# Patient Record
Sex: Male | Born: 1935 | Race: White | Hispanic: No | Smoking: Never smoker
Health system: Southern US, Community
[De-identification: ages and names within clinical notes are randomized; demographics above are authoritative.]

## PROBLEM LIST (undated history)

## (undated) DIAGNOSIS — F32A Depression, unspecified: Secondary | ICD-10-CM

## (undated) DIAGNOSIS — F419 Anxiety disorder, unspecified: Secondary | ICD-10-CM

## (undated) DIAGNOSIS — M1990 Unspecified osteoarthritis, unspecified site: Secondary | ICD-10-CM

## (undated) DIAGNOSIS — E119 Type 2 diabetes mellitus without complications: Secondary | ICD-10-CM

## (undated) DIAGNOSIS — I059 Rheumatic mitral valve disease, unspecified: Secondary | ICD-10-CM

## (undated) DIAGNOSIS — E785 Hyperlipidemia, unspecified: Secondary | ICD-10-CM

## (undated) DIAGNOSIS — H409 Unspecified glaucoma: Secondary | ICD-10-CM

## (undated) DIAGNOSIS — I739 Peripheral vascular disease, unspecified: Secondary | ICD-10-CM

## (undated) DIAGNOSIS — F329 Major depressive disorder, single episode, unspecified: Secondary | ICD-10-CM

## (undated) DIAGNOSIS — I469 Cardiac arrest, cause unspecified: Secondary | ICD-10-CM

## (undated) DIAGNOSIS — D649 Anemia, unspecified: Secondary | ICD-10-CM

## (undated) DIAGNOSIS — I509 Heart failure, unspecified: Secondary | ICD-10-CM

## (undated) DIAGNOSIS — M792 Neuralgia and neuritis, unspecified: Secondary | ICD-10-CM

## (undated) DIAGNOSIS — I2510 Atherosclerotic heart disease of native coronary artery without angina pectoris: Secondary | ICD-10-CM

## (undated) DIAGNOSIS — I10 Essential (primary) hypertension: Secondary | ICD-10-CM

## (undated) HISTORY — DX: Unspecified glaucoma: H40.9

## (undated) HISTORY — PX: MITRAL VALVE ANNULOPLASTY: SHX2038

## (undated) HISTORY — DX: Anemia, unspecified: D64.9

## (undated) HISTORY — DX: Essential (primary) hypertension: I10

## (undated) HISTORY — DX: Major depressive disorder, single episode, unspecified: F32.9

## (undated) HISTORY — DX: Unspecified osteoarthritis, unspecified site: M19.90

## (undated) HISTORY — DX: Hyperlipidemia, unspecified: E78.5

## (undated) HISTORY — PX: CORONARY ARTERY BYPASS GRAFT: SHX141

## (undated) HISTORY — PX: CHOLECYSTECTOMY: SHX55

## (undated) HISTORY — DX: Rheumatic mitral valve disease, unspecified: I05.9

## (undated) HISTORY — DX: Type 2 diabetes mellitus without complications: E11.9

## (undated) HISTORY — DX: Heart failure, unspecified: I50.9

## (undated) HISTORY — DX: Cardiac arrest, cause unspecified: I46.9

## (undated) HISTORY — DX: Depression, unspecified: F32.A

## (undated) HISTORY — DX: Atherosclerotic heart disease of native coronary artery without angina pectoris: I25.10

## (undated) HISTORY — DX: Anxiety disorder, unspecified: F41.9

## (undated) HISTORY — PX: CORONARY ANGIOPLASTY WITH STENT PLACEMENT: SHX49

## (undated) HISTORY — DX: Neuralgia and neuritis, unspecified: M79.2

## (undated) HISTORY — DX: Peripheral vascular disease, unspecified: I73.9

---

## 1998-05-25 ENCOUNTER — Inpatient Hospital Stay (HOSPITAL_COMMUNITY): Admission: RE | Admit: 1998-05-25 | Discharge: 1998-06-01 | Payer: Self-pay | Admitting: Cardiothoracic Surgery

## 2005-02-03 ENCOUNTER — Ambulatory Visit: Payer: Self-pay | Admitting: Internal Medicine

## 2005-02-03 ENCOUNTER — Inpatient Hospital Stay: Payer: Self-pay | Admitting: Surgery

## 2007-06-27 ENCOUNTER — Ambulatory Visit: Payer: Self-pay | Admitting: Rheumatology

## 2008-12-15 ENCOUNTER — Ambulatory Visit: Payer: Self-pay | Admitting: General Practice

## 2009-02-16 ENCOUNTER — Ambulatory Visit: Payer: Self-pay | Admitting: Podiatry

## 2009-03-18 ENCOUNTER — Ambulatory Visit: Payer: Self-pay | Admitting: Cardiology

## 2009-03-21 ENCOUNTER — Inpatient Hospital Stay: Payer: Self-pay | Admitting: Internal Medicine

## 2009-12-22 ENCOUNTER — Emergency Department: Payer: Self-pay | Admitting: Emergency Medicine

## 2009-12-31 ENCOUNTER — Ambulatory Visit: Payer: Self-pay | Admitting: Internal Medicine

## 2010-01-26 ENCOUNTER — Ambulatory Visit: Payer: Self-pay | Admitting: Internal Medicine

## 2010-02-26 ENCOUNTER — Ambulatory Visit: Payer: Self-pay | Admitting: Internal Medicine

## 2010-11-16 ENCOUNTER — Ambulatory Visit: Payer: Self-pay | Admitting: Podiatry

## 2011-05-04 ENCOUNTER — Inpatient Hospital Stay: Payer: Self-pay | Admitting: Cardiology

## 2011-06-02 ENCOUNTER — Encounter: Payer: Self-pay | Admitting: Cardiology

## 2011-06-29 ENCOUNTER — Encounter: Payer: Self-pay | Admitting: Cardiology

## 2011-07-30 ENCOUNTER — Encounter: Payer: Self-pay | Admitting: Cardiology

## 2011-08-29 ENCOUNTER — Encounter: Payer: Self-pay | Admitting: Cardiology

## 2011-09-29 ENCOUNTER — Encounter: Payer: Self-pay | Admitting: Cardiology

## 2011-12-21 ENCOUNTER — Ambulatory Visit: Payer: Self-pay | Admitting: Ophthalmology

## 2012-01-25 ENCOUNTER — Ambulatory Visit: Payer: Self-pay | Admitting: Ophthalmology

## 2012-03-19 ENCOUNTER — Emergency Department: Payer: Self-pay | Admitting: Emergency Medicine

## 2013-11-06 ENCOUNTER — Ambulatory Visit: Payer: Self-pay | Admitting: Podiatry

## 2013-12-11 ENCOUNTER — Telehealth: Payer: Self-pay | Admitting: *Deleted

## 2013-12-11 MED ORDER — CLINDAMYCIN HCL 150 MG PO CAPS: 150.0000 mg | ORAL_CAPSULE | Freq: Three times a day (TID) | ORAL | Status: DC

## 2013-12-11 NOTE — Telephone Encounter (Signed)
[facility]ax request from [facility] requesting clindamycin #[phone] refills take one capsule three times a day for two weeks.

## 2014-03-26 DIAGNOSIS — I2510 Atherosclerotic heart disease of native coronary artery without angina pectoris: Secondary | ICD-10-CM | POA: Insufficient documentation

## 2014-03-26 DIAGNOSIS — I739 Peripheral vascular disease, unspecified: Secondary | ICD-10-CM | POA: Insufficient documentation

## 2014-04-08 ENCOUNTER — Other Ambulatory Visit: Payer: Self-pay | Admitting: Podiatry

## 2014-04-10 ENCOUNTER — Other Ambulatory Visit: Payer: Self-pay | Admitting: *Deleted

## 2014-04-10 MED ORDER — CLINDAMYCIN HCL 150 MG PO CAPS: 150.0000 mg | ORAL_CAPSULE | Freq: Three times a day (TID) | ORAL | Status: DC

## 2014-04-10 NOTE — Telephone Encounter (Signed)
Is this ok to refill?  

## 2014-04-10 NOTE — Telephone Encounter (Signed)
Test

## 2014-04-10 NOTE — Telephone Encounter (Signed)
bton pt

## 2014-04-11 NOTE — Telephone Encounter (Signed)
Lennox LaityJodi,  Please see if Dr Al CorpusHyatt wants to refill this.  Thanks   [employer]ery

## 2014-04-15 ENCOUNTER — Ambulatory Visit: Payer: Self-pay | Admitting: Internal Medicine

## 2014-04-30 ENCOUNTER — Encounter: Payer: Self-pay | Admitting: Internal Medicine

## 2014-05-28 ENCOUNTER — Other Ambulatory Visit: Payer: Self-pay | Admitting: Podiatry

## 2014-05-28 NOTE — Telephone Encounter (Signed)
Pt needs an appt, if currently having problem with an infection and prior to refills.

## 2014-07-01 ENCOUNTER — Ambulatory Visit (INDEPENDENT_AMBULATORY_CARE_PROVIDER_SITE_OTHER): Payer: Medicare Other | Admitting: Internal Medicine

## 2014-07-01 ENCOUNTER — Encounter: Payer: Self-pay | Admitting: Internal Medicine

## 2014-07-01 VITALS — BP 118/70 | HR 86 | Ht 68.0 in | Wt 232.2 lb

## 2014-07-01 DIAGNOSIS — I2510 Atherosclerotic heart disease of native coronary artery without angina pectoris: Secondary | ICD-10-CM

## 2014-07-01 DIAGNOSIS — K589 Irritable bowel syndrome without diarrhea: Secondary | ICD-10-CM

## 2014-07-01 DIAGNOSIS — K219 Gastro-esophageal reflux disease without esophagitis: Secondary | ICD-10-CM

## 2014-07-01 DIAGNOSIS — Z1211 Encounter for screening for malignant neoplasm of colon: Secondary | ICD-10-CM

## 2014-07-01 NOTE — Progress Notes (Signed)
HISTORY OF PRESENT ILLNESS:  Jeffrey Mills is a 78 y.o. male with multiple significant medical problems including, but not limited to, coronary artery disease status post CABG with mitral valve repair, long-standing insulin dependent diabetes mellitus with neuropathy, history of congestive heart failure, history of coronary artery stent placement 2010 and again 2012 complicated by cardiopulmonary arrest. He is on chronic Plavix therapy. Chronic problems with dizziness and shortness of breath with minimal exertion. He is sent here today by his primary care provider Dr. Hyacinth Meeker regarding screening colonoscopy. The patient has been encouraged to have this over the years, but has not. Recently, it was suggested by his endocrinologist. He discussed this with his primary provider. The conclusion was "is probably a good idea". Patient has not had prior colonoscopy. Review of outside records includes an office note from Dr. Hyacinth Meeker 03/26/2014. Blood work at that time was unremarkable. Hemoglobin 13.0 with normal MCV. Normal comprehensive metabolic panel except for elevated glucose. The patient's GI review of systems is remarkable for chronic GERD without dysphagia for which he takes daily PPI. Good control of symptoms on medication. Next, he reports several year history of postprandial urgency with loose stools. For this he has been prescribed Levsin. GI review of systems is otherwise negative. He denies melena or hematochezia. No family history of colon cancer.  REVIEW OF SYSTEMS:  All non-GI ROS negative except for anxiety, arthritis, back pain, visual difficulties, depression, fatigue, irregular heart rate, heart murmur, itching, shortness of breath, ankle edema, skin rash, increased thirst, excessive urination  Past Medical History  Diagnosis Date  . Diabetes   . Benign hypertension   . Mitral valve disorder   . Anemia   . Hyperlipidemia   . CHF (congestive heart failure)   . Neuralgia   . Coronary  atherosclerosis of native coronary artery   . Cardiopulmonary arrest   . PVD (peripheral vascular disease)   . Anxiety   . Arthritis   . Depression   . Glaucoma     Past Surgical History  Procedure Laterality Date  . Coronary artery bypass graft    . Mitral valve annuloplasty    . Cholecystectomy    . Coronary angioplasty with stent placement      Social History JQUAN EGELSTON  reports that he has never smoked. He has never used smokeless tobacco. He reports that he does not drink alcohol or use illicit drugs.  family history includes ALS in his father; Breast cancer in his mother; Diabetes in his son; Heart attack in his paternal uncle; Heart disease in his father and son; Hypertension in his mother.  Allergies  Allergen Reactions  . Captopril   . Morphine And Related   . Penicillins        PHYSICAL EXAMINATION: Vital signs: BP 118/70  Pulse 86  Ht 5\' 8"  (1.727 m)  Wt 232 lb 4 oz (105.348 kg)  BMI 35.32 kg/m2  Constitutional: Frail elderly male, chronically ill-appearing, no acute distress Psychiatric: alert and oriented x3, cooperative Eyes: extraocular movements intact, anicteric, conjunctiva pink Mouth: oral pharynx moist, no lesions Neck: supple no lymphadenopathy Cardiovascular: heart regular rate and rhythm, no murmur Lungs: clear to auscultation bilaterally Abdomen: soft, nontender, nondistended, no obvious ascites, no peritoneal signs, normal bowel sounds, no organomegaly Rectal: Omitted Extremities: Trace lower extremity edema bilaterally Skin: no lesions on visible extremities Neuro: No focal deficits.     ASSESSMENT:  #1. Colon cancer screening. Poor candidate for optical colonoscopy given his age and comorbidities. The  risk is too high. We did discuss several alternatives. See below #2. Chronic GERD without alarm features. Symptoms controlled with PPI #3. IBS-type symptoms. Managed with antispasmodics as needed #4. MULTIPLE significant medical  problems   PLAN:  #1.Cologard as a screening tool for advanced neoplasia of the colon. If this is negative, no further screening strategies recommended in this 78 year old with advanced comorbidities. If positive, would. proceed to virtual colonoscopy to exclude significant lesions. The patient understands this strategy and agrees #2. Continue PPI #3. Continue antispasmodics as needed #4. Return to the care of your primary providers regarding management of your chronic medical problems

## 2014-07-01 NOTE — Patient Instructions (Signed)
You will be contacted shortly by [facility] to set up your Cologuard test.

## 2014-07-07 ENCOUNTER — Other Ambulatory Visit: Payer: Self-pay | Admitting: Podiatry

## 2014-07-23 LAB — COLOGUARD: Cologuard: POSITIVE

## 2014-08-11 ENCOUNTER — Telehealth: Payer: Self-pay | Admitting: *Deleted

## 2014-08-11 DIAGNOSIS — I25810 Atherosclerosis of coronary artery bypass graft(s) without angina pectoris: Secondary | ICD-10-CM | POA: Insufficient documentation

## 2014-08-11 DIAGNOSIS — I10 Essential (primary) hypertension: Secondary | ICD-10-CM | POA: Insufficient documentation

## 2014-08-11 NOTE — Telephone Encounter (Signed)
Yes one refill

## 2014-08-11 NOTE — Telephone Encounter (Signed)
Refilled clindamycin per dr Al Corpus.

## 2014-08-11 NOTE — Telephone Encounter (Signed)
Pt is wanting more clindamycin. Is this ok?

## 2014-08-12 ENCOUNTER — Telehealth: Payer: Self-pay

## 2014-08-12 ENCOUNTER — Other Ambulatory Visit: Payer: Self-pay

## 2014-08-12 DIAGNOSIS — K635 Polyp of colon: Secondary | ICD-10-CM

## 2014-08-12 NOTE — Telephone Encounter (Signed)
Pt scheduled for virtual colon at [address] 09/01/14. Pt to arrive there at 7:45am for an 8am appt. Pt to pick up prep at least 3 days prior to exam. Pt aware.

## 2014-08-12 NOTE — Telephone Encounter (Signed)
Message copied by Chrystie Nose on Tue Aug 12, 2014  3:11 PM ------      Message from: Hilarie Fredrickson      Created: Mon Aug 11, 2014  1:38 PM      Regarding: Positive Cologaurd       Bonita Quin,            Please let the patient know that his cologard test returned positive. This means that he could possibly have a significant colon polyp. The next step  for him would be virtual colonoscopy. Please arrange. Please convert to a phone note for the record. Thank you            Dr. Marina Goodell ------

## 2014-08-15 ENCOUNTER — Encounter: Payer: Self-pay | Admitting: Internal Medicine

## 2014-09-01 ENCOUNTER — Ambulatory Visit
Admission: RE | Admit: 2014-09-01 | Discharge: 2014-09-01 | Disposition: A | Discharge status: Home / Self Care | Payer: Medicare Other | Source: Ambulatory Visit | Attending: Internal Medicine | Admitting: Internal Medicine

## 2014-09-01 DIAGNOSIS — K635 Polyp of colon: Secondary | ICD-10-CM

## 2014-09-09 ENCOUNTER — Other Ambulatory Visit: Payer: Self-pay | Admitting: Podiatry

## 2014-11-05 ENCOUNTER — Other Ambulatory Visit: Payer: Self-pay | Admitting: Podiatry

## 2014-11-17 DIAGNOSIS — E782 Mixed hyperlipidemia: Secondary | ICD-10-CM | POA: Insufficient documentation

## 2015-06-04 ENCOUNTER — Encounter: Payer: Self-pay | Admitting: Urgent Care

## 2015-06-04 ENCOUNTER — Emergency Department: Payer: Medicare Other

## 2015-06-04 ENCOUNTER — Emergency Department
Admission: EM | Admit: 2015-06-04 | Discharge: 2015-06-04 | Disposition: A | Discharge status: Home / Self Care | Payer: Medicare Other | Attending: Emergency Medicine | Admitting: Emergency Medicine

## 2015-06-04 DIAGNOSIS — I10 Essential (primary) hypertension: Secondary | ICD-10-CM | POA: Diagnosis not present

## 2015-06-04 DIAGNOSIS — W16212A Fall in (into) filled bathtub causing other injury, initial encounter: Secondary | ICD-10-CM | POA: Diagnosis not present

## 2015-06-04 DIAGNOSIS — S300XXA Contusion of lower back and pelvis, initial encounter: Secondary | ICD-10-CM | POA: Diagnosis not present

## 2015-06-04 DIAGNOSIS — Z880 Allergy status to penicillin: Secondary | ICD-10-CM | POA: Insufficient documentation

## 2015-06-04 DIAGNOSIS — Y9389 Activity, other specified: Secondary | ICD-10-CM | POA: Insufficient documentation

## 2015-06-04 DIAGNOSIS — Y998 Other external cause status: Secondary | ICD-10-CM | POA: Diagnosis not present

## 2015-06-04 DIAGNOSIS — E119 Type 2 diabetes mellitus without complications: Secondary | ICD-10-CM | POA: Diagnosis not present

## 2015-06-04 DIAGNOSIS — Z7982 Long term (current) use of aspirin: Secondary | ICD-10-CM | POA: Insufficient documentation

## 2015-06-04 DIAGNOSIS — S39012A Strain of muscle, fascia and tendon of lower back, initial encounter: Secondary | ICD-10-CM | POA: Diagnosis not present

## 2015-06-04 DIAGNOSIS — Z7902 Long term (current) use of antithrombotics/antiplatelets: Secondary | ICD-10-CM | POA: Diagnosis not present

## 2015-06-04 DIAGNOSIS — Z794 Long term (current) use of insulin: Secondary | ICD-10-CM | POA: Insufficient documentation

## 2015-06-04 DIAGNOSIS — S3992XA Unspecified injury of lower back, initial encounter: Secondary | ICD-10-CM | POA: Diagnosis present

## 2015-06-04 DIAGNOSIS — Z79899 Other long term (current) drug therapy: Secondary | ICD-10-CM | POA: Insufficient documentation

## 2015-06-04 DIAGNOSIS — Y9289 Other specified places as the place of occurrence of the external cause: Secondary | ICD-10-CM | POA: Diagnosis not present

## 2015-06-04 DIAGNOSIS — S20221A Contusion of right back wall of thorax, initial encounter: Secondary | ICD-10-CM

## 2015-06-04 NOTE — ED Notes (Addendum)
Patient presents with c/o lower back pain since last Thursday. Patient reporting that he fell in the bathtub - "I didn't think it was bad until today."  Patient reports that he took a half of a pain pill - states, "I dont normally take them because I sleep the entire next day." Patient with unsteady gait noted in triage - fall risk increased to high.

## 2015-06-04 NOTE — ED Provider Notes (Signed)
[facility] Emergency Department Provider Note  ____________________________________________  Time seen: 7:39 AM  I have reviewed the triage vital signs and the nursing notes.   HISTORY  Chief Complaint Fall and Back Pain   HPI Jeffrey Mills is a 79 y.o. male is here complaining of low back pain. He states he fell last Thursday in the bathtub. He states that he thought he was doing fine until today. The pain has increased with movement and made it difficult to sleep. He states he took a half a pain pill but does not know the name of it. He states he has had times with his back in the past and was [place] in [place] and scheduled for surgery. This surgery need to be canceled due to some new "heart problems". And since then he has not had much problems with his back per patient. He denies any loss of bowel or bladder control. There is no radiation into his legs. And he said no difficulty walking other than causing more pain in his back. At the time of his fall he denies any head injury or loss of consciousness. Currently his pain is 9/10   Past Medical History  Diagnosis Date  . Diabetes   . Benign hypertension   . Mitral valve disorder   . Anemia   . Hyperlipidemia   . CHF (congestive heart failure)   . Neuralgia   . Coronary atherosclerosis of native coronary artery   . Cardiopulmonary arrest   . PVD (peripheral vascular disease)   . Anxiety   . Arthritis   . Depression   . Glaucoma     There are no active problems to display for this patient.   Past Surgical History  Procedure Laterality Date  . Coronary artery bypass graft    . Mitral valve annuloplasty    . Cholecystectomy    . Coronary angioplasty with stent placement      Current Outpatient Rx  Name  Route  Sig  Dispense  Refill  . acetaminophen (TYLENOL) 325 MG tablet   Oral   Take 650 mg by mouth every 6 (six) hours as needed.         . ALPRAZolam (XANAX) 0.5 MG  tablet   Oral   Take 0.5 mg by mouth at bedtime as needed for anxiety.         Marland Kitchen. aspirin 81 MG tablet   Oral   Take 81 mg by mouth daily.         . brimonidine (ALPHAGAN) 0.2 % ophthalmic solution   Both Eyes   Place 1 drop into both eyes 2 (two) times daily.         Marland Kitchen. buPROPion (WELLBUTRIN XL) 150 MG 24 hr tablet   Oral   Take 150 mg by mouth daily.         . clindamycin (CLEOCIN) 150 MG capsule      TAKE 1 CAPSULE BY MOUTH 3 TIMES DAILY   42 capsule   0     FOR NEXT FILL. THANK YOU   . clopidogrel (PLAVIX) 75 MG tablet   Oral   Take 75 mg by mouth daily.         . hyoscyamine (LEVSIN, ANASPAZ) 0.125 MG tablet   Oral   Take 0.125 mg by mouth 3 (three) times daily as needed.         . insulin lispro protamine-lispro (HUMALOG 75/25 MIX) (75-25) 100 UNIT/ML SUSP injection  Subcutaneous   Inject 38 Units into the skin 2 (two) times daily with a meal.          . insulin regular (NOVOLIN R) 100 units/mL injection   Subcutaneous   Inject into the skin as directed. Sliding Scale dose         . Insulin Syringe-Needle U-100 (INSULIN SYRINGE .3CC/31GX5/16") 31G X 5/16" 0.3 ML MISC   Does not apply   by Does not apply route.         . lansoprazole (PREVACID) 30 MG capsule   Oral   Take 30 mg by mouth daily at 12 noon.         . latanoprost (XALATAN) 0.005 % ophthalmic solution   Both Eyes   Place 1 drop into both eyes 2 (two) times daily.          . Liraglutide (VICTOZA [place])   Subcutaneous   Inject 1.8 mg into the skin daily.          . metFORMIN (GLUCOPHAGE) 500 MG tablet   Oral   Take 500 mg by mouth daily.         . metoprolol succinate (TOPROL-XL) 25 MG 24 hr tablet   Oral   Take 25 mg by mouth daily.         . sertraline (ZOLOFT) 50 MG tablet   Oral   Take 50 mg by mouth daily.         . simvastatin (ZOCOR) 40 MG tablet   Oral   Take 40 mg by mouth daily.           Allergies Captopril; Morphine and related; and  Penicillins  Family History  Problem Relation Age of Onset  . Heart disease Father   . Hypertension Mother   . Heart attack Paternal Uncle   . Breast cancer Mother   . Diabetes Son   . Heart disease Son   . ALS Father     Social History History  Substance Use Topics  . Smoking status: Never Smoker   . Smokeless tobacco: Never Used  . Alcohol Use: No    Review of Systems Constitutional: No fever/chills Eyes: No visual changes. ENT: No sore throat. Cardiovascular: Denies chest pain. Respiratory: Denies shortness of breath. Gastrointestinal: No abdominal pain.  No nausea, no vomiting.  No diarrhea.  No constipation. Genitourinary: Negative for dysuria. Musculoskeletal: Positive for back pain. Skin: Negative for rash. Neurological: Negative for headaches, focal weakness or numbness.  10-point ROS otherwise negative.  ____________________________________________   PHYSICAL EXAM:  VITAL SIGNS: ED Triage Vitals  Enc Vitals Group     BP 06/04/15 0219 124/84 mmHg     Pulse Rate 06/04/15 0219 99     Resp 06/04/15 0219 18     Temp 06/04/15 0219 97.4 F (36.3 C)     Temp Source 06/04/15 0219 Oral     SpO2 06/04/15 0219 96 %     Weight 06/04/15 0219 232 lb (105.235 kg)     Height 06/04/15 0219 5\' 11"  (1.803 m)     Head Cir --      Peak Flow --      Pain Score 06/04/15 0219 9     Pain Loc --      Pain Edu? --      Excl. in GC? --     Constitutional: Alert and oriented. Well appearing and in no acute distress. Eyes: Conjunctivae are normal. PERRL. EOMI. Head: Atraumatic. Nose: No congestion/rhinnorhea. Neck: No  stridor.  No cervical spine tenderness on palpation. Cardiovascular: Normal rate, regular rhythm. Grossly normal heart sounds.  Good peripheral circulation. Respiratory: Normal respiratory effort.  No retractions. Lungs CTAB. Gastrointestinal: Soft and nontender. No distention.. Musculoskeletal: Back exam no gross deformity noted. Moderate tenderness on  palpation of the L5-S1 paravertebral muscles bilaterally. Range of motion is slightly restricted secondary to pain. There is good muscle strength bilateral legs. Straight leg raises are approximately 70 bilaterally with minimal discomfort. No lower extremity tenderness nor edema.  No joint effusions. Neurologic:  Normal speech and language. No gross focal neurologic deficits are appreciated. Speech is normal. No gait instability. Reflexes are 1+ bilaterally Skin:  Skin is warm, dry and intact. No rash noted. No ecchymosis or abrasions noted Psychiatric: Mood and affect are normal. Speech and behavior are normal.  ____________________________________________   LABS (all labs ordered are listed, but only abnormal results are displayed)  Labs Reviewed - No data to display   RADIOLOGY  X-ray lumbar spine shows mild degenerative disc disease changes at L4-5 and unchanged prior to previous studies per radiologist I, Tommi Rumps, personally viewed and evaluated these images as part of my medical decision making.  ____________________________________________   PROCEDURES  Procedure(s) performed: None  Critical Care performed: No  ____________________________________________   INITIAL IMPRESSION / ASSESSMENT AND PLAN / ED COURSE  Pertinent labs & imaging results that were available during my care of the patient were reviewed by me and considered in my medical decision making (see chart for details  patient will continue taking his pain medication prescribed by his doctor in [place]. He is to follow-up with his doctor if any continued problems. He was also cautioned that his medication may also stress numbness and increase his risk for fall.____________________________________________   FINAL CLINICAL IMPRESSION(S) / ED DIAGNOSES  Final diagnoses:  Low back strain, initial encounter  Contusion, back, right, initial encounter      Tommi Rumps, PA-C 06/04/15  1141  Sharman Cheek, MD 06/04/15 1535

## 2015-06-04 NOTE — Discharge Instructions (Signed)
° °  CONTINUE YOUR REGULAR MEDICATION AND FOLLOW UP WITH YOUR DOCTOR AS NEEDED ICE TO BACK OR MOIST HEAT AS NEEDED  TAKE 1/2 TABLET OF YOUR PAIN MEDICATION AND 1 TABLET AT BEDTIME AS NEEDED FOR PAIN

## 2015-12-08 DIAGNOSIS — E1121 Type 2 diabetes mellitus with diabetic nephropathy: Secondary | ICD-10-CM | POA: Insufficient documentation

## 2016-04-11 DIAGNOSIS — F3341 Major depressive disorder, recurrent, in partial remission: Secondary | ICD-10-CM | POA: Insufficient documentation

## 2016-08-16 DIAGNOSIS — E1140 Type 2 diabetes mellitus with diabetic neuropathy, unspecified: Secondary | ICD-10-CM | POA: Insufficient documentation

## 2017-03-20 DIAGNOSIS — I340 Nonrheumatic mitral (valve) insufficiency: Secondary | ICD-10-CM | POA: Insufficient documentation

## 2017-04-19 DIAGNOSIS — Z0000 Encounter for general adult medical examination without abnormal findings: Secondary | ICD-10-CM | POA: Insufficient documentation

## 2017-07-24 ENCOUNTER — Emergency Department
Admission: EM | Admit: 2017-07-24 | Discharge: 2017-07-24 | Disposition: A | Discharge status: Home / Self Care | Payer: Medicare Other | Attending: Emergency Medicine | Admitting: Emergency Medicine

## 2017-07-24 DIAGNOSIS — F419 Anxiety disorder, unspecified: Secondary | ICD-10-CM | POA: Diagnosis not present

## 2017-07-24 DIAGNOSIS — R0602 Shortness of breath: Secondary | ICD-10-CM | POA: Diagnosis not present

## 2017-07-24 DIAGNOSIS — Z7901 Long term (current) use of anticoagulants: Secondary | ICD-10-CM | POA: Diagnosis not present

## 2017-07-24 DIAGNOSIS — I509 Heart failure, unspecified: Secondary | ICD-10-CM | POA: Diagnosis not present

## 2017-07-24 DIAGNOSIS — R451 Restlessness and agitation: Secondary | ICD-10-CM | POA: Diagnosis present

## 2017-07-24 DIAGNOSIS — Z7982 Long term (current) use of aspirin: Secondary | ICD-10-CM | POA: Insufficient documentation

## 2017-07-24 DIAGNOSIS — I110 Hypertensive heart disease with heart failure: Secondary | ICD-10-CM | POA: Insufficient documentation

## 2017-07-24 DIAGNOSIS — Z79899 Other long term (current) drug therapy: Secondary | ICD-10-CM | POA: Insufficient documentation

## 2017-07-24 DIAGNOSIS — E119 Type 2 diabetes mellitus without complications: Secondary | ICD-10-CM | POA: Diagnosis not present

## 2017-07-24 DIAGNOSIS — I2510 Atherosclerotic heart disease of native coronary artery without angina pectoris: Secondary | ICD-10-CM | POA: Diagnosis not present

## 2017-07-24 DIAGNOSIS — Z794 Long term (current) use of insulin: Secondary | ICD-10-CM | POA: Insufficient documentation

## 2017-07-24 LAB — CBC WITH DIFFERENTIAL/PLATELET
BASOS PCT: 0 %
Basophils Absolute: 0 10*3/uL (ref 0–0.1)
EOS ABS: 0.2 10*3/uL (ref 0–0.7)
EOS PCT: 3 %
HCT: 34.2 % — ABNORMAL LOW (ref 40.0–52.0)
Hemoglobin: 11.9 g/dL — ABNORMAL LOW (ref 13.0–18.0)
Lymphocytes Relative: 24 %
Lymphs Abs: 1.7 10*3/uL (ref 1.0–3.6)
MCH: 34.7 pg — ABNORMAL HIGH (ref 26.0–34.0)
MCHC: 34.7 g/dL (ref 32.0–36.0)
MCV: 99.9 fL (ref 80.0–100.0)
MONO ABS: 0.7 10*3/uL (ref 0.2–1.0)
MONOS PCT: 9 %
NEUTROS PCT: 64 %
Neutro Abs: 4.5 10*3/uL (ref 1.4–6.5)
PLATELETS: 237 10*3/uL (ref 150–440)
RBC: 3.42 MIL/uL — ABNORMAL LOW (ref 4.40–5.90)
RDW: 15.2 % — AB (ref 11.5–14.5)
WBC: 7.1 10*3/uL (ref 3.8–10.6)

## 2017-07-24 LAB — BASIC METABOLIC PANEL
Anion gap: 9 (ref 5–15)
BUN: 23 mg/dL — AB (ref 6–20)
CO2: 23 mmol/L (ref 22–32)
Calcium: 9.4 mg/dL (ref 8.9–10.3)
Chloride: 107 mmol/L (ref 101–111)
Creatinine, Ser: 1.37 mg/dL — ABNORMAL HIGH (ref 0.61–1.24)
GFR calc Af Amer: 55 mL/min — ABNORMAL LOW (ref >60)
GFR, EST NON AFRICAN AMERICAN: 47 mL/min — AB (ref >60)
GLUCOSE: 179 mg/dL — AB (ref 65–99)
POTASSIUM: 4.7 mmol/L (ref 3.5–5.1)
Sodium: 139 mmol/L (ref 135–145)

## 2017-07-24 LAB — CK: Total CK: 97 U/L (ref 49–397)

## 2017-07-24 LAB — HEPATIC FUNCTION PANEL
ALK PHOS: 63 U/L (ref 38–126)
ALT: 26 U/L (ref 17–63)
AST: 35 U/L (ref 15–41)
Albumin: 4.3 g/dL (ref 3.5–5.0)
BILIRUBIN TOTAL: 0.7 mg/dL (ref 0.3–1.2)
Total Protein: 7.9 g/dL (ref 6.5–8.1)

## 2017-07-24 LAB — TROPONIN I: Troponin I: <0.03 ng/mL (ref <0.03)

## 2017-07-24 LAB — GLUCOSE, CAPILLARY: Glucose-Capillary: 174 mg/dL — ABNORMAL HIGH (ref 65–99)

## 2017-07-24 LAB — TSH: TSH: 3.742 u[IU]/mL (ref 0.350–4.500)

## 2017-07-24 LAB — T4, FREE: FREE T4: 0.72 ng/dL (ref 0.61–1.12)

## 2017-07-24 NOTE — ED Provider Notes (Signed)
[facility] Emergency Department Provider Note  ____________________________________________   First MD Initiated Contact with Patient 07/24/17 [phone]     (approximate)  I have reviewed the triage vital signs and the nursing notes.   HISTORY  Chief Complaint Panic Attack    HPI Jeffrey Mills is a 81 y.o. male who self presents to the emergency department with 4-5 days of nightly anxiety. He has never been formally diagnosed with panic attacks but says he thinks that that is what these are. Round 4 to 5 PM he begins to feel gradual onset nervousness and anxiety and a feeling of restlessness. He said the only thing that seems to help is getting in his car and driving. He has previously been prescribed alprazolam to help him sleep but he does not like taking it as he feels like it makes him groggy the next day. He has had no recent change in his medications aside from stopping Plavix in beginning eliquis. He has had some recent life stressors with a death in the family as well as 2 sick family members. His symptoms of his slowly progressive. There are improved during the day.   Past Medical History:  Diagnosis Date  . Anemia   . Anxiety   . Arthritis   . Benign hypertension   . Cardiopulmonary arrest (HCC)   . CHF (congestive heart failure) (HCC)   . Coronary atherosclerosis of native coronary artery   . Depression   . Diabetes (HCC)   . Glaucoma   . Hyperlipidemia   . Mitral valve disorder   . Neuralgia   . PVD (peripheral vascular disease) (HCC)     There are no active problems to display for this patient.   Past Surgical History:  Procedure Laterality Date  . CHOLECYSTECTOMY    . CORONARY ANGIOPLASTY WITH STENT PLACEMENT    . CORONARY ARTERY BYPASS GRAFT    . MITRAL VALVE ANNULOPLASTY      Prior to Admission medications   Medication Sig Start Date End Date Taking? Authorizing Provider  apixaban (ELIQUIS) 2.5 MG TABS tablet Take 2.5 mg by  mouth 2 (two) times daily.   Yes [provider]  acetaminophen (TYLENOL) 325 MG tablet Take 650 mg by mouth every 6 (six) hours as needed.    [provider]  ALPRAZolam Prudy Feeler) 0.5 MG tablet Take 0.5 mg by mouth at bedtime as needed for anxiety.    [provider]  aspirin 81 MG tablet Take 81 mg by mouth daily.    [provider]  brimonidine (ALPHAGAN) 0.2 % ophthalmic solution Place 1 drop into both eyes 2 (two) times daily.    [provider]  buPROPion (WELLBUTRIN XL) 150 MG 24 hr tablet Take 150 mg by mouth daily.    [provider]  clindamycin (CLEOCIN) 150 MG capsule TAKE 1 CAPSULE BY MOUTH 3 TIMES DAILY 11/24/14   Hyatt, Max T, DPM  clopidogrel (PLAVIX) 75 MG tablet Take 75 mg by mouth daily.    [provider]  hyoscyamine (LEVSIN, ANASPAZ) 0.125 MG tablet Take 0.125 mg by mouth 3 (three) times daily as needed.    [provider]  insulin lispro protamine-lispro (HUMALOG 75/25 MIX) (75-25) 100 UNIT/ML SUSP injection Inject 38 Units into the skin 2 (two) times daily with a meal.     [provider]  insulin regular (NOVOLIN R) 100 units/mL injection Inject into the skin as directed. Sliding Scale dose    [provider]  Insulin Syringe-Needle U-100 (INSULIN SYRINGE .3CC/31GX5/16") 31G X 5/16" 0.3 ML MISC by Does not apply route.    [provider]  lansoprazole (PREVACID) 30 MG capsule Take 30 mg by mouth daily at 12 noon.    [provider]  latanoprost (XALATAN) 0.005 % ophthalmic solution Place 1 drop into both eyes 2 (two) times daily.  05/28/14   [provider]  Liraglutide (VICTOZA [place]) Inject 1.8 mg into the skin daily.     [provider]  metFORMIN (GLUCOPHAGE) 500 MG tablet Take 500 mg by mouth daily.    [provider]  metoprolol succinate (TOPROL-XL) 25 MG 24 hr tablet Take 25 mg by mouth daily.    [provider]  sertraline (ZOLOFT)  50 MG tablet Take 50 mg by mouth daily.    [provider]  simvastatin (ZOCOR) 40 MG tablet Take 40 mg by mouth daily.    [provider]    Allergies Captopril; Morphine and related; and Penicillins  Family History  Problem Relation Age of Onset  . Heart disease Father   . ALS Father   . Hypertension Mother   . Breast cancer Mother   . Heart attack Paternal Uncle   . Diabetes Son   . Heart disease Son     Social History Social History  Substance Use Topics  . Smoking status: Never Smoker  . Smokeless tobacco: Never Used  . Alcohol use No    Review of Systems Constitutional: No fever/chills ENT: No sore throat. Cardiovascular: Denies chest pain. Respiratory: Positive shortness of breath. Gastrointestinal: No abdominal pain.  No nausea, no vomiting.  No diarrhea.  No constipation. Musculoskeletal: Negative for back pain. Neurological: Negative for headaches   ____________________________________________   PHYSICAL EXAM:  VITAL SIGNS: ED Triage Vitals  Enc Vitals Group     BP 07/24/17 0153 (!) 152/77     Pulse Rate 07/24/17 0153 85     Resp 07/24/17 0153 20     Temp 07/24/17 0153 97.9 F (36.6 C)     Temp Source 07/24/17 0153 Oral     SpO2 07/24/17 0153 99 %     Weight 07/24/17 0154 234 lb (106.1 kg)     Height 07/24/17 0154 5\' 10"  (1.778 m)     Head Circumference --      Peak Flow --      Pain Score --      Pain Loc --      Pain Edu? --      Excl. in GC? --     Constitutional: Alert and oriented 4 appears anxious and pacing around the room wringing his hands Head: Atraumatic. Nose: No congestion/rhinnorhea. Mouth/Throat: No trismus Neck: No stridor.   Cardiovascular: Irregularly irregular normal murmurs Respiratory: Normal respiratory effort.  No retractions. Gastrointestinal: Soft nontender Neurologic:  Normal speech and language. No gross focal neurologic deficits are appreciated.  Skin:  Skin is warm, dry and intact. No rash  noted.    ____________________________________________  LABS (all labs ordered are listed, but only abnormal results are displayed)  Labs Reviewed  BASIC METABOLIC PANEL - Abnormal; Notable for the following:       Result Value   Glucose, Bld 179 (*)    BUN 23 (*)    Creatinine, Ser 1.37 (*)    GFR calc non Af Amer 47 (*)    GFR calc Af Amer 55 (*)    All other components within normal limits  HEPATIC FUNCTION  PANEL - Abnormal; Notable for the following:    Bilirubin, Direct <0.1 (*)    All other components within normal limits  CBC WITH DIFFERENTIAL/PLATELET - Abnormal; Notable for the following:    RBC 3.42 (*)    Hemoglobin 11.9 (*)    HCT 34.2 (*)    MCH 34.7 (*)    RDW 15.2 (*)    All other components within normal limits  GLUCOSE, CAPILLARY - Abnormal; Notable for the following:    Glucose-Capillary 174 (*)    All other components within normal limits  TROPONIN I  TSH  T4, FREE  CK    Blood work unremarkable no signs of acute ischemia __________________________________________  EKG  ED ECG REPORT I, Merrily Brittle, the attending physician, personally viewed and interpreted this ECG.  Date: 07/24/2017 EKG Time:  Rate: 75 Rhythm: normal sinus rhythm QRS Axis: normal Intervals: normal ST/T Wave abnormalities: normal Narrative Interpretation: Incomplete right bundle branch block, no signs of acute ischemia  ____________________________________________  RADIOLOGY   ____________________________________________   PROCEDURES  Procedure(s) performed: no  Procedures  Critical Care performed: no  Observation: no ____________________________________________   INITIAL IMPRESSION / ASSESSMENT AND PLAN / ED COURSE  Pertinent labs & imaging results that were available during my care of the patient were reviewed by me and considered in my medical decision making (see chart for details).  The patient arrives quite nervous appearing with 4-5 days of  new onset anxiety. As this is new for him a roll evaluate for medical etiology of his symptoms including ischemia and thyroid disease.  Fortunately the patient's blood work is unremarkable. I've advised him to establish care with a therapist tomorrow for reevaluation and to begin treatment with medications which could prevent his anxiety from ever beginning.      ____________________________________________   FINAL CLINICAL IMPRESSION(S) / ED DIAGNOSES  Final diagnoses:  Anxiety      NEW MEDICATIONS STARTED DURING THIS VISIT:  New Prescriptions   No medications on file     Note:  This document was prepared using Dragon voice recognition software and may include unintentional dictation errors.      Merrily Brittle, MD 07/24/17 [phone]

## 2017-07-24 NOTE — ED Triage Notes (Signed)
Patient c/o panic attacks X 1 week.  Patient reports he is unable to sleep, and the only thing that helps to calm him is driving. Patient denies chest pain, SOB or any other symptoms. Pt has prescription for Xanax prn at bedtime for anxiety, however does not take because he feels it makes him "too sleepy the next day".

## 2017-07-24 NOTE — Discharge Instructions (Signed)
Please follow-up to see a therapist this week for reevaluation. Return to the emergency department for any concerns.  It was a pleasure to take care of you today, and thank you for coming to our emergency department.  If you have any questions or concerns before leaving please ask the nurse to grab me and I'm more than happy to go through your aftercare instructions again.  If you were prescribed any opioid pain medication today such as Norco, Vicodin, Percocet, morphine, hydrocodone, or oxycodone please make sure you do not drive when you are taking this medication as it can alter your ability to drive safely.  If you have any concerns once you are home that you are not improving or are in fact getting worse before you can make it to your follow-up appointment, please do not hesitate to call 911 and come back for further evaluation.  Merrily Brittle, MD  Results for orders placed or performed during the hospital encounter of 07/24/17  Basic metabolic panel  Result Value Ref Range   Sodium 139 135 - 145 mmol/L   Potassium 4.7 3.5 - 5.1 mmol/L   Chloride 107 101 - 111 mmol/L   CO2 23 22 - 32 mmol/L   Glucose, Bld 179 (H) 65 - 99 mg/dL   BUN 23 (H) 6 - 20 mg/dL   Creatinine, Ser 6.54 (H) 0.61 - 1.24 mg/dL   Calcium 9.4 8.9 - 65.0 mg/dL   GFR calc non Af Amer 47 (L) >60 mL/min   GFR calc Af Amer 55 (L) >60 mL/min   Anion gap 9 5 - 15  Hepatic function panel  Result Value Ref Range   Total Protein 7.9 6.5 - 8.1 g/dL   Albumin 4.3 3.5 - 5.0 g/dL   AST 35 15 - 41 U/L   ALT 26 17 - 63 U/L   Alkaline Phosphatase 63 38 - 126 U/L   Total Bilirubin 0.7 0.3 - 1.2 mg/dL   Bilirubin, Direct <3.5 (L) 0.1 - 0.5 mg/dL   Indirect Bilirubin NOT CALCULATED 0.3 - 0.9 mg/dL  Troponin I  Result Value Ref Range   Troponin I <0.03 <0.03 ng/mL  CBC with Differential  Result Value Ref Range   WBC 7.1 3.8 - 10.6 K/uL   RBC 3.42 (L) 4.40 - 5.90 MIL/uL   Hemoglobin 11.9 (L) 13.0 - 18.0 g/dL   HCT 46.5 (L)  68.1 - 52.0 %   MCV 99.9 80.0 - 100.0 fL   MCH 34.7 (H) 26.0 - 34.0 pg   MCHC 34.7 32.0 - 36.0 g/dL   RDW 27.5 (H) 17.0 - 01.7 %   Platelets 237 150 - 440 K/uL   Neutrophils Relative % 64 %   Neutro Abs 4.5 1.4 - 6.5 K/uL   Lymphocytes Relative 24 %   Lymphs Abs 1.7 1.0 - 3.6 K/uL   Monocytes Relative 9 %   Monocytes Absolute 0.7 0.2 - 1.0 K/uL   Eosinophils Relative 3 %   Eosinophils Absolute 0.2 0 - 0.7 K/uL   Basophils Relative 0 %   Basophils Absolute 0.0 0 - 0.1 K/uL  TSH  Result Value Ref Range   TSH 3.742 0.350 - 4.500 uIU/mL  T4, free  Result Value Ref Range   Free T4 0.72 0.61 - 1.12 ng/dL  CK  Result Value Ref Range   Total CK 97 49 - 397 U/L  Glucose, capillary  Result Value Ref Range   Glucose-Capillary 174 (H) 65 - 99 mg/dL

## 2017-07-24 NOTE — ED Notes (Signed)
Reviewed d/c instructions, follow-up care with patient. Pt verbalized understanding.  

## 2017-12-21 DIAGNOSIS — E538 Deficiency of other specified B group vitamins: Secondary | ICD-10-CM | POA: Insufficient documentation

## 2018-12-02 ENCOUNTER — Inpatient Hospital Stay
Admission: EM | Admit: 2018-12-02 | Discharge: 2018-12-07 | DRG: 872 | Disposition: A | Discharge status: Home Health | Payer: Medicare Other | Attending: Internal Medicine | Admitting: Internal Medicine

## 2018-12-02 DIAGNOSIS — Z794 Long term (current) use of insulin: Secondary | ICD-10-CM

## 2018-12-02 DIAGNOSIS — Z955 Presence of coronary angioplasty implant and graft: Secondary | ICD-10-CM

## 2018-12-02 DIAGNOSIS — R509 Fever, unspecified: Secondary | ICD-10-CM

## 2018-12-02 DIAGNOSIS — A419 Sepsis, unspecified organism: Secondary | ICD-10-CM

## 2018-12-02 DIAGNOSIS — Z7984 Long term (current) use of oral hypoglycemic drugs: Secondary | ICD-10-CM

## 2018-12-02 DIAGNOSIS — R351 Nocturia: Secondary | ICD-10-CM | POA: Diagnosis present

## 2018-12-02 DIAGNOSIS — E785 Hyperlipidemia, unspecified: Secondary | ICD-10-CM | POA: Diagnosis present

## 2018-12-02 DIAGNOSIS — I2510 Atherosclerotic heart disease of native coronary artery without angina pectoris: Secondary | ICD-10-CM | POA: Diagnosis present

## 2018-12-02 DIAGNOSIS — Z79899 Other long term (current) drug therapy: Secondary | ICD-10-CM

## 2018-12-02 DIAGNOSIS — E1165 Type 2 diabetes mellitus with hyperglycemia: Secondary | ICD-10-CM | POA: Diagnosis present

## 2018-12-02 DIAGNOSIS — Z888 Allergy status to other drugs, medicaments and biological substances status: Secondary | ICD-10-CM

## 2018-12-02 DIAGNOSIS — E872 Acidosis, unspecified: Secondary | ICD-10-CM

## 2018-12-02 DIAGNOSIS — Z880 Allergy status to penicillin: Secondary | ICD-10-CM

## 2018-12-02 DIAGNOSIS — N183 Chronic kidney disease, stage 3 (moderate): Secondary | ICD-10-CM | POA: Diagnosis present

## 2018-12-02 DIAGNOSIS — I5032 Chronic diastolic (congestive) heart failure: Secondary | ICD-10-CM | POA: Diagnosis present

## 2018-12-02 DIAGNOSIS — Z833 Family history of diabetes mellitus: Secondary | ICD-10-CM

## 2018-12-02 DIAGNOSIS — Z951 Presence of aortocoronary bypass graft: Secondary | ICD-10-CM

## 2018-12-02 DIAGNOSIS — Z9049 Acquired absence of other specified parts of digestive tract: Secondary | ICD-10-CM

## 2018-12-02 DIAGNOSIS — G8929 Other chronic pain: Secondary | ICD-10-CM | POA: Diagnosis present

## 2018-12-02 DIAGNOSIS — D631 Anemia in chronic kidney disease: Secondary | ICD-10-CM | POA: Diagnosis present

## 2018-12-02 DIAGNOSIS — Z7982 Long term (current) use of aspirin: Secondary | ICD-10-CM

## 2018-12-02 DIAGNOSIS — Z803 Family history of malignant neoplasm of breast: Secondary | ICD-10-CM

## 2018-12-02 DIAGNOSIS — I4891 Unspecified atrial fibrillation: Secondary | ICD-10-CM | POA: Diagnosis present

## 2018-12-02 DIAGNOSIS — Z7902 Long term (current) use of antithrombotics/antiplatelets: Secondary | ICD-10-CM

## 2018-12-02 DIAGNOSIS — Z8249 Family history of ischemic heart disease and other diseases of the circulatory system: Secondary | ICD-10-CM

## 2018-12-02 DIAGNOSIS — Z7901 Long term (current) use of anticoagulants: Secondary | ICD-10-CM

## 2018-12-02 DIAGNOSIS — Z885 Allergy status to narcotic agent status: Secondary | ICD-10-CM

## 2018-12-02 DIAGNOSIS — E1122 Type 2 diabetes mellitus with diabetic chronic kidney disease: Secondary | ICD-10-CM | POA: Diagnosis present

## 2018-12-02 DIAGNOSIS — E1151 Type 2 diabetes mellitus with diabetic peripheral angiopathy without gangrene: Secondary | ICD-10-CM | POA: Diagnosis present

## 2018-12-02 DIAGNOSIS — Z8674 Personal history of sudden cardiac arrest: Secondary | ICD-10-CM

## 2018-12-02 DIAGNOSIS — A401 Sepsis due to streptococcus, group B: Principal | ICD-10-CM | POA: Diagnosis present

## 2018-12-02 DIAGNOSIS — I130 Hypertensive heart and chronic kidney disease with heart failure and stage 1 through stage 4 chronic kidney disease, or unspecified chronic kidney disease: Secondary | ICD-10-CM | POA: Diagnosis present

## 2018-12-02 DIAGNOSIS — R791 Abnormal coagulation profile: Secondary | ICD-10-CM | POA: Diagnosis present

## 2018-12-02 DIAGNOSIS — Z952 Presence of prosthetic heart valve: Secondary | ICD-10-CM

## 2018-12-02 MED ORDER — ONDANSETRON HCL 4 MG/2ML IJ SOLN
INTRAMUSCULAR | Status: AC
  Administered 2018-12-03: 4 mg via INTRAVENOUS
  Filled 2018-12-02: qty 2

## 2018-12-02 NOTE — ED Notes (Signed)
Called EMS for having the chills. Patient had an episode of vomiting in the ambulance en route to the hospital. BS 265, BP 170/80. P106 ST on the monitor. 98.63F. Alert and oriented at this time. 20g left AC

## 2018-12-02 NOTE — ED Provider Notes (Signed)
[facility] Emergency Department Provider Note  ____________________________________________   First MD Initiated Contact with Patient 12/02/18 2351     (approximate)  I have reviewed the triage vital signs and the nursing notes.   HISTORY  Chief Complaint Fever    HPI Ellsworth LennoxLarry W Smock is a 83 y.o. male who comes to the emergency department via EMS with shaking chills nausea vomiting and cramping abdominal discomfort that began acutely at 4:30 PM today roughly 7 hours prior to arrival.  His symptoms came on suddenly and have been severe and nothing seems to make them better or worse.  His symptoms began roughly 1 hour after eating a biscuit from [employer].  He denies chest pain or shortness of breath.  He has a remote cholecystectomy as well as mitral valve replacement for which he takes Coumadin.  He denies dysuria frequency or hesitancy.  He does report generalized malaise.  He denies upper respiratory symptoms.    Past Medical History:  Diagnosis Date  . Anemia   . Anxiety   . Arthritis   . Benign hypertension   . Cardiopulmonary arrest (HCC)   . CHF (congestive heart failure) (HCC)   . Coronary atherosclerosis of native coronary artery   . Depression   . Diabetes (HCC)   . Glaucoma   . Hyperlipidemia   . Mitral valve disorder   . Neuralgia   . PVD (peripheral vascular disease) [facility](HCC)     Patient Active Problem List   Diagnosis Date Noted  . Sepsis (HCC) 12/03/2018    Past Surgical History:  Procedure Laterality Date  . CHOLECYSTECTOMY    . CORONARY ANGIOPLASTY WITH STENT PLACEMENT    . CORONARY ARTERY BYPASS GRAFT    . MITRAL VALVE ANNULOPLASTY      Prior to Admission medications   Medication Sig Start Date End Date Taking? Authorizing Provider  acetaminophen (TYLENOL) 325 MG tablet Take 650 mg by mouth every 6 (six) hours as needed.   Yes [provider]  ALPRAZolam Prudy Feeler(XANAX) 0.5 MG tablet Take 0.5 mg by mouth at bedtime as  needed for anxiety.   Yes [provider]  aspirin 81 MG tablet Take 81 mg by mouth daily.   Yes [provider]  brimonidine (ALPHAGAN) 0.2 % ophthalmic solution Place 1 drop into both eyes 2 (two) times daily.   Yes [provider]  escitalopram (LEXAPRO) 5 MG tablet Take 5 mg by mouth daily.   Yes [provider]  ferrous gluconate (FERGON) 324 MG tablet Take 324 mg by mouth daily with breakfast.   Yes [provider]  gabapentin (NEURONTIN) 100 MG capsule Take 100 mg by mouth 3 (three) times daily.   Yes [provider]  insulin lispro protamine-lispro (HUMALOG 75/25 MIX) (75-25) 100 UNIT/ML SUSP injection Inject 38 Units into the skin 2 (two) times daily with a meal.    Yes [provider]  insulin regular (NOVOLIN R) 100 units/mL injection Inject into the skin 3 (three) times daily before meals. Sliding Scale dose max of 10 units daily   Yes [provider]  latanoprost (XALATAN) 0.005 % ophthalmic solution Place 1 drop into both eyes 2 (two) times daily.  05/28/14  Yes [provider]  meclizine (ANTIVERT) 25 MG tablet Take 25 mg by mouth 3 (three) times daily as needed for dizziness.   Yes [provider]  metFORMIN (GLUCOPHAGE) 500 MG tablet Take 1,000 mg by mouth 2 (two) times daily with a meal.  Yes [provider]  metoprolol succinate (TOPROL-XL) 25 MG 24 hr tablet Take 25 mg by mouth daily.   Yes [provider]  pantoprazole (PROTONIX) 40 MG tablet Take 40 mg by mouth daily.   Yes [provider]  simvastatin (ZOCOR) 40 MG tablet Take 40 mg by mouth daily.   Yes [provider]  triamcinolone cream (KENALOG) 0.1 % Apply 1 application topically 2 (two) times daily.   Yes [provider]  vitamin B-12 (CYANOCOBALAMIN) 500 MCG tablet Take 500 mcg by mouth daily.   Yes [provider]  warfarin (COUMADIN) 5 MG tablet Take 7.5 mg by mouth daily.    Yes [provider]  apixaban (ELIQUIS) 2.5 MG TABS tablet Take 2.5 mg by mouth 2 (two) times daily.    [provider]  buPROPion (WELLBUTRIN XL) 150 MG 24 hr tablet Take 150 mg by mouth daily.    [provider]  clindamycin (CLEOCIN) 150 MG capsule TAKE 1 CAPSULE BY MOUTH 3 TIMES DAILY Patient not taking: TAKE 1 CAPSULE BY MOUTH 3 TIMES DAILY 11/24/14   Hyatt, Max T, DPM  clopidogrel (PLAVIX) 75 MG tablet Take 75 mg by mouth daily.    [provider]  hyoscyamine (LEVSIN, ANASPAZ) 0.125 MG tablet Take 0.125 mg by mouth 3 (three) times daily as needed.    [provider]  Insulin Syringe-Needle U-100 (INSULIN SYRINGE .3CC/31GX5/16") 31G X 5/16" 0.3 ML MISC by Does not apply route.    [provider]  lansoprazole (PREVACID) 30 MG capsule Take 30 mg by mouth daily at 12 noon.    [provider]  Liraglutide (VICTOZA [place]) Inject 1.8 mg into the skin daily.     [provider]  sertraline (ZOLOFT) 50 MG tablet Take 50 mg by mouth daily.    [provider]    Allergies Captopril; Morphine and related; and Penicillins  Family History  Problem Relation Age of Onset  . Heart disease Father   . ALS Father   . Hypertension Mother   . Breast cancer Mother   . Heart attack Paternal Uncle   . Diabetes Son   . Heart disease Son     Social History Social History   Tobacco Use  . Smoking status: Never Smoker  . Smokeless tobacco: Never Used  Substance Use Topics  . Alcohol use: No  . Drug use: No    Review of Systems Constitutional: Positive for chills Eyes: No visual changes. ENT: No sore throat. Cardiovascular: Denies chest pain. Respiratory: Denies shortness of breath. Gastrointestinal: Positive for abdominal pain.  Positive for nausea, positive for vomiting.  No diarrhea.  No constipation. Genitourinary: Negative for dysuria. Musculoskeletal: Negative for back pain. Skin: Negative for  rash. Neurological: Negative for headaches, focal weakness or numbness.   ____________________________________________   PHYSICAL EXAM:  VITAL SIGNS: ED Triage Vitals  Enc Vitals Group     BP      Pulse      Resp      Temp      Temp src      SpO2      Weight      Height      Head Circumference      Peak Flow      Pain Score      Pain Loc      Pain Edu?      Excl. in GC?     Constitutional: Alert and oriented x4 appears quite uncomfortable  with shaking chills and Reiger's.  Skin very warm to the touch Eyes: PERRL EOMI. Head: Atraumatic. Nose: No congestion/rhinnorhea. Mouth/Throat: No trismus Neck: No stridor.   Cardiovascular: Tachycardic rate, regular rhythm. Grossly normal heart sounds.  Good peripheral circulation. Respiratory: Increased respiratory effort.  No retractions. Lungs CTAB and moving good air Gastrointestinal: Soft mild diffuse tenderness no rebound or guarding no peritonitis Musculoskeletal: No lower extremity edema   Neurologic:  Normal speech and language. No gross focal neurologic deficits are appreciated. Skin:  Skin is warm, dry and intact. No rash noted. Psychiatric: Mood and affect are normal. Speech and behavior are normal.    ____________________________________________   DIFFERENTIAL includes but not limited to  Sepsis, bacteremia, urinary tract infection, influenza, pneumonia, pulmonary embolism ____________________________________________   LABS (all labs ordered are listed, but only abnormal results are displayed)  Labs Reviewed  COMPREHENSIVE METABOLIC PANEL - Abnormal; Notable for the following components:      Result Value   CO2 20 (*)    Glucose, Bld 233 (*)    Creatinine, Ser 1.44 (*)    GFR calc non Af Amer 46 (*)    GFR calc Af Amer 53 (*)    All other components within normal limits  CBC WITH DIFFERENTIAL/PLATELET - Abnormal; Notable for the following components:   WBC 10.9 (*)    RBC 3.27 (*)    Hemoglobin 11.2  (*)    HCT 33.4 (*)    MCV 102.1 (*)    MCH 34.3 (*)    Neutro Abs 8.8 (*)    All other components within normal limits  URINALYSIS, COMPLETE (UACMP) WITH MICROSCOPIC - Abnormal; Notable for the following components:   Color, Urine YELLOW (*)    APPearance CLEAR (*)    Specific Gravity, Urine 1.042 (*)    Glucose, UA 150 (*)    All other components within normal limits  LACTIC ACID, PLASMA - Abnormal; Notable for the following components:   Lactic Acid, Venous 2.4 (*)    All other components within normal limits  BRAIN NATRIURETIC PEPTIDE - Abnormal; Notable for the following components:   B Natriuretic Peptide 219.0 (*)    All other components within normal limits  PROTIME-INR - Abnormal; Notable for the following components:   Prothrombin Time 20.1 (*)    All other components within normal limits  MAGNESIUM - Abnormal; Notable for the following components:   Magnesium 1.6 (*)    All other components within normal limits  PHOSPHORUS - Abnormal; Notable for the following components:   Phosphorus 1.9 (*)    All other components within normal limits  CULTURE, BLOOD (ROUTINE X 2)  CULTURE, BLOOD (ROUTINE X 2)  INFLUENZA PANEL BY PCR (TYPE A & B)  TROPONIN I  LACTIC ACID, PLASMA  LIPASE, BLOOD  PROCALCITONIN  FOLATE  URINALYSIS, ROUTINE W REFLEX MICROSCOPIC  VITAMIN B12    Lab work reviewed by me shows elevated lactic acid although it is clearing.  This is concerning for under resuscitation and infection.  Elevated beta natruretic peptide concerning for pulmonary edema.  No evidence of influenza.  Urinalysis is negative. __________________________________________  EKG  ED ECG REPORT I, Merrily Brittle, the attending physician, personally viewed and interpreted this ECG.  Date: 12/03/2018 EKG Time:  Rate: 91 Rhythm: normal sinus rhythm QRS Axis: normal Intervals: normal ST/T Wave abnormalities: normal Narrative Interpretation: no evidence of acute  ischemia  ____________________________________________  RADIOLOGY  Chest x-ray reviewed by me with no acute disease CT of the  chest reviewed by me with no evidence of pulmonary embolism CT abdomen pelvis reviewed by me concerning for possible left-sided pyelonephritis although nonspecific ____________________________________________   PROCEDURES  Procedure(s) performed: no  .Critical Care Performed by: Merrily Brittle, MD Authorized by: Merrily Brittle, MD   Critical care provider statement:    Critical care time (minutes):  40   Critical care time was exclusive of:  Separately billable procedures and treating other patients   Critical care was necessary to treat or prevent imminent or life-threatening deterioration of the following conditions:  Sepsis   Critical care was time spent personally by me on the following activities:  Development of treatment plan with patient or surrogate, discussions with consultants, evaluation of patient's response to treatment, examination of patient, obtaining history from patient or surrogate, ordering and performing treatments and interventions, ordering and review of laboratory studies, ordering and review of radiographic studies, pulse oximetry, re-evaluation of patient's condition and review of old charts    Critical Care performed: Yes  ____________________________________________   INITIAL IMPRESSION / ASSESSMENT AND PLAN / ED COURSE  Pertinent labs & imaging results that were available during my care of the patient were reviewed by me and considered in my medical decision making (see chart for details).   As part of my medical decision making, I reviewed the following data within the electronic MEDICAL RECORD NUMBER History obtained from family if available, nursing notes, old chart and ekg, as well as notes from prior ED visits.  The patient comes to the emergency department with an oral temperature of 98 degrees although feels extremely  warm to the touch so I checked a rectal temperature and he is 104 degrees raising concern for sepsis.  Unclear source as the patient has Reiger's and vague nausea and vomiting and mild abdominal discomfort.  Given the unclear source will check for influenza along with blood cultures and CT his abdomen pelvis.  CT his chest given his unexplained hypoxia.  He does come up with nasal cannula.  Broad-spectrum antibiotics for now.  The patient does have a known history of CHF although his last ejection fraction was about 55% so we will begin with 2 L of fluid and titrate his fluids to his lactic acid level and his response.  Fortunately the patient CTs are negative for acute pathology.  Unclear etiology of his sepsis so he will require inpatient admission for his blood cultures to come back into the see his response to fluids and antibiotics.  After IV Tylenol and 2 L of fluids as well as 2 mg of IV Ativan the patient feels significantly improved and is now able to get some rest.  Care signed over to the hospitalist Dr. Helayne Seminole who has graciously agreed to admit the patient to his service.      ____________________________________________   FINAL CLINICAL IMPRESSION(S) / ED DIAGNOSES  Final diagnoses:  Lactic acidosis  Sepsis, due to unspecified organism, unspecified whether acute organ dysfunction present [facility])      NEW MEDICATIONS STARTED DURING THIS VISIT:  New Prescriptions   No medications on file     Note:  This document was prepared using Dragon voice recognition software and may include unintentional dictation errors.    Merrily Brittle, MD 12/03/18 [phone]

## 2018-12-03 ENCOUNTER — Emergency Department: Payer: Medicare Other

## 2018-12-03 ENCOUNTER — Other Ambulatory Visit: Payer: Self-pay

## 2018-12-03 ENCOUNTER — Encounter: Payer: Self-pay | Admitting: Emergency Medicine

## 2018-12-03 DIAGNOSIS — B951 Streptococcus, group B, as the cause of diseases classified elsewhere: Secondary | ICD-10-CM | POA: Diagnosis not present

## 2018-12-03 DIAGNOSIS — Z8249 Family history of ischemic heart disease and other diseases of the circulatory system: Secondary | ICD-10-CM | POA: Diagnosis not present

## 2018-12-03 DIAGNOSIS — I2510 Atherosclerotic heart disease of native coronary artery without angina pectoris: Secondary | ICD-10-CM | POA: Diagnosis present

## 2018-12-03 DIAGNOSIS — M79671 Pain in right foot: Secondary | ICD-10-CM | POA: Diagnosis not present

## 2018-12-03 DIAGNOSIS — E1165 Type 2 diabetes mellitus with hyperglycemia: Secondary | ICD-10-CM | POA: Diagnosis present

## 2018-12-03 DIAGNOSIS — A401 Sepsis due to streptococcus, group B: Secondary | ICD-10-CM | POA: Diagnosis present

## 2018-12-03 DIAGNOSIS — Z9049 Acquired absence of other specified parts of digestive tract: Secondary | ICD-10-CM | POA: Diagnosis not present

## 2018-12-03 DIAGNOSIS — A419 Sepsis, unspecified organism: Secondary | ICD-10-CM | POA: Diagnosis present

## 2018-12-03 DIAGNOSIS — Z8674 Personal history of sudden cardiac arrest: Secondary | ICD-10-CM | POA: Diagnosis not present

## 2018-12-03 DIAGNOSIS — I5032 Chronic diastolic (congestive) heart failure: Secondary | ICD-10-CM | POA: Diagnosis present

## 2018-12-03 DIAGNOSIS — I130 Hypertensive heart and chronic kidney disease with heart failure and stage 1 through stage 4 chronic kidney disease, or unspecified chronic kidney disease: Secondary | ICD-10-CM | POA: Diagnosis present

## 2018-12-03 DIAGNOSIS — D631 Anemia in chronic kidney disease: Secondary | ICD-10-CM | POA: Diagnosis present

## 2018-12-03 DIAGNOSIS — L84 Corns and callosities: Secondary | ICD-10-CM | POA: Diagnosis not present

## 2018-12-03 DIAGNOSIS — M25512 Pain in left shoulder: Secondary | ICD-10-CM | POA: Diagnosis not present

## 2018-12-03 DIAGNOSIS — R791 Abnormal coagulation profile: Secondary | ICD-10-CM | POA: Diagnosis present

## 2018-12-03 DIAGNOSIS — E1151 Type 2 diabetes mellitus with diabetic peripheral angiopathy without gangrene: Secondary | ICD-10-CM | POA: Diagnosis present

## 2018-12-03 DIAGNOSIS — Z955 Presence of coronary angioplasty implant and graft: Secondary | ICD-10-CM | POA: Diagnosis not present

## 2018-12-03 DIAGNOSIS — Z885 Allergy status to narcotic agent status: Secondary | ICD-10-CM | POA: Diagnosis not present

## 2018-12-03 DIAGNOSIS — E1122 Type 2 diabetes mellitus with diabetic chronic kidney disease: Secondary | ICD-10-CM | POA: Diagnosis present

## 2018-12-03 DIAGNOSIS — Z833 Family history of diabetes mellitus: Secondary | ICD-10-CM | POA: Diagnosis not present

## 2018-12-03 DIAGNOSIS — I4891 Unspecified atrial fibrillation: Secondary | ICD-10-CM | POA: Diagnosis present

## 2018-12-03 DIAGNOSIS — N183 Chronic kidney disease, stage 3 (moderate): Secondary | ICD-10-CM | POA: Diagnosis present

## 2018-12-03 DIAGNOSIS — R509 Fever, unspecified: Secondary | ICD-10-CM | POA: Diagnosis present

## 2018-12-03 DIAGNOSIS — R351 Nocturia: Secondary | ICD-10-CM | POA: Diagnosis present

## 2018-12-03 DIAGNOSIS — Z803 Family history of malignant neoplasm of breast: Secondary | ICD-10-CM | POA: Diagnosis not present

## 2018-12-03 DIAGNOSIS — E872 Acidosis: Secondary | ICD-10-CM | POA: Diagnosis present

## 2018-12-03 DIAGNOSIS — Z952 Presence of prosthetic heart valve: Secondary | ICD-10-CM | POA: Diagnosis not present

## 2018-12-03 DIAGNOSIS — E119 Type 2 diabetes mellitus without complications: Secondary | ICD-10-CM | POA: Diagnosis not present

## 2018-12-03 DIAGNOSIS — Z951 Presence of aortocoronary bypass graft: Secondary | ICD-10-CM | POA: Diagnosis not present

## 2018-12-03 DIAGNOSIS — R7881 Bacteremia: Secondary | ICD-10-CM | POA: Diagnosis not present

## 2018-12-03 DIAGNOSIS — E785 Hyperlipidemia, unspecified: Secondary | ICD-10-CM | POA: Diagnosis present

## 2018-12-03 DIAGNOSIS — G8929 Other chronic pain: Secondary | ICD-10-CM | POA: Diagnosis present

## 2018-12-03 LAB — CBC WITH DIFFERENTIAL/PLATELET
ABS IMMATURE GRANULOCYTES: 0.06 10*3/uL (ref 0.00–0.07)
BASOS PCT: 0 %
Basophils Absolute: 0 10*3/uL (ref 0.0–0.1)
Eosinophils Absolute: 0.1 10*3/uL (ref 0.0–0.5)
Eosinophils Relative: 1 %
HEMATOCRIT: 33.4 % — AB (ref 39.0–52.0)
Hemoglobin: 11.2 g/dL — ABNORMAL LOW (ref 13.0–17.0)
IMMATURE GRANULOCYTES: 1 %
LYMPHS ABS: 1 10*3/uL (ref 0.7–4.0)
Lymphocytes Relative: 9 %
MCH: 34.3 pg — AB (ref 26.0–34.0)
MCHC: 33.5 g/dL (ref 30.0–36.0)
MCV: 102.1 fL — AB (ref 80.0–100.0)
MONO ABS: 0.9 10*3/uL (ref 0.1–1.0)
MONOS PCT: 9 %
NEUTROS PCT: 80 %
Neutro Abs: 8.8 10*3/uL — ABNORMAL HIGH (ref 1.7–7.7)
PLATELETS: 175 10*3/uL (ref 150–400)
RBC: 3.27 MIL/uL — ABNORMAL LOW (ref 4.22–5.81)
RDW: 14.6 % (ref 11.5–15.5)
WBC: 10.9 10*3/uL — ABNORMAL HIGH (ref 4.0–10.5)
nRBC: 0 % (ref 0.0–0.2)

## 2018-12-03 LAB — URINALYSIS, COMPLETE (UACMP) WITH MICROSCOPIC
BACTERIA UA: NONE SEEN
Bilirubin Urine: NEGATIVE
Glucose, UA: 150 mg/dL — AB
Hgb urine dipstick: NEGATIVE
Ketones, ur: NEGATIVE mg/dL
Leukocytes, UA: NEGATIVE
Nitrite: NEGATIVE
PROTEIN: NEGATIVE mg/dL
SQUAMOUS EPITHELIAL / LPF: NONE SEEN (ref 0–5)
Specific Gravity, Urine: 1.042 — ABNORMAL HIGH (ref 1.005–1.030)
pH: 5 (ref 5.0–8.0)

## 2018-12-03 LAB — BLOOD CULTURE ID PANEL (REFLEXED)
Acinetobacter baumannii: NOT DETECTED
Candida albicans: NOT DETECTED
Candida glabrata: NOT DETECTED
Candida krusei: NOT DETECTED
Candida parapsilosis: NOT DETECTED
Candida tropicalis: NOT DETECTED
ENTEROBACTER CLOACAE COMPLEX: NOT DETECTED
Enterobacteriaceae species: NOT DETECTED
Enterococcus species: NOT DETECTED
Escherichia coli: NOT DETECTED
Haemophilus influenzae: NOT DETECTED
Klebsiella oxytoca: NOT DETECTED
Klebsiella pneumoniae: NOT DETECTED
Listeria monocytogenes: NOT DETECTED
Neisseria meningitidis: NOT DETECTED
PSEUDOMONAS AERUGINOSA: NOT DETECTED
Proteus species: NOT DETECTED
STREPTOCOCCUS PNEUMONIAE: NOT DETECTED
Serratia marcescens: NOT DETECTED
Staphylococcus aureus (BCID): NOT DETECTED
Staphylococcus species: NOT DETECTED
Streptococcus agalactiae: DETECTED — AB
Streptococcus pyogenes: NOT DETECTED
Streptococcus species: DETECTED — AB

## 2018-12-03 LAB — COMPREHENSIVE METABOLIC PANEL
ALBUMIN: 4.4 g/dL (ref 3.5–5.0)
ALK PHOS: 63 U/L (ref 38–126)
ALT: 24 U/L (ref 0–44)
ANION GAP: 9 (ref 5–15)
AST: 28 U/L (ref 15–41)
BILIRUBIN TOTAL: 1 mg/dL (ref 0.3–1.2)
BUN: 19 mg/dL (ref 8–23)
CALCIUM: 8.9 mg/dL (ref 8.9–10.3)
CO2: 20 mmol/L — ABNORMAL LOW (ref 22–32)
Chloride: 108 mmol/L (ref 98–111)
Creatinine, Ser: 1.44 mg/dL — ABNORMAL HIGH (ref 0.61–1.24)
GFR calc Af Amer: 53 mL/min — ABNORMAL LOW (ref >60)
GFR, EST NON AFRICAN AMERICAN: 46 mL/min — AB (ref >60)
GLUCOSE: 233 mg/dL — AB (ref 70–99)
Potassium: 4.6 mmol/L (ref 3.5–5.1)
Sodium: 137 mmol/L (ref 135–145)
TOTAL PROTEIN: 7.7 g/dL (ref 6.5–8.1)

## 2018-12-03 LAB — PROCALCITONIN: Procalcitonin: <0.1 ng/mL

## 2018-12-03 LAB — TROPONIN I

## 2018-12-03 LAB — PROTIME-INR
INR: 1.74
Prothrombin Time: 20.1 seconds — ABNORMAL HIGH (ref 11.4–15.2)

## 2018-12-03 LAB — GLUCOSE, CAPILLARY
Glucose-Capillary: 149 mg/dL — ABNORMAL HIGH (ref 70–99)
Glucose-Capillary: 153 mg/dL — ABNORMAL HIGH (ref 70–99)
Glucose-Capillary: 108 mg/dL — ABNORMAL HIGH (ref 70–99)
Glucose-Capillary: 147 mg/dL — ABNORMAL HIGH (ref 70–99)

## 2018-12-03 LAB — LACTIC ACID, PLASMA
LACTIC ACID, VENOUS: 1.7 mmol/L (ref 0.5–1.9)
LACTIC ACID, VENOUS: 2.4 mmol/L — AB (ref 0.5–1.9)

## 2018-12-03 LAB — FOLATE: Folate: 15.6 ng/mL (ref >5.9)

## 2018-12-03 LAB — INFLUENZA PANEL BY PCR (TYPE A & B)
INFLAPCR: NEGATIVE
Influenza B By PCR: NEGATIVE

## 2018-12-03 LAB — MAGNESIUM: Magnesium: 1.6 mg/dL — ABNORMAL LOW (ref 1.7–2.4)

## 2018-12-03 LAB — VITAMIN B12: Vitamin B-12: 249 pg/mL (ref 180–914)

## 2018-12-03 LAB — PHOSPHORUS: Phosphorus: 1.9 mg/dL — ABNORMAL LOW (ref 2.5–4.6)

## 2018-12-03 LAB — LIPASE, BLOOD: LIPASE: 38 U/L (ref 11–51)

## 2018-12-03 LAB — BRAIN NATRIURETIC PEPTIDE: B Natriuretic Peptide: 219 pg/mL — ABNORMAL HIGH (ref 0.0–100.0)

## 2018-12-03 MED ORDER — INSULIN ASPART 100 UNIT/ML ~~LOC~~ SOLN
0.0000 [IU] | Freq: Three times a day (TID) | SUBCUTANEOUS | Status: DC
  Administered 2018-12-03: 2 [IU] via SUBCUTANEOUS
  Administered 2018-12-03: 3 [IU] via SUBCUTANEOUS
  Administered 2018-12-04 (×2): 5 [IU] via SUBCUTANEOUS
  Administered 2018-12-05 (×2): 2 [IU] via SUBCUTANEOUS
  Administered 2018-12-06: 3 [IU] via SUBCUTANEOUS
  Administered 2018-12-06: 5 [IU] via SUBCUTANEOUS
  Administered 2018-12-06: 3 [IU] via SUBCUTANEOUS
  Administered 2018-12-07: 2 [IU] via SUBCUTANEOUS
  Administered 2018-12-07: 8 [IU] via SUBCUTANEOUS
  Filled 2018-12-03 (×11): qty 1

## 2018-12-03 MED ORDER — SIMVASTATIN 20 MG PO TABS
40.0000 mg | ORAL_TABLET | Freq: Every day | ORAL | Status: DC
  Administered 2018-12-03 – 2018-12-07 (×5): 40 mg via ORAL
  Filled 2018-12-03 (×3): qty 2
  Filled 2018-12-03: qty 4
  Filled 2018-12-03: qty 2

## 2018-12-03 MED ORDER — IOHEXOL 350 MG/ML SOLN
100.0000 mL | Freq: Once | INTRAVENOUS | Status: AC | PRN
  Administered 2018-12-03: 100 mL via INTRAVENOUS

## 2018-12-03 MED ORDER — CEFAZOLIN SODIUM-DEXTROSE 2-4 GM/100ML-% IV SOLN
2.0000 g | Freq: Three times a day (TID) | INTRAVENOUS | Status: DC
  Administered 2018-12-03 – 2018-12-04 (×4): 2 g via INTRAVENOUS
  Filled 2018-12-03 (×7): qty 100

## 2018-12-03 MED ORDER — VANCOMYCIN HCL IN DEXTROSE 1-5 GM/200ML-% IV SOLN
1000.0000 mg | Freq: Once | INTRAVENOUS | Status: AC
  Administered 2018-12-03: 1000 mg via INTRAVENOUS
  Filled 2018-12-03: qty 200

## 2018-12-03 MED ORDER — WARFARIN SODIUM 7.5 MG PO TABS
7.5000 mg | ORAL_TABLET | Freq: Once | ORAL | Status: AC
  Administered 2018-12-03: 7.5 mg via ORAL
  Filled 2018-12-03: qty 1

## 2018-12-03 MED ORDER — SODIUM CHLORIDE 0.9 % IV SOLN
2.0000 g | Freq: Two times a day (BID) | INTRAVENOUS | Status: DC
  Administered 2018-12-03: 2 g via INTRAVENOUS
  Filled 2018-12-03 (×3): qty 2

## 2018-12-03 MED ORDER — VANCOMYCIN HCL IN DEXTROSE 750-5 MG/150ML-% IV SOLN
750.0000 mg | Freq: Two times a day (BID) | INTRAVENOUS | Status: DC
  Administered 2018-12-03 (×2): 750 mg via INTRAVENOUS
  Filled 2018-12-03 (×3): qty 150

## 2018-12-03 MED ORDER — ACETAMINOPHEN 325 MG PO TABS
650.0000 mg | ORAL_TABLET | Freq: Four times a day (QID) | ORAL | Status: DC | PRN
  Administered 2018-12-04 – 2018-12-06 (×3): 650 mg via ORAL
  Filled 2018-12-03 (×3): qty 2

## 2018-12-03 MED ORDER — ALPRAZOLAM 0.5 MG PO TABS
0.5000 mg | ORAL_TABLET | Freq: Every evening | ORAL | Status: DC | PRN
  Administered 2018-12-03: 0.5 mg via ORAL
  Filled 2018-12-03 (×2): qty 1

## 2018-12-03 MED ORDER — ESCITALOPRAM OXALATE 10 MG PO TABS
5.0000 mg | ORAL_TABLET | Freq: Every day | ORAL | Status: DC
  Administered 2018-12-03 – 2018-12-07 (×5): 5 mg via ORAL
  Filled 2018-12-03 (×4): qty 0.5
  Filled 2018-12-03: qty 1

## 2018-12-03 MED ORDER — VANCOMYCIN HCL IN DEXTROSE 1-5 GM/200ML-% IV SOLN: 1000.0000 mg | Freq: Two times a day (BID) | INTRAVENOUS | Status: DC

## 2018-12-03 MED ORDER — BISACODYL 5 MG PO TBEC
5.0000 mg | DELAYED_RELEASE_TABLET | Freq: Every day | ORAL | Status: DC | PRN
  Filled 2018-12-03: qty 1

## 2018-12-03 MED ORDER — BUPROPION HCL ER (XL) 150 MG PO TB24
150.0000 mg | ORAL_TABLET | Freq: Every day | ORAL | Status: DC
  Administered 2018-12-03 – 2018-12-07 (×5): 150 mg via ORAL
  Filled 2018-12-03 (×5): qty 1

## 2018-12-03 MED ORDER — K PHOS MONO-SOD PHOS DI & MONO 155-852-130 MG PO TABS
500.0000 mg | ORAL_TABLET | ORAL | Status: AC
  Administered 2018-12-03 – 2018-12-04 (×4): 500 mg via ORAL
  Filled 2018-12-03 (×4): qty 2

## 2018-12-03 MED ORDER — METRONIDAZOLE IN NACL 5-0.79 MG/ML-% IV SOLN
500.0000 mg | Freq: Three times a day (TID) | INTRAVENOUS | Status: DC
  Administered 2018-12-03 (×3): 500 mg via INTRAVENOUS
  Filled 2018-12-03 (×5): qty 100

## 2018-12-03 MED ORDER — MAGNESIUM SULFATE 2 GM/50ML IV SOLN
2.0000 g | Freq: Once | INTRAVENOUS | Status: AC
  Administered 2018-12-03: 2 g via INTRAVENOUS
  Filled 2018-12-03: qty 50

## 2018-12-03 MED ORDER — PANTOPRAZOLE SODIUM 40 MG PO TBEC
40.0000 mg | DELAYED_RELEASE_TABLET | Freq: Every day | ORAL | Status: DC
  Administered 2018-12-03 – 2018-12-07 (×5): 40 mg via ORAL
  Filled 2018-12-03 (×5): qty 1

## 2018-12-03 MED ORDER — GABAPENTIN 100 MG PO CAPS
100.0000 mg | ORAL_CAPSULE | Freq: Three times a day (TID) | ORAL | Status: DC
  Administered 2018-12-03 – 2018-12-07 (×13): 100 mg via ORAL
  Filled 2018-12-03 (×15): qty 1

## 2018-12-03 MED ORDER — SODIUM CHLORIDE 0.9 % IV BOLUS
1000.0000 mL | Freq: Once | INTRAVENOUS | Status: AC
  Administered 2018-12-03: 1000 mL via INTRAVENOUS

## 2018-12-03 MED ORDER — BRIMONIDINE TARTRATE 0.2 % OP SOLN
1.0000 [drp] | Freq: Two times a day (BID) | OPHTHALMIC | Status: DC
  Administered 2018-12-03 – 2018-12-06 (×8): 1 [drp] via OPHTHALMIC
  Filled 2018-12-03 (×3): qty 5

## 2018-12-03 MED ORDER — ACETAMINOPHEN 650 MG RE SUPP: 650.0000 mg | Freq: Four times a day (QID) | RECTAL | Status: DC | PRN

## 2018-12-03 MED ORDER — WARFARIN SODIUM 7.5 MG PO TABS
7.5000 mg | ORAL_TABLET | Freq: Every day | ORAL | Status: DC
  Filled 2018-12-03: qty 1

## 2018-12-03 MED ORDER — ONDANSETRON HCL 4 MG/2ML IJ SOLN
4.0000 mg | Freq: Once | INTRAMUSCULAR | Status: AC
  Administered 2018-12-03: 4 mg via INTRAVENOUS

## 2018-12-03 MED ORDER — CEFEPIME HCL 2 G IJ SOLR
2.0000 g | Freq: Once | INTRAMUSCULAR | Status: AC
  Administered 2018-12-03: 2 g via INTRAVENOUS
  Filled 2018-12-03: qty 2

## 2018-12-03 MED ORDER — SENNOSIDES-DOCUSATE SODIUM 8.6-50 MG PO TABS: 1.0000 | ORAL_TABLET | Freq: Every evening | ORAL | Status: DC | PRN

## 2018-12-03 MED ORDER — ACETAMINOPHEN 10 MG/ML IV SOLN
1000.0000 mg | Freq: Once | INTRAVENOUS | Status: AC
  Administered 2018-12-03: 1000 mg via INTRAVENOUS
  Filled 2018-12-03: qty 100

## 2018-12-03 MED ORDER — ONDANSETRON HCL 4 MG/2ML IJ SOLN: 4.0000 mg | Freq: Four times a day (QID) | INTRAMUSCULAR | Status: DC | PRN

## 2018-12-03 MED ORDER — ONDANSETRON HCL 4 MG PO TABS: 4.0000 mg | ORAL_TABLET | Freq: Four times a day (QID) | ORAL | Status: DC | PRN

## 2018-12-03 MED ORDER — INSULIN ASPART 100 UNIT/ML ~~LOC~~ SOLN
0.0000 [IU] | Freq: Every day | SUBCUTANEOUS | Status: DC
  Administered 2018-12-04: 2 [IU] via SUBCUTANEOUS
  Filled 2018-12-03: qty 1

## 2018-12-03 MED ORDER — LORAZEPAM 2 MG/ML IJ SOLN
2.0000 mg | Freq: Once | INTRAMUSCULAR | Status: AC
  Administered 2018-12-03: 2 mg via INTRAVENOUS

## 2018-12-03 MED ORDER — CLOPIDOGREL BISULFATE 75 MG PO TABS
75.0000 mg | ORAL_TABLET | Freq: Every day | ORAL | Status: DC
  Administered 2018-12-03 – 2018-12-07 (×5): 75 mg via ORAL
  Filled 2018-12-03 (×5): qty 1

## 2018-12-03 MED ORDER — INSULIN ASPART PROT & ASPART (70-30 MIX) 100 UNIT/ML ~~LOC~~ SUSP
25.0000 [IU] | Freq: Two times a day (BID) | SUBCUTANEOUS | Status: DC
  Administered 2018-12-03 – 2018-12-07 (×9): 25 [IU] via SUBCUTANEOUS
  Filled 2018-12-03 (×9): qty 10

## 2018-12-03 MED ORDER — LORAZEPAM 2 MG/ML IJ SOLN
INTRAMUSCULAR | Status: AC
  Administered 2018-12-03: 2 mg via INTRAVENOUS
  Filled 2018-12-03: qty 1

## 2018-12-03 MED ORDER — KETOROLAC TROMETHAMINE 30 MG/ML IJ SOLN
15.0000 mg | Freq: Once | INTRAMUSCULAR | Status: AC
  Administered 2018-12-03: 15 mg via INTRAVENOUS
  Filled 2018-12-03: qty 1

## 2018-12-03 MED ORDER — FERROUS GLUCONATE 324 (38 FE) MG PO TABS
324.0000 mg | ORAL_TABLET | Freq: Every day | ORAL | Status: DC
  Administered 2018-12-03 – 2018-12-07 (×4): 324 mg via ORAL
  Filled 2018-12-03 (×5): qty 1

## 2018-12-03 MED ORDER — SERTRALINE HCL 50 MG PO TABS
50.0000 mg | ORAL_TABLET | Freq: Every day | ORAL | Status: DC
  Administered 2018-12-03 – 2018-12-07 (×5): 50 mg via ORAL
  Filled 2018-12-03 (×5): qty 1

## 2018-12-03 MED ORDER — ASPIRIN EC 81 MG PO TBEC
81.0000 mg | DELAYED_RELEASE_TABLET | Freq: Every day | ORAL | Status: DC
  Administered 2018-12-03 – 2018-12-07 (×5): 81 mg via ORAL
  Filled 2018-12-03 (×5): qty 1

## 2018-12-03 MED ORDER — WARFARIN SODIUM 7.5 MG PO TABS
7.5000 mg | ORAL_TABLET | Freq: Every day | ORAL | Status: DC
  Administered 2018-12-03 – 2018-12-06 (×4): 7.5 mg via ORAL
  Filled 2018-12-03 (×6): qty 1

## 2018-12-03 MED ORDER — LATANOPROST 0.005 % OP SOLN
1.0000 [drp] | Freq: Two times a day (BID) | OPHTHALMIC | Status: DC
  Administered 2018-12-03 – 2018-12-06 (×4): 1 [drp] via OPHTHALMIC
  Filled 2018-12-03 (×3): qty 2.5

## 2018-12-03 MED ORDER — METOPROLOL SUCCINATE ER 25 MG PO TB24
25.0000 mg | ORAL_TABLET | Freq: Every day | ORAL | Status: DC
  Administered 2018-12-04 – 2018-12-05 (×2): 25 mg via ORAL
  Filled 2018-12-03 (×2): qty 1

## 2018-12-03 NOTE — ED Notes (Signed)
Spoke with dr. Marjie Skiff regarding 750mg  vancomycin dose and dose of 1000mg  of vancomycin administered at 0030. Dr. Marjie Skiff states to administer 750mg  at 0600 and proceed with q12 hour dose.

## 2018-12-03 NOTE — Consult Note (Signed)
PHARMACY CONSULT NOTE - FOLLOW UP  Pharmacy Consult for Electrolyte Monitoring and Replacement   Recent Labs: Potassium (mmol/L)  Date Value  12/02/2018 4.6   Magnesium (mg/dL)  Date Value  02/54/2706 1.6 (L)   Calcium (mg/dL)  Date Value  23/76/2831 8.9   Albumin (g/dL)  Date Value  51/76/1607 4.4   Phosphorus (mg/dL)  Date Value  37/08/6268 1.9 (L)   Sodium (mmol/L)  Date Value  12/02/2018 137     Assessment: 83 y.o. male with a known history of T2IDDM, HTN, HLD, CAD/MI, Hx CHF, Afib p/w rigors, fever, sepsis. Pt is AAOx3, but states he feels very weak and tired.  Goal of Therapy:  K ~4.0 Mg ~2.0 Phos 2.5-4.6  Plan:  Mg - 1.7, Will order Magnesium Sulf 2 gm IV once.  Phos - 1.9, Will order Phos Neutral 2 tablets every 4 hours for 4 doses.  K - 4.6, No replacement needed  Will recheck BMP, Mg, and Phos with AM labs.  Orinda Kenner, PharmD Clinical Pharmacist 12/03/2018 2:13 PM

## 2018-12-03 NOTE — ED Notes (Signed)
Pt states he takes his eyedrops at 0900.

## 2018-12-03 NOTE — ED Notes (Signed)
Lunch tray provided at this time; pt able to feed self.

## 2018-12-03 NOTE — ED Notes (Signed)
Pt sleeping. 

## 2018-12-03 NOTE — ED Notes (Signed)
Pt moved into a hospital bed, siderails up x3. Call bell at right side, pt instructed on how to use call bell. Water, emesis bag and urinal placed on bedside table to left of pt. Pt placed into a hospital gown. Pt states he feels improved after moving to more comfortable bed. Explanation provided to pt of need to wait for admission bed placement. Pt verbalizes understanding.

## 2018-12-03 NOTE — H&P (Addendum)
[facility] [place] [facility]   PATIENT NAME: Jeffrey Mills    MR#:  [id number]  DATE OF BIRTH:  January 07, 1936  DATE OF ADMISSION:  12/02/2018  PRIMARY CARE PHYSICIAN: Danella Penton, MD   REQUESTING/REFERRING PHYSICIAN: Merrily Brittle, MD  CHIEF COMPLAINT:   Chief Complaint  Patient presents with  . Fever    HISTORY OF PRESENT ILLNESS:  Jeffrey Mills  is a 83 y.o. male with a known history of T2IDDM, HTN, HLD, CAD/MI (s/p CABG x4 + MVR, 1999; s/p stents), Hx CHF (EF 55% as of 04/17/2017 Echo), Afib (Coumadin, goal INR 2.0-3.0 per note by Dr. Darrold Junker, 10/29/2018) p/w rigors, fever, sepsis. Pt is AAOx3, but states he feels very weak and tired ("run down" and "wiped out"). Hx obtained from pt's daughter. Pt went to church and then had lunch @~1300PM on Sunday 12/02/2018. He was in his usual state of health when his daughter left @~1330PM. @~1800-1830PM, pt called daughter and told her that he had been trembling/shaking since ~1630. He felt cold and subsequently developed nausea. His daughter got to his house @~2030PM. She felt he appeared pal and called EMS. Was afebrile to EMS, but felt poorly and requested transport to hospital. Vomiting x1 en route, cont'd vomiting after arriving to ED. High-grade fever (T 104F) in ED, tachycardic, tachypneic; SIRS (+). Denies cough, diarrhea, abdominal pain. Endorses diaphoresis. Endorses nocturia (urinating 4-5x/night) x3-4wks, but denies burning, pain, dysuria, frequency, urgency, hesitancy, dribbling, incontinence, pyuria, hematuria. He carries an ill/toxic appearance. CT C/A/P (+) "Heterogeneous enhancement of the left kidney mild perinephric edema, suspicious for acute pyelonephritis." U/A pending.  PAST MEDICAL HISTORY:   Past Medical History:  Diagnosis Date  . Anemia   . Anxiety   . Arthritis   . Benign hypertension   . Cardiopulmonary arrest (HCC)   . CHF (congestive heart failure) (HCC)   . Coronary  atherosclerosis of native coronary artery   . Depression   . Diabetes (HCC)   . Glaucoma   . Hyperlipidemia   . Mitral valve disorder   . Neuralgia   . PVD (peripheral vascular disease) (HCC)     PAST SURGICAL HISTORY:   Past Surgical History:  Procedure Laterality Date  . CHOLECYSTECTOMY    . CORONARY ANGIOPLASTY WITH STENT PLACEMENT    . CORONARY ARTERY BYPASS GRAFT    . MITRAL VALVE ANNULOPLASTY      SOCIAL HISTORY:   Social History   Tobacco Use  . Smoking status: Never Smoker  . Smokeless tobacco: Never Used  Substance Use Topics  . Alcohol use: No    FAMILY HISTORY:   Family History  Problem Relation Age of Onset  . Heart disease Father   . ALS Father   . Hypertension Mother   . Breast cancer Mother   . Heart attack Paternal Uncle   . Diabetes Son   . Heart disease Son     DRUG ALLERGIES:   Allergies  Allergen Reactions  . Captopril   . Morphine And Related   . Penicillins     REVIEW OF SYSTEMS:   Review of Systems  Constitutional: Positive for chills, diaphoresis, fever and malaise/fatigue. Negative for weight loss.  HENT: Negative for congestion, ear pain, hearing loss, nosebleeds, sinus pain, sore throat and tinnitus.   Eyes: Negative for blurred vision, double vision and photophobia.  Respiratory: Negative for cough, hemoptysis, sputum production, shortness of breath and wheezing.   Cardiovascular: Negative for chest pain, palpitations, orthopnea, claudication, leg  swelling and PND.  Gastrointestinal: Positive for nausea and vomiting. Negative for abdominal pain, blood in stool, constipation, diarrhea, heartburn and melena.  Genitourinary: Negative for dysuria, flank pain, frequency, hematuria and urgency.  Musculoskeletal: Negative for back pain, falls, joint pain, myalgias and neck pain.  Skin: Negative for itching and rash.  Neurological: Positive for weakness. Negative for dizziness, tingling, tremors, sensory change, speech change, focal  weakness, seizures, loss of consciousness and headaches.  Psychiatric/Behavioral: Negative for depression and memory loss. The patient is not nervous/anxious and does not have insomnia.    MEDICATIONS AT HOME:   Prior to Admission medications   Medication Sig Start Date End Date Taking? Authorizing Provider  acetaminophen (TYLENOL) 325 MG tablet Take 650 mg by mouth every 6 (six) hours as needed.   Yes [provider]  ALPRAZolam Prudy Feeler(XANAX) 0.5 MG tablet Take 0.5 mg by mouth at bedtime as needed for anxiety.   Yes [provider]  aspirin 81 MG tablet Take 81 mg by mouth daily.   Yes [provider]  brimonidine (ALPHAGAN) 0.2 % ophthalmic solution Place 1 drop into both eyes 2 (two) times daily.   Yes [provider]  escitalopram (LEXAPRO) 5 MG tablet Take 5 mg by mouth daily.   Yes [provider]  ferrous gluconate (FERGON) 324 MG tablet Take 324 mg by mouth daily with breakfast.   Yes [provider]  gabapentin (NEURONTIN) 100 MG capsule Take 100 mg by mouth 3 (three) times daily.   Yes [provider]  insulin lispro protamine-lispro (HUMALOG 75/25 MIX) (75-25) 100 UNIT/ML SUSP injection Inject 38 Units into the skin 2 (two) times daily with a meal.    Yes [provider]  insulin regular (NOVOLIN R) 100 units/mL injection Inject into the skin 3 (three) times daily before meals. Sliding Scale dose max of 10 units daily   Yes [provider]  latanoprost (XALATAN) 0.005 % ophthalmic solution Place 1 drop into both eyes 2 (two) times daily.  05/28/14  Yes [provider]  meclizine (ANTIVERT) 25 MG tablet Take 25 mg by mouth 3 (three) times daily as needed for dizziness.   Yes [provider]  metFORMIN (GLUCOPHAGE) 500 MG tablet Take 1,000 mg by mouth 2 (two) times daily with a meal.    Yes [provider]  metoprolol succinate (TOPROL-XL) 25 MG 24 hr tablet Take 25 mg by mouth daily.   Yes  [provider]  pantoprazole (PROTONIX) 40 MG tablet Take 40 mg by mouth daily.   Yes [provider]  simvastatin (ZOCOR) 40 MG tablet Take 40 mg by mouth daily.   Yes [provider]  triamcinolone cream (KENALOG) 0.1 % Apply 1 application topically 2 (two) times daily.   Yes [provider]  vitamin B-12 (CYANOCOBALAMIN) 500 MCG tablet Take 500 mcg by mouth daily.   Yes [provider]  warfarin (COUMADIN) 5 MG tablet Take 7.5 mg by mouth daily.   Yes [provider]  apixaban (ELIQUIS) 2.5 MG TABS tablet Take 2.5 mg by mouth 2 (two) times daily.    [provider]  buPROPion (WELLBUTRIN XL) 150 MG 24 hr tablet Take 150 mg by mouth daily.    [provider]  clindamycin (CLEOCIN) 150 MG capsule TAKE 1 CAPSULE BY MOUTH 3 TIMES DAILY Patient not taking: TAKE 1 CAPSULE BY MOUTH 3 TIMES DAILY 11/24/14   Hyatt, Max T, DPM  clopidogrel (PLAVIX) 75 MG tablet Take 75 mg  by mouth daily.    [provider]  hyoscyamine (LEVSIN, ANASPAZ) 0.125 MG tablet Take 0.125 mg by mouth 3 (three) times daily as needed.    [provider]  Insulin Syringe-Needle U-100 (INSULIN SYRINGE .3CC/31GX5/16") 31G X 5/16" 0.3 ML MISC by Does not apply route.    [provider]  lansoprazole (PREVACID) 30 MG capsule Take 30 mg by mouth daily at 12 noon.    [provider]  Liraglutide (VICTOZA [place]) Inject 1.8 mg into the skin daily.     [provider]  sertraline (ZOLOFT) 50 MG tablet Take 50 mg by mouth daily.    [provider]      VITAL SIGNS:  Blood pressure (!) 185/96, pulse 98, temperature (!) 104 F (40 C), temperature source Rectal, resp. rate 18, height 5\' 10"  (1.778 m), weight 106.1 kg, SpO2 94 %.  PHYSICAL EXAMINATION:  Physical Exam Constitutional:      General: He is awake. He is not in acute distress.    Appearance: Normal appearance. He is well-developed and overweight. He is  ill-appearing, toxic-appearing and diaphoretic.     Interventions: He is not intubated. HENT:     Head: Atraumatic.     Mouth/Throat:     Mouth: Mucous membranes are moist.  Eyes:     General: Lids are normal. No scleral icterus.    Extraocular Movements: Extraocular movements intact.     Conjunctiva/sclera: Conjunctivae normal.  Neck:     Musculoskeletal: Neck supple.  Cardiovascular:     Rate and Rhythm: Normal rate and regular rhythm.     Heart sounds: S1 normal and S2 normal. Heart sounds not distant. Murmur present. Systolic murmur present with a grade of 4/6. No diastolic murmur. No friction rub. No gallop. No S3 or S4 sounds.   Pulmonary:     Effort: Pulmonary effort is normal. No tachypnea, bradypnea, accessory muscle usage, prolonged expiration, respiratory distress or retractions. He is not intubated.     Breath sounds: Normal breath sounds and air entry. No stridor, decreased air movement or transmitted upper airway sounds. No decreased breath sounds, wheezing, rhonchi or rales.  Abdominal:     General: Bowel sounds are decreased. There is no distension.     Palpations: Abdomen is soft.     Tenderness: There is no abdominal tenderness. There is no guarding or rebound.  Musculoskeletal: Normal range of motion.        General: No swelling or tenderness.     Right lower leg: No edema.     Left lower leg: No edema.  Lymphadenopathy:     Cervical: No cervical adenopathy.  Skin:    General: Skin is warm.     Coloration: Skin is not pale.     Findings: No erythema or rash.  Neurological:     Mental Status: He is alert and oriented to person, place, and time. Mental status is at baseline.  Psychiatric:        Attention and Perception: Attention and perception normal.        Mood and Affect: Mood and affect normal.        Speech: Speech normal.        Behavior: Behavior normal. Behavior is cooperative.        Thought Content: Thought content normal.        Cognition and  Memory: Cognition and memory normal.        Judgment: Judgment normal.    Regular rate + rhythm.  LABORATORY PANEL:   CBC Recent Labs  Lab 12/02/18 2353  WBC 10.9*  HGB 11.2*  HCT 33.4*  PLT 175   ------------------------------------------------------------------------------------------------------------------  Chemistries  Recent Labs  Lab 12/02/18 2353  NA 137  K 4.6  CL 108  CO2 20*  GLUCOSE 233*  BUN 19  CREATININE 1.44*  CALCIUM 8.9  AST 28  ALT 24  ALKPHOS 63  BILITOT 1.0   ------------------------------------------------------------------------------------------------------------------  Cardiac Enzymes Recent Labs  Lab 12/02/18 2353  TROPONINI <0.03   ------------------------------------------------------------------------------------------------------------------  RADIOLOGY:  Ct Angio Chest Pe W/cm &/or Wo Cm  Result Date: 12/03/2018 CLINICAL DATA:  Nausea, vomiting, diarrhea, abdominal pain and fever. EXAM: CT ANGIOGRAPHY CHEST CT ABDOMEN AND PELVIS WITH CONTRAST TECHNIQUE: Multidetector CT imaging of the chest was performed using the standard protocol during bolus administration of intravenous contrast. Multiplanar CT image reconstructions and MIPs were obtained to evaluate the vascular anatomy. Multidetector CT imaging of the abdomen and pelvis was performed using the standard protocol during bolus administration of intravenous contrast. CONTRAST:  OMNIPAQUE IOHEXOL 350 MG/ML SOLN COMPARISON:  Chest radiograph earlier this day. Abdominal CT 09/01/2014 FINDINGS: CTA CHEST FINDINGS Cardiovascular: There are no filling defects within the pulmonary arteries to suggest pulmonary embolus. Tortuous atherosclerotic thoracic aorta without dissection. Post CABG with calcification of native coronary arteries. Borderline cardiomegaly. No pericardial effusion. Mediastinum/Nodes: No enlarged mediastinal or hilar lymph nodes. Esophagus mildly patulous. Small hiatal  hernia. No visualized thyroid nodule. Lungs/Pleura: Breathing motion artifact limits basilar assessment. Suggestion of mild septal thickening. Minor dependent atelectasis. No confluent airspace disease. No pleural fluid. Musculoskeletal: There are no acute or suspicious osseous abnormalities. Review of the MIP images confirms the above findings. CT ABDOMEN and PELVIS FINDINGS Hepatobiliary: Mild motion artifact limitations. Question of nodular hepatic contour versus motion artifact. No focal hepatic abnormality. Clips in the gallbladder fossa postcholecystectomy. No biliary dilatation. Pancreas: No ductal dilatation or inflammation. Spleen: Upper normal in size spanning 14 cm. Adrenals/Urinary Tract: Normal adrenal glands. No hydronephrosis. Suggestion of heterogeneous enhancement of the left kidney with loss of corticomedullary differentiation and mild perinephric edema, motion artifact partially obscures evaluation. There are bilateral simple cysts. Urinary bladder is physiologically distended with a small right posterior bladder diverticulum. No perivesicular edema. Stomach/Bowel: Small hiatal hernia. Stomach physiologically distended. No small bowel wall thickening, inflammatory change, or obstruction. Normal appendix. Small to moderate colonic stool burden. Mild sigmoid diverticulosis. Distal colonic tortuosity. No colonic wall thickening or inflammatory change. Vascular/Lymphatic: Moderate to advanced aorta bi-iliac atherosclerosis. No acute vascular finding. Portal vein and mesenteric branches are patent. No enlarged lymph nodes in the abdomen or pelvis, allowing for mild motion artifact limitations. Reproductive: Prostate is unremarkable. Vas deferens calcifications. Other: No free air, free fluid, or intra-abdominal fluid collection. Musculoskeletal: There are no acute or suspicious osseous abnormalities. Review of the MIP images confirms the above findings. IMPRESSION: CHEST CTA: 1. No pulmonary embolus.  2. Minimal septal thickening which may represent mild pulmonary edema. ABDOMEN/PELVIS: 1. Heterogeneous enhancement of the left kidney mild perinephric edema, suspicious for acute pyelonephritis. Recommend correlation with urinalysis. 2. Questionable nodular hepatic contours which raise possibility of cirrhosis. Recommend correlation with cirrhosis risk factors. 3. Colonic diverticulosis without diverticulitis. Aortic Atherosclerosis (ICD10-I70.0). Electronically Signed   By: Narda Rutherford M.D.   On: 12/03/2018 01:56   Ct Abdomen Pelvis W Contrast  Result Date: 12/03/2018 CLINICAL DATA:  Nausea, vomiting, diarrhea, abdominal pain and fever. EXAM: CT ANGIOGRAPHY CHEST CT ABDOMEN AND PELVIS WITH CONTRAST TECHNIQUE: Multidetector CT imaging of  the chest was performed using the standard protocol during bolus administration of intravenous contrast. Multiplanar CT image reconstructions and MIPs were obtained to evaluate the vascular anatomy. Multidetector CT imaging of the abdomen and pelvis was performed using the standard protocol during bolus administration of intravenous contrast. CONTRAST:  OMNIPAQUE IOHEXOL 350 MG/ML SOLN COMPARISON:  Chest radiograph earlier this day. Abdominal CT 09/01/2014 FINDINGS: CTA CHEST FINDINGS Cardiovascular: There are no filling defects within the pulmonary arteries to suggest pulmonary embolus. Tortuous atherosclerotic thoracic aorta without dissection. Post CABG with calcification of native coronary arteries. Borderline cardiomegaly. No pericardial effusion. Mediastinum/Nodes: No enlarged mediastinal or hilar lymph nodes. Esophagus mildly patulous. Small hiatal hernia. No visualized thyroid nodule. Lungs/Pleura: Breathing motion artifact limits basilar assessment. Suggestion of mild septal thickening. Minor dependent atelectasis. No confluent airspace disease. No pleural fluid. Musculoskeletal: There are no acute or suspicious osseous abnormalities. Review of the MIP images  confirms the above findings. CT ABDOMEN and PELVIS FINDINGS Hepatobiliary: Mild motion artifact limitations. Question of nodular hepatic contour versus motion artifact. No focal hepatic abnormality. Clips in the gallbladder fossa postcholecystectomy. No biliary dilatation. Pancreas: No ductal dilatation or inflammation. Spleen: Upper normal in size spanning 14 cm. Adrenals/Urinary Tract: Normal adrenal glands. No hydronephrosis. Suggestion of heterogeneous enhancement of the left kidney with loss of corticomedullary differentiation and mild perinephric edema, motion artifact partially obscures evaluation. There are bilateral simple cysts. Urinary bladder is physiologically distended with a small right posterior bladder diverticulum. No perivesicular edema. Stomach/Bowel: Small hiatal hernia. Stomach physiologically distended. No small bowel wall thickening, inflammatory change, or obstruction. Normal appendix. Small to moderate colonic stool burden. Mild sigmoid diverticulosis. Distal colonic tortuosity. No colonic wall thickening or inflammatory change. Vascular/Lymphatic: Moderate to advanced aorta bi-iliac atherosclerosis. No acute vascular finding. Portal vein and mesenteric branches are patent. No enlarged lymph nodes in the abdomen or pelvis, allowing for mild motion artifact limitations. Reproductive: Prostate is unremarkable. Vas deferens calcifications. Other: No free air, free fluid, or intra-abdominal fluid collection. Musculoskeletal: There are no acute or suspicious osseous abnormalities. Review of the MIP images confirms the above findings. IMPRESSION: CHEST CTA: 1. No pulmonary embolus. 2. Minimal septal thickening which may represent mild pulmonary edema. ABDOMEN/PELVIS: 1. Heterogeneous enhancement of the left kidney mild perinephric edema, suspicious for acute pyelonephritis. Recommend correlation with urinalysis. 2. Questionable nodular hepatic contours which raise possibility of cirrhosis.  Recommend correlation with cirrhosis risk factors. 3. Colonic diverticulosis without diverticulitis. Aortic Atherosclerosis (ICD10-I70.0). Electronically Signed   By: Narda Rutherford M.D.   On: 12/03/2018 01:56   Dg Chest Port 1 View  Result Date: 12/03/2018 CLINICAL DATA:  Chills and nausea starting at 1630 hours today. EXAM: PORTABLE CHEST 1 VIEW COMPARISON:  05/04/2011 FINDINGS: The patient is status post CABG with median sternotomy sutures in place. Mild aortic atherosclerosis is identified at the arch without aneurysm. Chronic interstitial prominence is noted of the lungs with medial left basilar atelectasis. No acute pulmonary consolidation, effusion or pneumothorax. Osteoarthritis of the glenohumeral joint. No acute nor suspicious osseous lesions. IMPRESSION: No active disease. Electronically Signed   By: Tollie Eth M.D.   On: 12/03/2018 00:31   IMPRESSION AND PLAN:   A/P: 45M w/ PMHx T2IDDM, HTN, HLD, CAD/MI (s/p CABG x4 + MVR, 1999; s/p stents), Hx CHF (EF 55% as of 04/17/2017 Echo), Afib (Coumadin, goal INR 2.0-3.0 per note by Dr. Darrold Junker, 10/29/2018) p/w rigors, N/V, fever, nocturia, sepsis, suspected L pyelonephritis. Hyperglycemia (w/ T2IDDM), Cr elevation/CKD III, lactate elevation, mild leukocytosis, macrocytic anemia.  Subtherapeutic INR. -Rigors, N/V, fever, nocturia, sepsis, suspected L pyelonephritis, lactate elevation, mild leukocytosis: As per HPI. High-grade fever (T 104F) in ED, tachycardic, tachypneic; WBC 1.09; SIRS (+). (+) nocturia (urinating 4-5x/night) x3-4wks; (-) burning, pain, dysuria, frequency, urgency, hesitancy, dribbling, incontinence, pyuria, hematuria. Appears ill/toxic. CT C/A/P (+) "Heterogeneous enhancement of the left kidney mild perinephric edema, suspicious for acute pyelonephritis." U/A pending. UCx, BCx pending. PCT < 0.10. Lactate 2.4, 1.7. Started on broad-spectrum ABx in ED (Vancomycin + Cefepime + Flagyl); can likely be de-escalated in short order. IVF,  symptomatic mgmt. -Hyperglycemia, T2IDDM: SSI. 75/25 BID, reduced dose. -Cr elevation, CKD III: Cr 1.44 on present admission, baseline 1.3-1.5. Baseline CKD III (2/2 DM, HTN, aged kidney). IVF. Monitor BMP, avoid nephrotoxins. -Macrocytic anemia: B12, folate pending. -Subtherapeutic INR: Coumadin, goal INR 2.0-3.0. INR 1.74 on present admission. C/w present dosing, may need adjustment. Receiving Flagyl (for now), expected to derange INR. -c/w home meds/formulary subs. -FEN/GI: Cardiac diabetic diet. -DVT PPx: Coumadin. -Code status: Full code. -Disposition: Admission, > 2 midnights.   All the records are reviewed and case discussed with ED provider. Management plans discussed with the patient, family and they are in agreement.  CODE STATUS: Full code.  TOTAL TIME TAKING CARE OF THIS PATIENT: 75 minutes.    Barbaraann RondoPrasanna Kharis Lapenna M.D on 12/03/2018 at 2:41 AM  Between 7am to 6pm - Pager - [phone]  After 6pm go to [email or web address] - Social research officer, governmentpassword [email or web address]  [facility]  Office  [phone]  CC: Primary care physician; Danella PentonMiller, Mark F, MD   Note: This dictation was prepared with Dragon dictation along with smaller phrase technology. Any transcriptional errors that result from this process are unintentional.

## 2018-12-03 NOTE — ED Notes (Signed)
CBG blood glucose 153, Ashley,RN made aware.

## 2018-12-03 NOTE — ED Notes (Signed)
Report from vanessa, rn 

## 2018-12-03 NOTE — Progress Notes (Signed)
Family Meeting Note  Advance Directive:yes  Today a meeting took place with the Patient.   The following clinical team members were present during this meeting:MD  The following were discussed:Patient's diagnosis: Sepsis, pyelonephritis, chronic kidney disease stage III, Patient's progosis: Unable to determine and Goals for treatment: Full Code  Additional follow-up to be provided: Primary care physician  Time spent during discussion:20 minutes  Altamese Dilling, MD

## 2018-12-03 NOTE — Progress Notes (Signed)
CODE SEPSIS - PHARMACY COMMUNICATION  **Broad Spectrum Antibiotics should be administered within 1 hour of Sepsis diagnosis**  Time Code Sepsis Called/Page Received: 0004  Antibiotics Ordered: vanc/cefepime/flagyl  Time of 1st antibiotic administration: 0021  Additional action taken by pharmacy:   If necessary, Name of Provider/Nurse Contacted:     Thomasene Ripple ,PharmD Clinical Pharmacist  12/03/2018  4:12 AM

## 2018-12-03 NOTE — Progress Notes (Signed)
Pharmacy Antibiotic Note  Jeffrey Mills is a 83 y.o. male admitted on 12/02/2018 with pneumonia.  Pharmacy has been consulted for vanc/cefepime dosing. Patient received vanc 1g, cefepime 2g, and flagyl 500 mg IV x 1 in ED  Plan: Will continue vanc 750 mg IV q12h w/ 6 hour stack  Will draw trough 01/07 @ 1700 prior to 4th dose. Will continue cefepime 2g IV q12h  Ke 0.0458 T1/2 ~ 12 hrs Goal trough 15 - 20 mcg/mL  Height: 5\' 10"  (177.8 cm) Weight: 234 lb (106.1 kg) IBW/kg (Calculated) : 73  Temp (24hrs), Avg:104 F (40 C), Min:104 F (40 C), Max:104 F (40 C)  Recent Labs  Lab 12/02/18 2353 12/03/18 0157  WBC 10.9*  --   CREATININE 1.44*  --   LATICACIDVEN 2.4* 1.7    Estimated Creatinine Clearance: 49.9 mL/min (A) (by C-G formula based on SCr of 1.44 mg/dL (H)).    Allergies  Allergen Reactions  . Captopril   . Morphine And Related   . Penicillins     Thank you for allowing pharmacy to be a part of this patient's care.  Thomasene Ripple, PharmD, BCPS Clinical Pharmacist 12/03/2018

## 2018-12-03 NOTE — Progress Notes (Signed)
ANTICOAGULATION CONSULT NOTE - Initial Consult  Pharmacy Consult for warfarin Indication: atrial fibrillation  Allergies  Allergen Reactions  . Captopril   . Morphine And Related   . Penicillins     Patient Measurements: Height: 5\' 10"  (177.8 cm) Weight: 234 lb (106.1 kg) IBW/kg (Calculated) : 73 Heparin Dosing Weight: 96 kg  Vital Signs: Temp: 104 F (40 C) (01/06 0007) Temp Source: Rectal (01/06 0007) BP: 185/96 (01/06 0030) Pulse Rate: 98 (01/06 0130)  Labs: Recent Labs    12/02/18 2353 12/03/18 0040  HGB 11.2*  --   HCT 33.4*  --   PLT 175  --   LABPROT  --  20.1*  INR  --  1.74  CREATININE 1.44*  --   TROPONINI <0.03  --     Estimated Creatinine Clearance: 49.9 mL/min (A) (by C-G formula based on SCr of 1.44 mg/dL (H)).   Medical History: Past Medical History:  Diagnosis Date  . Anemia   . Anxiety   . Arthritis   . Benign hypertension   . Cardiopulmonary arrest (HCC)   . CHF (congestive heart failure) (HCC)   . Coronary atherosclerosis of native coronary artery   . Depression   . Diabetes (HCC)   . Glaucoma   . Hyperlipidemia   . Mitral valve disorder   . Neuralgia   . PVD (peripheral vascular disease) (HCC)     Medications:  Scheduled:  . warfarin  7.5 mg Oral Once  . warfarin  7.5 mg Oral q1800    Assessment: Patient arrived for chills and is flu +. Patient also w/ h/o afib is anticoagulated w/ warfarin PTA. Warfarin home dose: 7.5 mg daily  01/06 INR 1.74  Goal of Therapy:  INR 2-3 Monitor platelets by anticoagulation protocol: Yes   Plan:  INR currently subtherapeutic Will give patient warfarin 7.5 mg PO x 1 Will restart patient's regimen of warfarin 7.5 mg daily @ 1800 Will monitor daily CBC's and adjust per daily INRs  Thomasene Ripple, PharmD, BCPS Clinical Pharmacist 12/03/2018

## 2018-12-03 NOTE — Progress Notes (Signed)
[employer] [facility] [facility] [place] [facility]   PATIENT NAME: Jeffrey MassedLarry Mills    MR#:  [id number]  DATE OF BIRTH:  09/04/1936  SUBJECTIVE:  CHIEF COMPLAINT:   Chief Complaint  Patient presents with  . Fever   Came with fever and generalized weakness with shaking and chills. No clear source of infection yet, had some vomiting episodes at home. No diarrhea or abdominal pain. REVIEW OF SYSTEMS:  CONSTITUTIONAL: Have fever, fatigue or weakness.  EYES: No blurred or double vision.  EARS, NOSE, AND THROAT: No tinnitus or ear pain.  RESPIRATORY: No cough, shortness of breath, wheezing or hemoptysis.  CARDIOVASCULAR: No chest pain, orthopnea, edema.  GASTROINTESTINAL: Have nausea, vomiting, no diarrhea or abdominal pain.  GENITOURINARY: No dysuria, hematuria.  ENDOCRINE: No polyuria, nocturia,  HEMATOLOGY: No anemia, easy bruising or bleeding SKIN: No rash or lesion. MUSCULOSKELETAL: No joint pain or arthritis.   NEUROLOGIC: No tingling, numbness, weakness.  PSYCHIATRY: No anxiety or depression.   ROS  DRUG ALLERGIES:   Allergies  Allergen Reactions  . Captopril   . Morphine And Related   . Penicillins     VITALS:  Blood pressure 130/64, pulse 63, temperature 98.4 F (36.9 C), temperature source Oral, resp. rate 19, height 5\' 10"  (1.778 m), weight 106.1 kg, SpO2 98 %.  PHYSICAL EXAMINATION:  GENERAL:  83 y.o.-year-old patient lying in the bed with no acute distress.  EYES: Pupils equal, round, reactive to light and accommodation. No scleral icterus. Extraocular muscles intact.  HEENT: Head atraumatic, normocephalic. Oropharynx and nasopharynx clear.  NECK:  Supple, no jugular venous distention. No thyroid enlargement, no tenderness.  LUNGS: Normal breath sounds bilaterally, no wheezing, rales,rhonchi or crepitation. No use of accessory muscles of respiration.  CARDIOVASCULAR: S1, S2 normal. No murmurs, rubs, or gallops.  ABDOMEN: Soft, nontender, nondistended. Bowel  sounds present. No organomegaly or mass.  EXTREMITIES: No pedal edema, cyanosis, or clubbing.  NEUROLOGIC: Cranial nerves II through XII are intact. Muscle strength 4/5 in all extremities. Sensation intact. Gait not checked.  PSYCHIATRIC: The patient is alert and oriented x 3.  SKIN: No obvious rash, lesion, or ulcer.   Physical Exam LABORATORY PANEL:   CBC Recent Labs  Lab 12/02/18 2353  WBC 10.9*  HGB 11.2*  HCT 33.4*  PLT 175   ------------------------------------------------------------------------------------------------------------------  Chemistries  Recent Labs  Lab 12/02/18 2353  NA 137  K 4.6  CL 108  CO2 20*  GLUCOSE 233*  BUN 19  CREATININE 1.44*  CALCIUM 8.9  MG 1.6*  AST 28  ALT 24  ALKPHOS 63  BILITOT 1.0   ------------------------------------------------------------------------------------------------------------------  Cardiac Enzymes Recent Labs  Lab 12/02/18 2353  TROPONINI <0.03   ------------------------------------------------------------------------------------------------------------------  RADIOLOGY:  Ct Angio Chest Pe W/cm &/or Wo Cm  Result Date: 12/03/2018 CLINICAL DATA:  Nausea, vomiting, diarrhea, abdominal pain and fever. EXAM: CT ANGIOGRAPHY CHEST CT ABDOMEN AND PELVIS WITH CONTRAST TECHNIQUE: Multidetector CT imaging of the chest was performed using the standard protocol during bolus administration of intravenous contrast. Multiplanar CT image reconstructions and MIPs were obtained to evaluate the vascular anatomy. Multidetector CT imaging of the abdomen and pelvis was performed using the standard protocol during bolus administration of intravenous contrast. CONTRAST:  100mL OMNIPAQUE IOHEXOL 350 MG/ML SOLN COMPARISON:  Chest radiograph earlier this day. Abdominal CT 09/01/2014 FINDINGS: CTA CHEST FINDINGS Cardiovascular: There are no filling defects within the pulmonary arteries to suggest pulmonary embolus. Tortuous atherosclerotic  thoracic aorta without dissection. Post CABG with calcification of native coronary  arteries. Borderline cardiomegaly. No pericardial effusion. Mediastinum/Nodes: No enlarged mediastinal or hilar lymph nodes. Esophagus mildly patulous. Small hiatal hernia. No visualized thyroid nodule. Lungs/Pleura: Breathing motion artifact limits basilar assessment. Suggestion of mild septal thickening. Minor dependent atelectasis. No confluent airspace disease. No pleural fluid. Musculoskeletal: There are no acute or suspicious osseous abnormalities. Review of the MIP images confirms the above findings. CT ABDOMEN and PELVIS FINDINGS Hepatobiliary: Mild motion artifact limitations. Question of nodular hepatic contour versus motion artifact. No focal hepatic abnormality. Clips in the gallbladder fossa postcholecystectomy. No biliary dilatation. Pancreas: No ductal dilatation or inflammation. Spleen: Upper normal in size spanning 14 cm. Adrenals/Urinary Tract: Normal adrenal glands. No hydronephrosis. Suggestion of heterogeneous enhancement of the left kidney with loss of corticomedullary differentiation and mild perinephric edema, motion artifact partially obscures evaluation. There are bilateral simple cysts. Urinary bladder is physiologically distended with a small right posterior bladder diverticulum. No perivesicular edema. Stomach/Bowel: Small hiatal hernia. Stomach physiologically distended. No small bowel wall thickening, inflammatory change, or obstruction. Normal appendix. Small to moderate colonic stool burden. Mild sigmoid diverticulosis. Distal colonic tortuosity. No colonic wall thickening or inflammatory change. Vascular/Lymphatic: Moderate to advanced aorta bi-iliac atherosclerosis. No acute vascular finding. Portal vein and mesenteric branches are patent. No enlarged lymph nodes in the abdomen or pelvis, allowing for mild motion artifact limitations. Reproductive: Prostate is unremarkable. Vas deferens  calcifications. Other: No free air, free fluid, or intra-abdominal fluid collection. Musculoskeletal: There are no acute or suspicious osseous abnormalities. Review of the MIP images confirms the above findings. IMPRESSION: CHEST CTA: 1. No pulmonary embolus. 2. Minimal septal thickening which may represent mild pulmonary edema. ABDOMEN/PELVIS: 1. Heterogeneous enhancement of the left kidney mild perinephric edema, suspicious for acute pyelonephritis. Recommend correlation with urinalysis. 2. Questionable nodular hepatic contours which raise possibility of cirrhosis. Recommend correlation with cirrhosis risk factors. 3. Colonic diverticulosis without diverticulitis. Aortic Atherosclerosis (ICD10-I70.0). Electronically Signed   By: Narda RutherfordMelanie  Sanford M.D.   On: 12/03/2018 01:56   Ct Abdomen Pelvis W Contrast  Result Date: 12/03/2018 CLINICAL DATA:  Nausea, vomiting, diarrhea, abdominal pain and fever. EXAM: CT ANGIOGRAPHY CHEST CT ABDOMEN AND PELVIS WITH CONTRAST TECHNIQUE: Multidetector CT imaging of the chest was performed using the standard protocol during bolus administration of intravenous contrast. Multiplanar CT image reconstructions and MIPs were obtained to evaluate the vascular anatomy. Multidetector CT imaging of the abdomen and pelvis was performed using the standard protocol during bolus administration of intravenous contrast. CONTRAST:  100mL OMNIPAQUE IOHEXOL 350 MG/ML SOLN COMPARISON:  Chest radiograph earlier this day. Abdominal CT 09/01/2014 FINDINGS: CTA CHEST FINDINGS Cardiovascular: There are no filling defects within the pulmonary arteries to suggest pulmonary embolus. Tortuous atherosclerotic thoracic aorta without dissection. Post CABG with calcification of native coronary arteries. Borderline cardiomegaly. No pericardial effusion. Mediastinum/Nodes: No enlarged mediastinal or hilar lymph nodes. Esophagus mildly patulous. Small hiatal hernia. No visualized thyroid nodule. Lungs/Pleura:  Breathing motion artifact limits basilar assessment. Suggestion of mild septal thickening. Minor dependent atelectasis. No confluent airspace disease. No pleural fluid. Musculoskeletal: There are no acute or suspicious osseous abnormalities. Review of the MIP images confirms the above findings. CT ABDOMEN and PELVIS FINDINGS Hepatobiliary: Mild motion artifact limitations. Question of nodular hepatic contour versus motion artifact. No focal hepatic abnormality. Clips in the gallbladder fossa postcholecystectomy. No biliary dilatation. Pancreas: No ductal dilatation or inflammation. Spleen: Upper normal in size spanning 14 cm. Adrenals/Urinary Tract: Normal adrenal glands. No hydronephrosis. Suggestion of heterogeneous enhancement of the left kidney with loss of corticomedullary differentiation  and mild perinephric edema, motion artifact partially obscures evaluation. There are bilateral simple cysts. Urinary bladder is physiologically distended with a small right posterior bladder diverticulum. No perivesicular edema. Stomach/Bowel: Small hiatal hernia. Stomach physiologically distended. No small bowel wall thickening, inflammatory change, or obstruction. Normal appendix. Small to moderate colonic stool burden. Mild sigmoid diverticulosis. Distal colonic tortuosity. No colonic wall thickening or inflammatory change. Vascular/Lymphatic: Moderate to advanced aorta bi-iliac atherosclerosis. No acute vascular finding. Portal vein and mesenteric branches are patent. No enlarged lymph nodes in the abdomen or pelvis, allowing for mild motion artifact limitations. Reproductive: Prostate is unremarkable. Vas deferens calcifications. Other: No free air, free fluid, or intra-abdominal fluid collection. Musculoskeletal: There are no acute or suspicious osseous abnormalities. Review of the MIP images confirms the above findings. IMPRESSION: CHEST CTA: 1. No pulmonary embolus. 2. Minimal septal thickening which may represent mild  pulmonary edema. ABDOMEN/PELVIS: 1. Heterogeneous enhancement of the left kidney mild perinephric edema, suspicious for acute pyelonephritis. Recommend correlation with urinalysis. 2. Questionable nodular hepatic contours which raise possibility of cirrhosis. Recommend correlation with cirrhosis risk factors. 3. Colonic diverticulosis without diverticulitis. Aortic Atherosclerosis (ICD10-I70.0). Electronically Signed   By: Narda Rutherford M.D.   On: 12/03/2018 01:56   Dg Chest Port 1 View  Result Date: 12/03/2018 CLINICAL DATA:  Chills and nausea starting at 1630 hours today. EXAM: PORTABLE CHEST 1 VIEW COMPARISON:  05/04/2011 FINDINGS: The patient is status post CABG with median sternotomy sutures in place. Mild aortic atherosclerosis is identified at the arch without aneurysm. Chronic interstitial prominence is noted of the lungs with medial left basilar atelectasis. No acute pulmonary consolidation, effusion or pneumothorax. Osteoarthritis of the glenohumeral joint. No acute nor suspicious osseous lesions. IMPRESSION: No active disease. Electronically Signed   By: Tollie Eth M.D.   On: 12/03/2018 00:31    ASSESSMENT AND PLAN:   Active Problems:   Sepsis (HCC)  14M w/ PMHx T2IDDM, HTN, HLD, CAD/MI (s/p CABG x4 + MVR, 1999; s/p stents), Hx CHF (EF 55% as of 04/17/2017 Echo), Afib (Coumadin, goal INR 2.0-3.0 per note by Dr. Darrold Junker, 10/29/2018) p/w rigors, N/V, fever, nocturia, sepsis, suspected L pyelonephritis. Hyperglycemia (w/ T2IDDM), Cr elevation/CKD III, lactate elevation, mild leukocytosis, macrocytic anemia. Subtherapeutic INR.  * Sepsis-  suspected L pyelonephritis, lactate elevation, mild leukocytosis:   High-grade fever (T 104F) in ED, tachycardic, tachypneic; WBC high. (+) nocturia (urinating 4-5x/night) x3-4wks; (-) burning, pain, dysuria, frequency, urgency, hesitancy, dribbling, incontinence, pyuria, hematuria. Appears ill/toxic.  CT (+) "Heterogeneous enhancement of the left  kidney mild perinephric edema, suspicious for acute pyelonephritis." U/A negartive. UCx, BCx sent. PCT < 0.10.   Started on broad-spectrum ABx in ED (Vancomycin + Cefepime + Flagyl)  IVF, symptomatic mgmt.  * Hyperglycemia, T2IDDM: SSI. 75/25 BID, reduced dose.  * CKD III: Cr 1.44 on present admission, baseline 1.3-1.5. Baseline CKD III (2/2 DM, HTN, aged kidney). IVF. Monitor BMP, avoid nephrotoxins.  * Macrocytic anemia: B12, folate pending.  * Subtherapeutic INR: Coumadin, goal INR 2.0-3.0. INR 1.74 on present admission. C/w present dosing, may need adjustment. Receiving Flagyl (for now), expected to derange INR. Pharmacy to help dosing.   All the records are reviewed and case discussed with Care Management/Social Workerr. Management plans discussed with the patient, family and they are in agreement.  CODE STATUS: Full  TOTAL TIME TAKING CARE OF THIS PATIENT: 35 minutes.     POSSIBLE D/C IN 1-2 DAYS, DEPENDING ON CLINICAL CONDITION.   Altamese Dilling M.D on 12/03/2018   Between  7am to 6pm - Pager - [phone]  After 6pm go to [email or web address] - [email or web address]  [facility]  Office  [phone]  CC: Primary care physician; Danella Penton, MD  Note: This dictation was prepared with Dragon dictation along with smaller phrase technology. Any transcriptional errors that result from this process are unintentional.

## 2018-12-03 NOTE — ED Triage Notes (Signed)
Patient brought in by ems. Patient states that he started having chills and nausea about 16:30 today.

## 2018-12-03 NOTE — Progress Notes (Signed)
PHARMACY - PHYSICIAN COMMUNICATION CRITICAL VALUE ALERT - BLOOD CULTURE IDENTIFICATION (BCID)  Results for orders placed or performed during the hospital encounter of 12/02/18  Blood Culture ID Panel (Reflexed) (Collected: 12/03/2018 12:09 AM)  Result Value Ref Range   Enterococcus species NOT DETECTED NOT DETECTED   Listeria monocytogenes NOT DETECTED NOT DETECTED   Staphylococcus species NOT DETECTED NOT DETECTED   Staphylococcus aureus (BCID) NOT DETECTED NOT DETECTED   Streptococcus species DETECTED (A) NOT DETECTED   Streptococcus agalactiae DETECTED (A) NOT DETECTED   Streptococcus pneumoniae NOT DETECTED NOT DETECTED   Streptococcus pyogenes NOT DETECTED NOT DETECTED   Acinetobacter baumannii NOT DETECTED NOT DETECTED   Enterobacteriaceae species NOT DETECTED NOT DETECTED   Enterobacter cloacae complex NOT DETECTED NOT DETECTED   Escherichia coli NOT DETECTED NOT DETECTED   Klebsiella oxytoca NOT DETECTED NOT DETECTED   Klebsiella pneumoniae NOT DETECTED NOT DETECTED   Proteus species NOT DETECTED NOT DETECTED   Serratia marcescens NOT DETECTED NOT DETECTED   Haemophilus influenzae NOT DETECTED NOT DETECTED   Neisseria meningitidis NOT DETECTED NOT DETECTED   Pseudomonas aeruginosa NOT DETECTED NOT DETECTED   Candida albicans NOT DETECTED NOT DETECTED   Candida glabrata NOT DETECTED NOT DETECTED   Candida krusei NOT DETECTED NOT DETECTED   Candida parapsilosis NOT DETECTED NOT DETECTED   Candida tropicalis NOT DETECTED NOT DETECTED    Name of physician (or Provider) Contacted: Sudini   Changes to prescribed antibiotics required: Yes, will d/c Vanc , Cefepime and flagyl and start Cefazolin 2 gm IV Q8H.   Nakeya Adinolfi D 12/03/2018  7:27 PM

## 2018-12-03 NOTE — ED Notes (Signed)
Report to alicia, rn.  

## 2018-12-04 DIAGNOSIS — L84 Corns and callosities: Secondary | ICD-10-CM

## 2018-12-04 DIAGNOSIS — Z951 Presence of aortocoronary bypass graft: Secondary | ICD-10-CM

## 2018-12-04 DIAGNOSIS — I4891 Unspecified atrial fibrillation: Secondary | ICD-10-CM

## 2018-12-04 DIAGNOSIS — E119 Type 2 diabetes mellitus without complications: Secondary | ICD-10-CM

## 2018-12-04 DIAGNOSIS — M79671 Pain in right foot: Secondary | ICD-10-CM

## 2018-12-04 DIAGNOSIS — B951 Streptococcus, group B, as the cause of diseases classified elsewhere: Secondary | ICD-10-CM

## 2018-12-04 DIAGNOSIS — Z952 Presence of prosthetic heart valve: Secondary | ICD-10-CM

## 2018-12-04 DIAGNOSIS — Z888 Allergy status to other drugs, medicaments and biological substances status: Secondary | ICD-10-CM

## 2018-12-04 DIAGNOSIS — I2510 Atherosclerotic heart disease of native coronary artery without angina pectoris: Secondary | ICD-10-CM

## 2018-12-04 DIAGNOSIS — Z880 Allergy status to penicillin: Secondary | ICD-10-CM

## 2018-12-04 DIAGNOSIS — E785 Hyperlipidemia, unspecified: Secondary | ICD-10-CM

## 2018-12-04 DIAGNOSIS — R251 Tremor, unspecified: Secondary | ICD-10-CM

## 2018-12-04 DIAGNOSIS — Z955 Presence of coronary angioplasty implant and graft: Secondary | ICD-10-CM

## 2018-12-04 DIAGNOSIS — R7881 Bacteremia: Secondary | ICD-10-CM

## 2018-12-04 DIAGNOSIS — Z885 Allergy status to narcotic agent status: Secondary | ICD-10-CM

## 2018-12-04 DIAGNOSIS — I10 Essential (primary) hypertension: Secondary | ICD-10-CM

## 2018-12-04 LAB — GLUCOSE, CAPILLARY
Glucose-Capillary: 120 mg/dL — ABNORMAL HIGH (ref 70–99)
Glucose-Capillary: 242 mg/dL — ABNORMAL HIGH (ref 70–99)
Glucose-Capillary: 230 mg/dL — ABNORMAL HIGH (ref 70–99)
Glucose-Capillary: 205 mg/dL — ABNORMAL HIGH (ref 70–99)

## 2018-12-04 LAB — PROCALCITONIN: Procalcitonin: 0.14 ng/mL

## 2018-12-04 LAB — PHOSPHORUS: Phosphorus: 5 mg/dL — ABNORMAL HIGH (ref 2.5–4.6)

## 2018-12-04 LAB — BASIC METABOLIC PANEL
Anion gap: 6 (ref 5–15)
BUN: 18 mg/dL (ref 8–23)
CALCIUM: 8.3 mg/dL — AB (ref 8.9–10.3)
CO2: 23 mmol/L (ref 22–32)
CREATININE: 1.44 mg/dL — AB (ref 0.61–1.24)
Chloride: 110 mmol/L (ref 98–111)
GFR calc Af Amer: 53 mL/min — ABNORMAL LOW (ref >60)
GFR calc non Af Amer: 46 mL/min — ABNORMAL LOW (ref >60)
Glucose, Bld: 137 mg/dL — ABNORMAL HIGH (ref 70–99)
Potassium: 4.8 mmol/L (ref 3.5–5.1)
Sodium: 139 mmol/L (ref 135–145)

## 2018-12-04 LAB — PROTIME-INR
INR: 1.93
PROTHROMBIN TIME: 21.8 s — AB (ref 11.4–15.2)

## 2018-12-04 LAB — MAGNESIUM: Magnesium: 2.1 mg/dL (ref 1.7–2.4)

## 2018-12-04 MED ORDER — SODIUM CHLORIDE 0.9% FLUSH
10.0000 mL | Freq: Two times a day (BID) | INTRAVENOUS | Status: DC
  Administered 2018-12-04 – 2018-12-07 (×5): 10 mL via INTRAVENOUS

## 2018-12-04 MED ORDER — SODIUM CHLORIDE 0.9 % IV SOLN
2.0000 g | INTRAVENOUS | Status: DC
  Administered 2018-12-05 – 2018-12-07 (×3): 2 g via INTRAVENOUS
  Filled 2018-12-04: qty 2
  Filled 2018-12-04 (×2): qty 20

## 2018-12-04 NOTE — Consult Note (Addendum)
NAME: Jeffrey Mills Consiglio  DOB: 08/21/1936  MRN: [id number]  Date/Time: 12/04/2018 5:19 PM  Dr.Vacchani Subjective:  REASON FOR CONSULT: Group B streptococcus bacteremia ? Jeffrey Mills Iseman is a [age] y.o. male with a history of diabetes mellitus, hypertension, hyperlipidemia, coronary artery disease status post CABG 24, mitral valve repair, status post stents, A. fib presents with fever rigors and feeling weak and rundown.  Pt apparently was well on Sunday and his daughter brought food from [employer] ( oat meal, eggs, bacon, biscuit) .  HE drove to [employer] and got a chicken biscuit and dirty rice  at 4pm. 2 hours later  His daughter called him at 6.30 pM and he was shaking with chills. At 8.30 pm he was c/o left shoulder and left arm pain and daughter called EMS. He was nauseous and when he came to the ED he vomited. He did not have any abdominal cramping or diarrhea. In the ED temp was 104 and BP 153/81, WBC was 10.9. Blood culture was sent and it came back positive for GB streptococcus and I am asked to see the patient. HE is on vanco, cefepime and flagyl Pt says his daughter had bacteremia and was getting IV antibiotics. HE lives on his own and is pretty independent. He c/o rt foot pain which he thinks is due to Gout Past Medical History:  Diagnosis Date  . Anemia   . Anxiety   . Arthritis   . Benign hypertension   . Cardiopulmonary arrest (HCC)   . CHF (congestive heart failure) (HCC)   . Coronary atherosclerosis of native coronary artery   . Depression   . Diabetes (HCC)   . Glaucoma   . Hyperlipidemia   . Mitral valve disorder   . Neuralgia   . PVD (peripheral vascular disease) (HCC)     Past Surgical History:  Procedure Laterality Date  . CHOLECYSTECTOMY    . CORONARY ANGIOPLASTY WITH STENT PLACEMENT    . CORONARY ARTERY BYPASS GRAFT    . MITRAL VALVE ANNULOPLASTY       SH Non smoker No alcohol   Family History  Problem Relation Age of Onset  . Heart disease Father    . ALS Father   . Hypertension Mother   . Breast cancer Mother   . Heart attack Paternal Uncle   . Diabetes Son   . Heart disease Son    Allergies  Allergen Reactions  . Captopril   . Morphine And Related   . Penicillins    ? Current Facility-Administered Medications  Medication Dose Route Frequency Provider Last Rate Last Dose  . acetaminophen (TYLENOL) tablet 650 mg  650 mg Oral Q6H PRN Barbaraann RondoSridharan, Prasanna, MD       Or  . acetaminophen (TYLENOL) suppository 650 mg  650 mg Rectal Q6H PRN Barbaraann RondoSridharan, Prasanna, MD      . ALPRAZolam Prudy Feeler(XANAX) tablet 0.5 mg  0.5 mg Oral QHS PRN Barbaraann RondoSridharan, Prasanna, MD   0.5 mg at 12/03/18 2105  . aspirin EC tablet 81 mg  81 mg Oral Daily Barbaraann RondoSridharan, Prasanna, MD   81 mg at 12/04/18 0940  . bisacodyl (DULCOLAX) EC tablet 5 mg  5 mg Oral Daily PRN Barbaraann RondoSridharan, Prasanna, MD      . brimonidine (ALPHAGAN) 0.2 % ophthalmic solution 1 drop  1 drop Both Eyes BID Barbaraann RondoSridharan, Prasanna, MD   1 drop at 12/04/18 0940  . buPROPion (WELLBUTRIN XL) 24 hr tablet 150 mg  150 mg Oral Daily Barbaraann RondoSridharan, Prasanna, MD  150 mg at 12/04/18 0940  . ceFAZolin (ANCEF) IVPB 2g/100 mL premix  2 g Intravenous Q8H Altamese Dilling, MD 200 mL/hr at 12/04/18 1409 2 g at 12/04/18 1409  . clopidogrel (PLAVIX) tablet 75 mg  75 mg Oral Daily Barbaraann Rondo, MD   75 mg at 12/04/18 0940  . escitalopram (LEXAPRO) tablet 5 mg  5 mg Oral Daily Barbaraann Rondo, MD   5 mg at 12/04/18 0941  . ferrous gluconate (FERGON) tablet 324 mg  324 mg Oral Q breakfast Barbaraann Rondo, MD   324 mg at 12/04/18 0940  . gabapentin (NEURONTIN) capsule 100 mg  100 mg Oral TID Barbaraann Rondo, MD   100 mg at 12/04/18 1559  . insulin aspart (novoLOG) injection 0-15 Units  0-15 Units Subcutaneous TID WC Barbaraann Rondo, MD   5 Units at 12/04/18 1225  . insulin aspart (novoLOG) injection 0-5 Units  0-5 Units Subcutaneous QHS Sridharan, Prasanna, MD      . insulin aspart protamine- aspart (NOVOLOG MIX  70/30) injection 25 Units  25 Units Subcutaneous BID WC Barbaraann Rondo, MD   25 Units at 12/04/18 0941  . latanoprost (XALATAN) 0.005 % ophthalmic solution 1 drop  1 drop Both Eyes BID Barbaraann Rondo, MD   1 drop at 12/03/18 1026  . metoprolol succinate (TOPROL-XL) 24 hr tablet 25 mg  25 mg Oral Daily Barbaraann Rondo, MD   25 mg at 12/04/18 0942  . ondansetron (ZOFRAN) tablet 4 mg  4 mg Oral Q6H PRN Barbaraann Rondo, MD       Or  . ondansetron (ZOFRAN) injection 4 mg  4 mg Intravenous Q6H PRN Barbaraann Rondo, MD      . pantoprazole (PROTONIX) EC tablet 40 mg  40 mg Oral Daily Barbaraann Rondo, MD   40 mg at 12/04/18 0940  . senna-docusate (Senokot-S) tablet 1 tablet  1 tablet Oral QHS PRN Barbaraann Rondo, MD      . sertraline (ZOLOFT) tablet 50 mg  50 mg Oral Daily Barbaraann Rondo, MD   50 mg at 12/04/18 0941  . simvastatin (ZOCOR) tablet 40 mg  40 mg Oral Daily Barbaraann Rondo, MD   40 mg at 12/04/18 0940  . warfarin (COUMADIN) tablet 7.5 mg  7.5 mg Oral q1800 Barbaraann Rondo, MD   7.5 mg at 12/03/18 2105     Abtx:  Anti-infectives (From admission, onward)   Start     Dose/Rate Route Frequency Ordered Stop   12/03/18 2000  ceFAZolin (ANCEF) IVPB 2g/100 mL premix     2 g 200 mL/hr over 30 Minutes Intravenous Every 8 hours 12/03/18 1927     12/03/18 1200  ceFEPIme (MAXIPIME) 2 g in sodium chloride 0.9 % 100 mL IVPB  Status:  Discontinued     2 g 200 mL/hr over 30 Minutes Intravenous Every 12 hours 12/03/18 0241 12/03/18 1916   12/03/18 0600  vancomycin (VANCOCIN) IVPB 1000 mg/200 mL premix  Status:  Discontinued     1,000 mg 200 mL/hr over 60 Minutes Intravenous Every 12 hours 12/03/18 0241 12/03/18 0242   12/03/18 0600  vancomycin (VANCOCIN) IVPB 750 mg/150 ml premix  Status:  Discontinued     750 mg 150 mL/hr over 60 Minutes Intravenous Every 12 hours 12/03/18 0242 12/03/18 1917   12/03/18 0015  ceFEPIme (MAXIPIME) 2 g in sodium chloride 0.9 % 100  mL IVPB     2 g 200 mL/hr over 30 Minutes Intravenous  Once 12/03/18 0001 12/03/18 0123   12/03/18 0015  metroNIDAZOLE (FLAGYL) IVPB 500 mg  Status:  Discontinued     500 mg 100 mL/hr over 60 Minutes Intravenous Every 8 hours 12/03/18 0001 12/03/18 1916   12/03/18 0015  vancomycin (VANCOCIN) IVPB 1000 mg/200 mL premix     1,000 mg 200 mL/hr over 60 Minutes Intravenous  Once 12/03/18 0001 12/03/18 0154      REVIEW OF SYSTEMS:  Const: negative fever, negative chills, negative weight loss Eyes: negative diplopia or visual changes, negative eye pain ENT: negative coryza, negative sore throat Resp: negative cough, hemoptysis, dyspnea Cards: negative for chest pain, palpitations, lower extremity edema GU: negative for frequency, dysuria and hematuria GI: Negative for abdominal pain, diarrhea, bleeding, constipation Skin: negative for rash and pruritus Heme: negative for easy bruising and gum/nose bleeding MS: negative for myalgias, arthralgias, back pain and muscle weakness Neurolo:negative for headaches, dizziness, vertigo, memory problems  Psych: negative for feelings of anxiety, depression  Endocrine: negative for thyroid, diabetes Allergy/Immunology- negative for any medication or food allergies ? Pertinent Positives include : Objective:  VITALS:  BP (!) 146/76 (BP Location: Left Arm)   Pulse 79   Temp 99.6 F (37.6 C) (Oral)   Resp 19   Ht 5\' 10"  (1.778 m)   Wt 110.7 kg   SpO2 96%   BMI 35.01 kg/m  PHYSICAL EXAM:  General: Alert, cooperative, no distress, appears stated age. Tremors left hand Head: Normocephalic, without obvious abnormality, atraumatic. Eyes: Conjunctivae clear, anicteric sclerae. Pupils are equal ENT Nares normal. No drainage or sinus tenderness. Lips, mucosa, and tongue normal. No Thrush Neck: Supple, symmetrical, no adenopathy, thyroid: non tender no carotid bruit and no JVD. Back: No CVA tenderness. Lungs: Clear to auscultation bilaterally. No  Wheezing or Rhonchi. No rales. Heart: Sternal scar- s1s2  Abdomen: Soft, non-tender,not distended. Bowel sounds normal. No masses Extremities:rt great toe- small callus Tender over mid foot   atraumatic, no cyanosis. No edema. No clubbing Skin: No rashes or lesions. Or bruising Lymph: Cervical, supraclavicular normal. Neurologic: Grossly non-focal Pertinent Labs Lab Results CBC    Component Value Date/Time   WBC 10.9 (H) 12/02/2018 2353   RBC 3.27 (L) 12/02/2018 2353   HGB 11.2 (L) 12/02/2018 2353   HCT 33.4 (L) 12/02/2018 2353   PLT 175 12/02/2018 2353   MCV 102.1 (H) 12/02/2018 2353   MCH 34.3 (H) 12/02/2018 2353   MCHC 33.5 12/02/2018 2353   RDW 14.6 12/02/2018 2353   LYMPHSABS 1.0 12/02/2018 2353   MONOABS 0.9 12/02/2018 2353   EOSABS 0.1 12/02/2018 2353   BASOSABS 0.0 12/02/2018 2353    CMP Latest Ref Rng & Units 12/04/2018 12/02/2018 07/24/2017  Glucose 70 - 99 mg/dL 748(O) 707(E) 675(Q)  BUN 8 - 23 mg/dL 18 19 49(E)  Creatinine 0.61 - 1.24 mg/dL 0.10(O) 7.12(R) 9.75(O)  Sodium 135 - 145 mmol/L 139 137 139  Potassium 3.5 - 5.1 mmol/L 4.8 4.6 4.7  Chloride 98 - 111 mmol/L 110 108 107  CO2 22 - 32 mmol/L 23 20(L) 23  Calcium 8.9 - 10.3 mg/dL 8.3(L) 8.9 9.4  Total Protein 6.5 - 8.1 g/dL - 7.7 7.9  Total Bilirubin 0.3 - 1.2 mg/dL - 1.0 0.7  Alkaline Phos 38 - 126 U/L - 63 63  AST 15 - 41 U/L - 28 35  ALT 0 - 44 U/L - 24 26      Microbiology: Recent Results (from the past 240 hour(s))  Blood Culture (routine x 2)     Status: None (Preliminary result)  Collection Time: 12/03/18 12:08 AM  Result Value Ref Range Status   Spec[facility]  Final   Special Requests   Final    BOTTLES DRAWN AEROBIC AND ANAEROBIC Blood Culture adequate volume   Culture   Final    NO GROWTH 1 DAY Performed at [facility], [address] [place], [place] [address]    Report Status PENDING  Incomplete  Blood Culture (routine x 2)     Status: None (Preliminary  result)   Collection Time: 12/03/18 12:09 AM  Result Value Ref Range Status   Specimen Description BLOOD RT H  Final   Special Requests   Final    BOTTLES DRAWN AEROBIC AND ANAEROBIC Blood Culture results may not be optimal due to an inadequate volume of blood received in culture bottles   Culture  Setup Time   Final    Organism ID to follow ANAEROBIC BOTTLE ONLY GRAM POSITIVE COCCI CRITICAL RESULT CALLED TO, READ BACK BY AND VERIFIED WITH: JASON ROBINS AT 1733 12/03/2018.  TFK Performed at [facility], [address] [place], [place] [address]    Culture [facility]  Final   Report Status PENDING  Incomplete  Blood Culture ID Panel (Reflexed)     Status: Abnormal   Collection Time: 12/03/18 12:09 AM  Result Value Ref Range Status   Enterococcus species NOT DETECTED NOT DETECTED Final   Listeria monocytogenes NOT DETECTED NOT DETECTED Final   Staphylococcus species NOT DETECTED NOT DETECTED Final   Staphylococcus aureus (BCID) NOT DETECTED NOT DETECTED Final   Streptococcus species DETECTED (A) NOT DETECTED Final    Comment: CRITICAL RESULT CALLED TO, READ BACK BY AND VERIFIED WITH: JASON ROBINS AT 1733 12/03/2018.  TFK    Streptococcus agalactiae DETECTED (A) NOT DETECTED Final    Comment: CRITICAL RESULT CALLED TO, READ BACK BY AND VERIFIED WITH: JASON ROBINS AT 1733 12/03/2018.  TFK    Streptococcus pneumoniae NOT DETECTED NOT DETECTED Final   Streptococcus pyogenes NOT DETECTED NOT DETECTED Final   Acinetobacter baumannii NOT DETECTED NOT DETECTED Final   Enterobacteriaceae species NOT DETECTED NOT DETECTED Final   Enterobacter cloacae complex NOT DETECTED NOT DETECTED Final   Escherichia coli NOT DETECTED NOT DETECTED Final   Klebsiella oxytoca NOT DETECTED NOT DETECTED Final   Klebsiella pneumoniae NOT DETECTED NOT DETECTED Final   Proteus species NOT DETECTED NOT DETECTED Final   Serratia marcescens NOT DETECTED NOT DETECTED Final   Haemophilus  influenzae NOT DETECTED NOT DETECTED Final   Neisseria meningitidis NOT DETECTED NOT DETECTED Final   Pseudomonas aeruginosa NOT DETECTED NOT DETECTED Final   Candida albicans NOT DETECTED NOT DETECTED Final   Candida glabrata NOT DETECTED NOT DETECTED Final   Candida krusei NOT DETECTED NOT DETECTED Final   Candida parapsilosis NOT DETECTED NOT DETECTED Final   Candida tropicalis NOT DETECTED NOT DETECTED Final    Comment: Performed at [facility], [address] [place], [place] [address]   IMAGING RESULTS: ? Impression/Recommendation ? Group B streptococcus bacteremia- no source /site identified-  ?sudden onset illness on Sunday- No skin lesions , NO UTI Could it be a GI source?? As he has mitral valve replacement  will need TEE to r/o endocarditis Change cefazolin to ceftriaxone CT findings of rt perinephric edema does not correlate with clinical findings of pyelonephritis or UTI  CAD/S/p CABG   DM-management as per primary team   ? ___________________________________________________ Discussed with patient, and requesting provider Note:  This document was prepared using Dragon voice recognition software and may include unintentional dictation errors.

## 2018-12-04 NOTE — Consult Note (Signed)
PHARMACY CONSULT NOTE - FOLLOW UP  Pharmacy Consult for Electrolyte Monitoring and Replacement   Recent Labs: Potassium (mmol/L)  Date Value  12/04/2018 4.8   Magnesium (mg/dL)  Date Value  41/93/7902 2.1   Calcium (mg/dL)  Date Value  40/97/3532 8.3 (L)   Albumin (g/dL)  Date Value  99/24/2683 4.4   Phosphorus (mg/dL)  Date Value  41/96/2229 5.0 (H)   Sodium (mmol/L)  Date Value  12/04/2018 139     Assessment: 83 y.o. male with a known history of T2IDDM, HTN, HLD, CAD/MI, Hx CHF, Afib p/w rigors, fever, sepsis. Pt is AAOx3, but states he feels very weak and tired.  Goal of Therapy:  K ~4.0 Mg ~2.0 Phos 2.5-4.6  Plan:  Mg - 2.1, WNL and no replacement needed  Phos - 5.0, which is slightly elevated s/p replacement.  Will monitor with additional lab tomorrow morning  K - 4.8, No replacement needed  Will recheck BMP and Phos with AM labs.  Orinda Kenner, PharmD Clinical Pharmacist 12/04/2018 11:25 AM

## 2018-12-04 NOTE — Progress Notes (Signed)
ID Full consult note to follow Group B streptococcus bacteremia-unclear source Mitral valve replacement R/o endocarditis- needs TEE

## 2018-12-04 NOTE — Progress Notes (Signed)
ANTICOAGULATION CONSULT NOTE - Initial Consult  Pharmacy Consult for warfarin Indication: atrial fibrillation  Allergies  Allergen Reactions  . Captopril   . Morphine And Related   . Penicillins     Patient Measurements: Height: 5\' 10"  (177.8 cm) Weight: 244 lb (110.7 kg) IBW/kg (Calculated) : 73 Heparin Dosing Weight: 96 kg  Vital Signs: Temp: 99.1 F (37.3 C) (01/07 0825) Temp Source: Oral (01/07 0825) BP: 158/73 (01/07 0825) Pulse Rate: 77 (01/07 0825)  Labs: Recent Labs    12/02/18 2353 12/03/18 0040 12/04/18 0638  HGB 11.2*  --   --   HCT 33.4*  --   --   PLT 175  --   --   LABPROT  --  20.1* 21.8*  INR  --  1.74 1.93  CREATININE 1.44*  --  1.44*  TROPONINI <0.03  --   --     Estimated Creatinine Clearance: 51 mL/min (A) (by C-G formula based on SCr of 1.44 mg/dL (H)).   Medical History: Past Medical History:  Diagnosis Date  . Anemia   . Anxiety   . Arthritis   . Benign hypertension   . Cardiopulmonary arrest (HCC)   . CHF (congestive heart failure) (HCC)   . Coronary atherosclerosis of native coronary artery   . Depression   . Diabetes (HCC)   . Glaucoma   . Hyperlipidemia   . Mitral valve disorder   . Neuralgia   . PVD (peripheral vascular disease) (HCC)     Medications:  Scheduled:  . aspirin EC  81 mg Oral Daily  . brimonidine  1 drop Both Eyes BID  . buPROPion  150 mg Oral Daily  . clopidogrel  75 mg Oral Daily  . escitalopram  5 mg Oral Daily  . ferrous gluconate  324 mg Oral Q breakfast  . gabapentin  100 mg Oral TID  . insulin aspart  0-15 Units Subcutaneous TID WC  . insulin aspart  0-5 Units Subcutaneous QHS  . insulin aspart protamine- aspart  25 Units Subcutaneous BID WC  . latanoprost  1 drop Both Eyes BID  . metoprolol succinate  25 mg Oral Daily  . pantoprazole  40 mg Oral Daily  . sertraline  50 mg Oral Daily  . simvastatin  40 mg Oral Daily  . warfarin  7.5 mg Oral q1800    Assessment: Patient arrived for chills  and is flu +. Patient also w/ h/o afib is anticoagulated w/ warfarin PTA. Warfarin home dose: 7.5 mg daily  01/06 INR 1.74 01/07 INR 1.93  Goal of Therapy:  INR 2-3 Monitor platelets by anticoagulation protocol: Yes   Plan:  INR currently subtherapeutic but increased from 1.74 to 1.93 Will continue patient's regimen of warfarin 7.5 mg daily @ 1800 Will monitor daily CBC's and adjust per daily INRs  Abbie Sons, PharmD Clinical Pharmacist 12/04/2018

## 2018-12-04 NOTE — Consult Note (Signed)
I was asked to see patients in regards to possible lesions to lower extremities.  Patient was seen and evaluated.  There were no areas of concern.  Small hyperkeratotic lesion at the interphalangeal joint of the great toe was noted with hemorrhagic tissue.  No underlying ulceration or signs of infection.  Remainder of the skin was intact.  No concerns to the lower extremity at this time.

## 2018-12-05 ENCOUNTER — Encounter
Admission: EM | Disposition: A | Discharge status: Home Health | Payer: Self-pay | Source: Home / Self Care | Attending: Internal Medicine

## 2018-12-05 ENCOUNTER — Inpatient Hospital Stay
Admit: 2018-12-05 | Discharge: 2018-12-05 | Disposition: A | Discharge status: Home / Self Care | Payer: Medicare Other | Attending: Cardiology | Admitting: Cardiology

## 2018-12-05 DIAGNOSIS — M25512 Pain in left shoulder: Secondary | ICD-10-CM

## 2018-12-05 DIAGNOSIS — G8929 Other chronic pain: Secondary | ICD-10-CM

## 2018-12-05 HISTORY — PX: TEE WITHOUT CARDIOVERSION: SHX5443

## 2018-12-05 LAB — CBC
HCT: 30.1 % — ABNORMAL LOW (ref 39.0–52.0)
Hemoglobin: 10 g/dL — ABNORMAL LOW (ref 13.0–17.0)
MCH: 33.8 pg (ref 26.0–34.0)
MCHC: 33.2 g/dL (ref 30.0–36.0)
MCV: 101.7 fL — ABNORMAL HIGH (ref 80.0–100.0)
Platelets: 141 10*3/uL — ABNORMAL LOW (ref 150–400)
RBC: 2.96 MIL/uL — ABNORMAL LOW (ref 4.22–5.81)
RDW: 14.7 % (ref 11.5–15.5)
WBC: 9 10*3/uL (ref 4.0–10.5)
nRBC: 0 % (ref 0.0–0.2)

## 2018-12-05 LAB — GLUCOSE, CAPILLARY
GLUCOSE-CAPILLARY: 164 mg/dL — AB (ref 70–99)
Glucose-Capillary: 138 mg/dL — ABNORMAL HIGH (ref 70–99)
Glucose-Capillary: 126 mg/dL — ABNORMAL HIGH (ref 70–99)
Glucose-Capillary: 125 mg/dL — ABNORMAL HIGH (ref 70–99)
Glucose-Capillary: 99 mg/dL (ref 70–99)

## 2018-12-05 LAB — PROTIME-INR
INR: 2.15
Prothrombin Time: 23.7 seconds — ABNORMAL HIGH (ref 11.4–15.2)

## 2018-12-05 LAB — BASIC METABOLIC PANEL
Anion gap: 8 (ref 5–15)
BUN: 18 mg/dL (ref 8–23)
CALCIUM: 8.5 mg/dL — AB (ref 8.9–10.3)
CO2: 22 mmol/L (ref 22–32)
Chloride: 105 mmol/L (ref 98–111)
Creatinine, Ser: 1.4 mg/dL — ABNORMAL HIGH (ref 0.61–1.24)
GFR calc Af Amer: 55 mL/min — ABNORMAL LOW (ref >60)
GFR calc non Af Amer: 47 mL/min — ABNORMAL LOW (ref >60)
GLUCOSE: 154 mg/dL — AB (ref 70–99)
Potassium: 4.5 mmol/L (ref 3.5–5.1)
Sodium: 135 mmol/L (ref 135–145)

## 2018-12-05 LAB — PROCALCITONIN: PROCALCITONIN: 0.22 ng/mL

## 2018-12-05 LAB — PHOSPHORUS: Phosphorus: 3.6 mg/dL (ref 2.5–4.6)

## 2018-12-05 SURGERY — ECHOCARDIOGRAM, TRANSESOPHAGEAL
Anesthesia: Moderate Sedation

## 2018-12-05 MED ORDER — SODIUM CHLORIDE FLUSH 0.9 % IV SOLN
INTRAVENOUS | Status: AC
  Administered 2018-12-05: 12:00:00
  Filled 2018-12-05: qty 100

## 2018-12-05 MED ORDER — MIDAZOLAM HCL 5 MG/5ML IJ SOLN
INTRAMUSCULAR | Status: AC
  Administered 2018-12-05: 12:00:00
  Filled 2018-12-05: qty 10

## 2018-12-05 MED ORDER — SODIUM CHLORIDE 0.9 % IV SOLN: INTRAVENOUS | Status: DC

## 2018-12-05 MED ORDER — SODIUM CHLORIDE 0.9 % IV SOLN
INTRAVENOUS | Status: DC | PRN
  Administered 2018-12-05: 50 mL via INTRAVENOUS
  Administered 2018-12-06: 1000 mL via INTRAVENOUS
  Administered 2018-12-07: 500 mL via INTRAVENOUS

## 2018-12-05 MED ORDER — LIDOCAINE VISCOUS HCL 2 % MT SOLN
OROMUCOSAL | Status: AC
  Administered 2018-12-05: 10 mL
  Filled 2018-12-05: qty 15

## 2018-12-05 MED ORDER — SODIUM CHLORIDE 0.9 % IV SOLN
INTRAVENOUS | Status: DC
  Administered 2018-12-05: 10:00:00 via INTRAVENOUS

## 2018-12-05 MED ORDER — FENTANYL CITRATE (PF) 100 MCG/2ML IJ SOLN
INTRAMUSCULAR | Status: AC | PRN
  Administered 2018-12-05: 50 ug via INTRAVENOUS

## 2018-12-05 MED ORDER — BUTAMBEN-TETRACAINE-BENZOCAINE 2-2-14 % EX AERO
INHALATION_SPRAY | CUTANEOUS | Status: AC
  Administered 2018-12-05: 1
  Filled 2018-12-05: qty 5

## 2018-12-05 MED ORDER — FENTANYL CITRATE (PF) 100 MCG/2ML IJ SOLN
INTRAMUSCULAR | Status: AC
  Administered 2018-12-05: 12:00:00
  Filled 2018-12-05: qty 4

## 2018-12-05 MED ORDER — MIDAZOLAM HCL 2 MG/2ML IJ SOLN
INTRAMUSCULAR | Status: AC | PRN
  Administered 2018-12-05: 2 mg via INTRAVENOUS

## 2018-12-05 NOTE — Procedures (Signed)
   TRANSESOPHAGEAL ECHOCARDIOGRAM   NAME:  Jeffrey Mills   MRN: [id number] DOB:  04/05/1936   ADMIT DATE: 12/02/2018  INDICATIONS:  Bacteremia PROCEDURE:   Informed consent was obtained prior to the procedure. The risks, benefits and alternatives for the procedure were discussed and the patient comprehended these risks.  Risks include, but are not limited to, cough, sore throat, vomiting, nausea, somnolence, esophageal and stomach trauma or perforation, bleeding, low blood pressure, aspiration, pneumonia, infection, trauma to the teeth and death.    After a procedural time-out, the patient was given 2 mg versed and 50 mcg fentanyl to achieve moderate sedation for 10 min.  The oropharynx was anesthetized 3 cc of topical 1% viscous lidocaine.  The transesophageal probe was inserted in the esophagus and stomach without difficulty and multiple views were obtained. I was present for the entire procedure.    COMPLICATIONS:    There were no immediate complications.  FINDINGS:  LEFT VENTRICLE: EF = 55%. No regional wall motion abnormalities.  RIGHT VENTRICLE: Normal size and function.   LEFT ATRIUM: Normal size. No thrombi  LEFT ATRIAL APPENDAGE: No thrombus. Good doppler flow  RIGHT ATRIUM: mildly dilated. No thrombus  AORTIC VALVE:  Trileaflet. Trivial ai. No vegetations  MITRAL VALVE:    Mitral annulus thickening. No vegetations. Trivial mr   TRICUSPID VALVE: Normal. No vegetations. Moderate tr  PULMONIC VALVE: Grossly normal.  INTERATRIAL SEPTUM: No PFO or ASD. Agitated saline contrast was used.   PERICARDIUM: No effusion  DESCENDING AORTA: Mild plaque. No masses.  CONCLUSION:  No vegetations . No evidence of right to left shunt Moderate tr with no vegetations Repaired mitral valve with no vegations. Trileaflet aortic valve. No vegetations.

## 2018-12-05 NOTE — Consult Note (Signed)
PHARMACY CONSULT NOTE - FOLLOW UP  Pharmacy Consult for Electrolyte Monitoring and Replacement   Recent Labs: Potassium (mmol/L)  Date Value  12/05/2018 4.5   Magnesium (mg/dL)  Date Value  96/29/5284 2.1   Calcium (mg/dL)  Date Value  13/24/4010 8.5 (L)   Albumin (g/dL)  Date Value  27/25/3664 4.4   Phosphorus (mg/dL)  Date Value  40/34/7425 3.6   Sodium (mmol/L)  Date Value  12/05/2018 135     Assessment: 83 y.o. male with a known history of T2IDDM, HTN, HLD, CAD/MI, Hx CHF, Afib p/w rigors, fever, sepsis. Pt is AAOx3, but states he feels very weak and tired.  Goal of Therapy:  K ~4.0 Mg ~2.0 Phos 2.5-4.6  Plan:  Phos - 3.6, WNL.  No replacement needed.  K - 4.5, No replacement needed  Will recheck BMP with AM labs.  If remained WNL with minimal change will consider signing off.  Orinda Kenner, PharmD Clinical Pharmacist 12/05/2018 12:55 PM

## 2018-12-05 NOTE — Progress Notes (Signed)
*  PRELIMINARY RESULTS* Echocardiogram Echocardiogram Transesophageal has been performed.  Cristela Blue 12/05/2018, 11:03 AM

## 2018-12-05 NOTE — Progress Notes (Signed)
Patient returned to room 250, nurse Serenity at the bedside. Top dentures left at the bedside with patient.

## 2018-12-05 NOTE — Progress Notes (Signed)
ANTICOAGULATION CONSULT NOTE - Initial Consult  Pharmacy Consult for warfarin Indication: atrial fibrillation  Allergies  Allergen Reactions  . Captopril   . Morphine And Related   . Penicillins     Patient Measurements: Height: 5\' 10"  (177.8 cm) Weight: 239 lb (108.4 kg) IBW/kg (Calculated) : 73 Heparin Dosing Weight: 96 kg  Vital Signs: Temp: 98.8 F (37.1 C) (01/08 0500) Temp Source: Oral (01/08 0500) BP: 123/53 (01/08 0348) Pulse Rate: 74 (01/08 0348)  Labs: Recent Labs    12/02/18 2353 12/03/18 0040 12/04/18 1324 12/05/18 0316  HGB 11.2*  --   --  10.0*  HCT 33.4*  --   --  30.1*  PLT 175  --   --  141*  LABPROT  --  20.1* 21.8* 23.7*  INR  --  1.74 1.93 2.15  CREATININE 1.44*  --  1.44* 1.40*  TROPONINI <0.03  --   --   --     Estimated Creatinine Clearance: 51.9 mL/min (A) (by C-G formula based on SCr of 1.4 mg/dL (H)).   Medical History: Past Medical History:  Diagnosis Date  . Anemia   . Anxiety   . Arthritis   . Benign hypertension   . Cardiopulmonary arrest (HCC)   . CHF (congestive heart failure) (HCC)   . Coronary atherosclerosis of native coronary artery   . Depression   . Diabetes (HCC)   . Glaucoma   . Hyperlipidemia   . Mitral valve disorder   . Neuralgia   . PVD (peripheral vascular disease) (HCC)     Medications:  Scheduled:  . aspirin EC  81 mg Oral Daily  . brimonidine  1 drop Both Eyes BID  . buPROPion  150 mg Oral Daily  . clopidogrel  75 mg Oral Daily  . escitalopram  5 mg Oral Daily  . ferrous gluconate  324 mg Oral Q breakfast  . gabapentin  100 mg Oral TID  . insulin aspart  0-15 Units Subcutaneous TID WC  . insulin aspart  0-5 Units Subcutaneous QHS  . insulin aspart protamine- aspart  25 Units Subcutaneous BID WC  . latanoprost  1 drop Both Eyes BID  . metoprolol succinate  25 mg Oral Daily  . pantoprazole  40 mg Oral Daily  . sertraline  50 mg Oral Daily  . simvastatin  40 mg Oral Daily  . sodium chloride  flush  10 mL Intravenous Q12H  . warfarin  7.5 mg Oral q1800    Assessment: Patient arrived for chills and is flu +. Patient also w/ h/o afib is anticoagulated w/ warfarin PTA. Warfarin home dose: 7.5 mg daily  01/06 INR 1.74 01/07 INR 1.93 01/08 INR 2.15  Goal of Therapy:  INR 2-3 Monitor platelets by anticoagulation protocol: Yes   Plan:  INR currently therapeutic at 2.15. Will continue patient's regimen of warfarin 7.5 mg daily @ 1800 Will monitor daily CBC's and adjust per daily INRs  Abbie Sons, PharmD Clinical Pharmacist 12/05/2018

## 2018-12-05 NOTE — Progress Notes (Signed)
[employer] -  at [facility]   PATIENT NAME: Jeffrey MassedLarry Jaquess    MR#:  [id number]  DATE OF BIRTH:  05/11/1936  SUBJECTIVE:  CHIEF COMPLAINT:   Chief Complaint  Patient presents with  . Fever   Came with fever and generalized weakness with shaking and chills. No clear source of infection yet, had some vomiting episodes at home. No diarrhea or abdominal pain. He have bacteremia on first set of cultures. No fever. REVIEW OF SYSTEMS:  CONSTITUTIONAL: Have fever, fatigue or weakness.  EYES: No blurred or double vision.  EARS, NOSE, AND THROAT: No tinnitus or ear pain.  RESPIRATORY: No cough, shortness of breath, wheezing or hemoptysis.  CARDIOVASCULAR: No chest pain, orthopnea, edema.  GASTROINTESTINAL: Have nausea, vomiting, no diarrhea or abdominal pain.  GENITOURINARY: No dysuria, hematuria.  ENDOCRINE: No polyuria, nocturia,  HEMATOLOGY: No anemia, easy bruising or bleeding SKIN: No rash or lesion. MUSCULOSKELETAL: No joint pain or arthritis.   NEUROLOGIC: No tingling, numbness, weakness.  PSYCHIATRY: No anxiety or depression.   ROS  DRUG ALLERGIES:   Allergies  Allergen Reactions  . Captopril   . Morphine And Related   . Penicillins     VITALS:  Blood pressure 120/70, pulse 64, temperature 99 F (37.2 C), temperature source Oral, resp. rate 20, height 5\' 10"  (1.778 m), weight 108.4 kg, SpO2 98 %.  PHYSICAL EXAMINATION:  GENERAL:  83 y.o.-year-old patient lying in the bed with no acute distress.  EYES: Pupils equal, round, reactive to light and accommodation. No scleral icterus. Extraocular muscles intact.  HEENT: Head atraumatic, normocephalic. Oropharynx and nasopharynx clear.  NECK:  Supple, no jugular venous distention. No thyroid enlargement, no tenderness.  LUNGS: Normal breath sounds bilaterally, no wheezing, rales,rhonchi or crepitation. No use of accessory muscles of respiration.  CARDIOVASCULAR: S1, S2 normal. No murmurs, rubs, or  gallops.  ABDOMEN: Soft, nontender, nondistended. Bowel sounds present. No organomegaly or mass.  EXTREMITIES: No pedal edema, cyanosis, or clubbing. Some chronic changes on both feet on toes- hyperkeratotic but no redness. NEUROLOGIC: Cranial nerves II through XII are intact. Muscle strength 4/5 in all extremities. Sensation intact. Gait not checked.  PSYCHIATRIC: The patient is alert and oriented x 3.  SKIN: No obvious rash, lesion, or ulcer.   Physical Exam LABORATORY PANEL:   CBC Recent Labs  Lab 12/05/18 0316  WBC 9.0  HGB 10.0*  HCT 30.1*  PLT 141*   ------------------------------------------------------------------------------------------------------------------  Chemistries  Recent Labs  Lab 12/02/18 2353 12/04/18 0638 12/05/18 0316  NA 137 139 135  K 4.6 4.8 4.5  CL 108 110 105  CO2 20* 23 22  GLUCOSE 233* 137* 154*  BUN 19 18 18   CREATININE 1.44* 1.44* 1.40*  CALCIUM 8.9 8.3* 8.5*  MG 1.6* 2.1  --   AST 28  --   --   ALT 24  --   --   ALKPHOS 63  --   --   BILITOT 1.0  --   --    ------------------------------------------------------------------------------------------------------------------  Cardiac Enzymes Recent Labs  Lab 12/02/18 2353  TROPONINI <0.03   ------------------------------------------------------------------------------------------------------------------  RADIOLOGY:  No results found.  ASSESSMENT AND PLAN:   Active Problems:   Sepsis (HCC)  83M w/ PMHx T2IDDM, HTN, HLD, CAD/MI (s/p CABG x4 + MVR, 1999; s/p stents), Hx CHF (EF 55% as of 04/17/2017 Echo), Afib (Coumadin, goal INR 2.0-3.0 per note by Dr. Darrold JunkerParaschos, 10/29/2018) p/w rigors, N/V, fever, nocturia, sepsis, suspected L pyelonephritis. Hyperglycemia (w/ T2IDDM), Cr elevation/CKD III,  lactate elevation, mild leukocytosis, macrocytic anemia. Subtherapeutic INR.  * Sepsis- bacteremia- Pyelonephritis ruled out. lactate elevation, mild leukocytosis:   High-grade fever (T  104F) in ED, tachycardic, tachypneic; WBC high. (+) nocturia (urinating 4-5x/night) x3-4wks; (-) burning, pain, dysuria, frequency, urgency, hesitancy, dribbling, incontinence, pyuria, hematuria.   CT (+) "Heterogeneous enhancement of the left kidney mild perinephric edema,  U/A negartive. UCx, BCx sent. PCT < 0.10.   Started on broad-spectrum ABx in ED (Vancomycin + Cefepime + Flagyl)  IVF, symptomatic mgmt.  changed to rocephin as Bl cx have strep Agalacti  * Hyperglycemia, T2IDDM: SSI. 75/25 BID  * CKD III: Cr 1.44 on present admission, baseline 1.3-1.5. Baseline CKD III (2/2 DM, HTN, aged kidney). IVF. Monitor BMP, avoid nephrotoxins.  * Macrocytic anemia: B12, folate pending.  * Subtherapeutic INR: Coumadin, goal INR 2.0-3.0. INR 1.74 on present admission.  Pharmacy to help dosing.   All the records are reviewed and case discussed with Care Management/Social Workerr. Management plans discussed with the patient, family and they are in agreement.  CODE STATUS: Full  TOTAL TIME TAKING CARE OF THIS PATIENT: 35 minutes.    POSSIBLE D/C IN 1-2 DAYS, DEPENDING ON CLINICAL CONDITION.   Altamese Dilling M.D on 12/05/2018   Between 7am to 6pm - Pager - [phone]  After 6pm go to [email or web address] - password [email or web address]  [facility]  Office  [phone]  CC: Primary care physician; Danella Penton, MD  Note: This dictation was prepared with Dragon dictation along with smaller phrase technology. Any transcriptional errors that result from this process are unintentional.

## 2018-12-05 NOTE — Plan of Care (Signed)
Patient pulse ox has improved while on oxygen. Will wean in the morning.

## 2018-12-05 NOTE — Progress Notes (Signed)
Date of Admission:  12/02/2018      ID: Jeffrey Mills is a 83 y.o. male with Active Problems:   Sepsis (HCC) Group b streptococcus bacteremia Mitral valve anuuloplasty  Subjective: Feeling better No fever Has chronic left shoulder pain for months and it is not worse  Medications:  . aspirin EC  81 mg Oral Daily  . brimonidine  1 drop Both Eyes BID  . buPROPion  150 mg Oral Daily  . clopidogrel  75 mg Oral Daily  . escitalopram  5 mg Oral Daily  . ferrous gluconate  324 mg Oral Q breakfast  . gabapentin  100 mg Oral TID  . insulin aspart  0-15 Units Subcutaneous TID WC  . insulin aspart  0-5 Units Subcutaneous QHS  . insulin aspart protamine- aspart  25 Units Subcutaneous BID WC  . latanoprost  1 drop Both Eyes BID  . pantoprazole  40 mg Oral Daily  . sertraline  50 mg Oral Daily  . simvastatin  40 mg Oral Daily  . sodium chloride flush  10 mL Intravenous Q12H  . warfarin  7.5 mg Oral q1800    Objective: Vital signs in last 24 hours: Temp:  [98.1 F (36.7 C)-100.7 F (38.2 C)] 98.5 F (36.9 C) (01/08 1532) Pulse Rate:  [58-91] 64 (01/08 1532) Resp:  [16-20] 19 (01/08 1532) BP: (87-159)/(45-72) 123/48 (01/08 1532) SpO2:  [90 %-99 %] 99 % (01/08 1532) Weight:  [106.1 kg-108.4 kg] 106.1 kg (01/08 0929)  PHYSICAL EXAM:  General: Alert, cooperative, no distress, appears stated age.  Head: Normocephalic, without obvious abnormality, atraumatic. Eyes: Conjunctivae clear, anicteric sclerae. Pupils are equal ENT Nares normal. No drainage or sinus tenderness. Lips, mucosa, and tongue normal. No Thrush Neck: Supple, symmetrical, no adenopathy, thyroid: non tender no carotid bruit and no JVD. Back: No CVA tenderness. Lungs: Clear to auscultation bilaterally. No Wheezing or Rhonchi. No rales. Heart: Regular rate and rhythm, no murmur, rub or gallop. Abdomen: Soft, non-tender,not distended. Bowel sounds normal. No masses Extremities: no swelling of the left shoulder-  abduction Okay atraumatic, no cyanosis. No edema. No clubbing Skin: No rashes or lesions. Or bruising Lymph: Cervical, supraclavicular normal. Neurologic: Grossly non-focal  Lab Results Recent Labs    12/02/18 2353 12/04/18 0638 12/05/18 0316  WBC 10.9*  --  9.0  HGB 11.2*  --  10.0*  HCT 33.4*  --  30.1*  NA 137 139 135  K 4.6 4.8 4.5  CL 108 110 105  CO2 20* 23 22  BUN 19 18 18   CREATININE 1.44* 1.44* 1.40*   Liver Panel Recent Labs    12/02/18 2353  PROT 7.7  ALBUMIN 4.4  AST 28  ALT 24  ALKPHOS 63  BILITOT 1.0   Sedimentation Rate No results for input(s): ESRSEDRATE in the last 72 hours. C-Reactive Protein No results for input(s): CRP in the last 72 hours.  Microbiology:  Studies/Results: No results found.   Assessment/Plan: Group B streptococcus bacteremia- no source /site identified-  ?sudden onset illness on Sunday- No skin lesions , NO UTI Could it be a GI source?? He has mitral valve annuloplasty and not replacement as per my discussion with his daughter  TEE no  endocarditis On ceftriaxone-will need for 14 days total  CT findings of rt perinephric edema does not correlate with clinical findings of pyelonephritis or UTI  CAD/S/p CABG   DM-management as per primary team Will need PT to assess his function  Discussed the management with  patient and his daughter

## 2018-12-05 NOTE — Plan of Care (Signed)
Applied 2 liters of oxygen, pulse ox was 90% on room air.

## 2018-12-06 ENCOUNTER — Encounter: Payer: Self-pay | Admitting: Cardiology

## 2018-12-06 ENCOUNTER — Inpatient Hospital Stay: Payer: Self-pay

## 2018-12-06 ENCOUNTER — Inpatient Hospital Stay: Payer: Medicare Other

## 2018-12-06 DIAGNOSIS — Z95828 Presence of other vascular implants and grafts: Secondary | ICD-10-CM

## 2018-12-06 LAB — CULTURE, BLOOD (ROUTINE X 2)

## 2018-12-06 LAB — GLUCOSE, CAPILLARY
Glucose-Capillary: 156 mg/dL — ABNORMAL HIGH (ref 70–99)
Glucose-Capillary: 241 mg/dL — ABNORMAL HIGH (ref 70–99)
Glucose-Capillary: 173 mg/dL — ABNORMAL HIGH (ref 70–99)
Glucose-Capillary: 154 mg/dL — ABNORMAL HIGH (ref 70–99)

## 2018-12-06 LAB — BASIC METABOLIC PANEL
Anion gap: 5 (ref 5–15)
BUN: 25 mg/dL — ABNORMAL HIGH (ref 8–23)
CO2: 23 mmol/L (ref 22–32)
Calcium: 8.6 mg/dL — ABNORMAL LOW (ref 8.9–10.3)
Chloride: 106 mmol/L (ref 98–111)
Creatinine, Ser: 1.44 mg/dL — ABNORMAL HIGH (ref 0.61–1.24)
GFR calc non Af Amer: 46 mL/min — ABNORMAL LOW (ref >60)
GFR, EST AFRICAN AMERICAN: 53 mL/min — AB (ref >60)
Glucose, Bld: 215 mg/dL — ABNORMAL HIGH (ref 70–99)
Potassium: 4.9 mmol/L (ref 3.5–5.1)
Sodium: 134 mmol/L — ABNORMAL LOW (ref 135–145)

## 2018-12-06 LAB — PROTIME-INR
INR: 2.18
Prothrombin Time: 24 seconds — ABNORMAL HIGH (ref 11.4–15.2)

## 2018-12-06 MED ORDER — SODIUM CHLORIDE 0.9% FLUSH
10.0000 mL | Freq: Two times a day (BID) | INTRAVENOUS | Status: DC
  Administered 2018-12-06 – 2018-12-07 (×3): 10 mL

## 2018-12-06 MED ORDER — SODIUM CHLORIDE 0.9% FLUSH: 10.0000 mL | INTRAVENOUS | Status: DC | PRN

## 2018-12-06 NOTE — Progress Notes (Signed)
ANTICOAGULATION CONSULT NOTE - Initial Consult  Pharmacy Consult for warfarin Indication: atrial fibrillation  Allergies  Allergen Reactions  . Captopril   . Morphine And Related   . Penicillins     Patient Measurements: Height: 5\' 10"  (177.8 cm) Weight: 236 lb 11.2 oz (107.4 kg) IBW/kg (Calculated) : 73 Heparin Dosing Weight: 96 kg  Vital Signs: Temp: 99 F (37.2 C) (01/09 0545) Temp Source: Oral (01/09 0311) BP: 111/50 (01/09 0311) Pulse Rate: 79 (01/09 0311)  Labs: Recent Labs    12/04/18 6754 12/05/18 0316 12/06/18 0647  HGB  --  10.0*  --   HCT  --  30.1*  --   PLT  --  141*  --   LABPROT 21.8* 23.7* 24.0*  INR 1.93 2.15 2.18  CREATININE 1.44* 1.40* 1.44*    Estimated Creatinine Clearance: 50.2 mL/min (A) (by C-G formula based on SCr of 1.44 mg/dL (H)).   Medical History: Past Medical History:  Diagnosis Date  . Anemia   . Anxiety   . Arthritis   . Benign hypertension   . Cardiopulmonary arrest (HCC)   . CHF (congestive heart failure) (HCC)   . Coronary atherosclerosis of native coronary artery   . Depression   . Diabetes (HCC)   . Glaucoma   . Hyperlipidemia   . Mitral valve disorder   . Neuralgia   . PVD (peripheral vascular disease) (HCC)     Medications:  Scheduled:  . aspirin EC  81 mg Oral Daily  . brimonidine  1 drop Both Eyes BID  . buPROPion  150 mg Oral Daily  . clopidogrel  75 mg Oral Daily  . escitalopram  5 mg Oral Daily  . ferrous gluconate  324 mg Oral Q breakfast  . gabapentin  100 mg Oral TID  . insulin aspart  0-15 Units Subcutaneous TID WC  . insulin aspart  0-5 Units Subcutaneous QHS  . insulin aspart protamine- aspart  25 Units Subcutaneous BID WC  . latanoprost  1 drop Both Eyes BID  . pantoprazole  40 mg Oral Daily  . sertraline  50 mg Oral Daily  . simvastatin  40 mg Oral Daily  . sodium chloride flush  10 mL Intravenous Q12H  . warfarin  7.5 mg Oral q1800    Assessment: Patient arrived for chills and is  flu +. Patient also w/ h/o afib is anticoagulated w/ warfarin PTA. Warfarin home dose: 7.5 mg daily  01/06 INR 1.74 01/07 INR 1.93 01/08 INR 2.15 01/09 INR 2.18  Goal of Therapy:  INR 2-3 Monitor platelets by anticoagulation protocol: Yes   Plan:  INR currently therapeutic at 2.18. Will continue patient's regimen of warfarin 7.5 mg daily @ 1800 Will monitor daily CBC's and adjust per daily INRs.  Patient is on antibiotics which may cause an elevation in INR therefore upon discontinuation of antibiotics patient's warfarin requirement may increase.  Abbie Sons, PharmD Clinical Pharmacist 12/06/2018

## 2018-12-06 NOTE — Evaluation (Signed)
Physical Therapy Evaluation Patient Details Name: Jeffrey Mills MRN: [id number] DOB: 06/23/1936 Today's Date: 12/06/2018   History of Present Illness  Pt is a 83 y/o M who presented with weakness, fatigue, fever, rigors, shaking, vomiting.  Pt admitted with sepsis of unknown source.  Pt's PMH includes cardiopulmonary arrest, CHF, PVD, CABG.      Clinical Impression  Pt admitted with above diagnosis. Pt currently with functional limitations due to the deficits listed below (see PT Problem List).  PTA pt ind with ADLs and ambulating without AD.  Pt continues to demonstrate rigors/shakiness which was present at time of admission but pt says have improved. However, as a result pt demonstrates unsteadiness (no LOB) with transfers and ambulation.  Instructed pt to utilize a RW moving forward for safety (recommending RW to be provided prior to d/c).  Anticipate once pt' symptoms have cleared he will quickly return to baseline level of mobility. Pt will benefit from skilled PT to increase their independence and safety with mobility to allow discharge to the venue listed below.      Follow Up Recommendations Home health PT;Supervision for mobility/OOB    Equipment Recommendations  Rolling walker with 5" wheels    Recommendations for Other Services       Precautions / Restrictions Precautions Precautions: Fall Restrictions Weight Bearing Restrictions: No      Mobility  Bed Mobility Overal bed mobility: Modified Independent             General bed mobility comments: Pt performs without assist or cues but with slightly increased effort  Transfers Overall transfer level: Needs assistance Equipment used: Rolling walker (2 wheeled) Transfers: Sit to/from Stand Sit to Stand: Min guard         General transfer comment: Mild instability but no LOB   Ambulation/Gait Ambulation/Gait assistance: Min guard Gait Distance (Feet): 160 Feet Assistive device: None Gait Pattern/deviations:  Staggering left;Staggering right     General Gait Details: Pt with BUE and BLE tremors (see general comments below) affecting his stability.  Pt became unsteady x3 but was able to steady without outside physical assist.  When ambulating near railing he does reach out for support.  Pt unable to perform higher level balance activities while ambulating without becoming unsteady.   Stairs            Wheelchair Mobility    Modified Rankin (Stroke Patients Only)       Balance Overall balance assessment: Needs assistance Sitting-balance support: No upper extremity supported;Feet supported Sitting balance-Leahy Scale: Good     Standing balance support: No upper extremity supported;During functional activity Standing balance-Leahy Scale: Fair Standing balance comment: Pt demonstrates unsteadiness with static and dynamic activities             High level balance activites: Turns;Sudden stops;Head turns High Level Balance Comments: Pt demonstrates unsteadiness (no LOB) with higher level balance testing/activities             Pertinent Vitals/Pain Pain Assessment: No/denies pain    Home Living Family/patient expects to be discharged to:: Private residence Living Arrangements: Alone Available Help at Discharge: Family;Available 24 hours/day(daughter to stay with pt at d/c) Type of Home: House Home Access: Stairs to enter Entrance Stairs-Rails: Right;Left;Can reach both Entrance Stairs-Number of Steps: 5 Home Layout: One level Home Equipment: Cane - single point(pt unsure if he still have RW and BSC from 14 years ago)      Prior Function Level of Independence: Independent  Comments: Pt ind with ADLs.  Pt still driving.  Ambulating without AD and no recent falls.       Hand Dominance        Extremity/Trunk Assessment   Upper Extremity Assessment Upper Extremity Assessment: (BUE strength grossly 4/5)    Lower Extremity Assessment Lower Extremity  Assessment: (BLE strength grossly 4/5)       Communication   Communication: No difficulties  Cognition Arousal/Alertness: Awake/alert Behavior During Therapy: WFL for tasks assessed/performed Overall Cognitive Status: Within Functional Limits for tasks assessed                                        General Comments General comments (skin integrity, edema, etc.): Pt with generalized shakiness/tremors BUEs and BLEs which pt reports began with his sickness, is not his basline.  Says this has improved since treatment was started in hospital.     Exercises     Assessment/Plan    PT Assessment Patient needs continued PT services  PT Problem List Decreased strength;Decreased balance;Decreased knowledge of use of DME;Decreased safety awareness       PT Treatment Interventions DME instruction;Gait training;Stair training;Functional mobility training;Therapeutic activities;Therapeutic exercise;Balance training;Neuromuscular re-education;Patient/family education    PT Goals (Current goals can be found in the Care Plan section)  Acute Rehab PT Goals Patient Stated Goal: to go home and get some rest PT Goal Formulation: With patient Time For Goal Achievement: 12/20/18 Potential to Achieve Goals: Good    Frequency Min 2X/week   Barriers to discharge        Co-evaluation               AM-PAC PT "6 Clicks" Mobility  Outcome Measure Help needed turning from your back to your side while in a flat bed without using bedrails?: A Little Help needed moving from lying on your back to sitting on the side of a flat bed without using bedrails?: A Little Help needed moving to and from a bed to a chair (including a wheelchair)?: A Little Help needed standing up from a chair using your arms (e.g., wheelchair or bedside chair)?: A Little Help needed to walk in hospital room?: A Little Help needed climbing 3-5 steps with a railing? : A Little 6 Click Score: 18    End of  Session Equipment Utilized During Treatment: Gait belt Activity Tolerance: Patient tolerated treatment well Patient left: in chair;with call bell/phone within reach;with chair alarm set Nurse Communication: Mobility status;Other (comment)(BUE and BLE tremors, RN already aware) PT Visit Diagnosis: Unsteadiness on feet (R26.81);Other abnormalities of gait and mobility (R26.89);Muscle weakness (generalized) (M62.81)    Time: 6269-4854 PT Time Calculation (min) (ACUTE ONLY): 20 min   Charges:   PT Evaluation $PT Eval Low Complexity: 1 Low PT Treatments $Gait Training: 8-22 mins        Encarnacion Chu PT, DPT 12/06/2018, 12:07 PM

## 2018-12-06 NOTE — Care Management Note (Signed)
Case Management Note  Patient Details  Name: Jeffrey Mills MRN: [id number] Date of Birth: 05/25/1936  Subjective/Objective:  Patient is from home alone.  His daughter and son come by often;  They live very close and are supportive.  He will be discharging home with IV ceftriaxone.  His PICC line is in place.  Referral to [facility] for home health services, IV antibiotics and DME-rolling walker.   He denies difficulties obtaining medications or with accessing medical care.  He does not have difficulties affording medications.  Will continue to assist with discharge planning.                   Action/Plan:   Expected Discharge Date:                  Expected Discharge Plan:  Home w Home Health Services  In-House Referral:     Discharge planning Services  CM Consult  Post Acute Care Choice:  Home Health, Durable Medical Equipment Choice offered to:  Patient  DME Arranged:  Walker rolling DME Agency:  [facility]  HH Arranged:  RN, PT, IV Antibiotics HH Agency:  [facility]  Status of Service:  Completed, signed off  If discussed at Long Length of Stay Meetings, dates discussed:    Additional Comments:  Sherren Kerns, RN 12/06/2018, 1:56 PM

## 2018-12-06 NOTE — Progress Notes (Signed)
[employer] - [place] at [facility]   PATIENT NAME: Jeffrey MassedLarry Mills    MR#:  [id number]  DATE OF BIRTH:  11/26/1936  SUBJECTIVE:  CHIEF COMPLAINT:   Chief Complaint  Patient presents with  . Fever   Came with fever and generalized weakness with shaking and chills. No clear source of infection yet, had some vomiting episodes at home. No diarrhea or abdominal pain. He have bacteremia on first set of cultures. No fever. Much improved. REVIEW OF SYSTEMS:  CONSTITUTIONAL: no fever, fatigue or weakness.  EYES: No blurred or double vision.  EARS, NOSE, AND THROAT: No tinnitus or ear pain.  RESPIRATORY: No cough, shortness of breath, wheezing or hemoptysis.  CARDIOVASCULAR: No chest pain, orthopnea, edema.  GASTROINTESTINAL: Have nausea, vomiting, no diarrhea or abdominal pain.  GENITOURINARY: No dysuria, hematuria.  ENDOCRINE: No polyuria, nocturia,  HEMATOLOGY: No anemia, easy bruising or bleeding SKIN: No rash or lesion. MUSCULOSKELETAL: No joint pain or arthritis.   NEUROLOGIC: No tingling, numbness, weakness.  PSYCHIATRY: No anxiety or depression.   ROS  DRUG ALLERGIES:   Allergies  Allergen Reactions  . Captopril   . Morphine And Related   . Penicillins     VITALS:  Blood pressure (!) 111/50, pulse 79, temperature 99 F (37.2 C), resp. rate 20, height 5\' 10"  (1.778 m), weight 107.4 kg, SpO2 97 %.  PHYSICAL EXAMINATION:  GENERAL:  83 y.o.-year-old patient lying in the bed with no acute distress.  EYES: Pupils equal, round, reactive to light and accommodation. No scleral icterus. Extraocular muscles intact.  HEENT: Head atraumatic, normocephalic. Oropharynx and nasopharynx clear.  NECK:  Supple, no jugular venous distention. No thyroid enlargement, no tenderness.  LUNGS: Normal breath sounds bilaterally, no wheezing, rales,rhonchi or crepitation. No use of accessory muscles of respiration.  CARDIOVASCULAR: S1, S2 normal. No murmurs, rubs, or gallops.   ABDOMEN: Soft, nontender, nondistended. Bowel sounds present. No organomegaly or mass.  EXTREMITIES: No pedal edema, cyanosis, or clubbing. Some chronic changes on both feet on toes- hyperkeratotic but no redness. NEUROLOGIC: Cranial nerves II through XII are intact. Muscle strength 4/5 in all extremities. Sensation intact. Gait not checked.  PSYCHIATRIC: The patient is alert and oriented x 3.  SKIN: No obvious rash, lesion, or ulcer.   Physical Exam LABORATORY PANEL:   CBC Recent Labs  Lab 12/05/18 0316  WBC 9.0  HGB 10.0*  HCT 30.1*  PLT 141*   ------------------------------------------------------------------------------------------------------------------  Chemistries  Recent Labs  Lab 12/02/18 2353 12/04/18 0638  12/06/18 0647  NA 137 139   < > 134*  K 4.6 4.8   < > 4.9  CL 108 110   < > 106  CO2 20* 23   < > 23  GLUCOSE 233* 137*   < > 215*  BUN 19 18   < > 25*  CREATININE 1.44* 1.44*   < > 1.44*  CALCIUM 8.9 8.3*   < > 8.6*  MG 1.6* 2.1  --   --   AST 28  --   --   --   ALT 24  --   --   --   ALKPHOS 63  --   --   --   BILITOT 1.0  --   --   --    < > = values in this interval not displayed.   ------------------------------------------------------------------------------------------------------------------  Cardiac Enzymes Recent Labs  Lab 12/02/18 2353  TROPONINI <0.03   ------------------------------------------------------------------------------------------------------------------  RADIOLOGY:  [place] Ekg Site Rite  Result  Date: 12/06/2018 If Site Rite image not attached, placement could not be confirmed due to current cardiac rhythm.   ASSESSMENT AND PLAN:   Active Problems:   Sepsis (HCC)  [name] w/ PMHx T2IDDM, HTN, HLD, CAD/MI (s/p CABG x4 + MVR, 1999; s/p stents), Hx CHF (EF 55% as of 04/17/2017 Echo), Afib (Coumadin, goal INR 2.0-3.0 per note by Dr. Darrold Junker, 10/29/2018) p/w rigors, N/V, fever, nocturia, sepsis, suspected L pyelonephritis.  Hyperglycemia (w/ T2IDDM), Cr elevation/CKD III, lactate elevation, mild leukocytosis, macrocytic anemia. Subtherapeutic INR.  * Sepsis- bacteremia- Pyelonephritis ruled out. lactate elevation, mild leukocytosis:   High-grade fever (T 104F) in ED, tachycardic, tachypneic; WBC high. (+) nocturia (urinating 4-5x/night) x3-4wks; (-) burning, pain, dysuria, frequency, urgency, hesitancy, dribbling, incontinence, pyuria, hematuria.   CT (+) "Heterogeneous enhancement of the left kidney mild perinephric edema,  U/A negartive. UCx, BCx sent. PCT < 0.10.   Started on broad-spectrum ABx in ED (Vancomycin + Cefepime + Flagyl)  IVF, symptomatic mgmt.  changed to rocephine as Bl cx have strep Agalactiae  TEE done, negative for vegetations.  ID consult to help decide source and Guide Abx therapy.  * Hyperglycemia, T2IDDM: SSI. 75/25 BID  * CKD III: Cr 1.44 on present admission, baseline 1.3-1.5. Baseline CKD III (2/2 DM, HTN, aged kidney). IVF. Monitor BMP, avoid nephrotoxins.  * Macrocytic anemia: B12, folate pending.  * Subtherapeutic INR: Coumadin, goal INR 2.0-3.0. INR 1.74 on present admission.  Pharmacy to help dosing.   All the records are reviewed and case discussed with Care Management/Social Workerr. Management plans discussed with the patient, family and they are in agreement.  CODE STATUS: Full  TOTAL TIME TAKING CARE OF THIS PATIENT: 35 minutes.  Spoke to his daughter on phone in detail. She had bacteremia and was on PICC , Abx for 6 weeks, so she knows very well how to get Abx. And Willing to help him- when d/c - if he need IV Abx.  POSSIBLE D/C IN 1-2 DAYS, DEPENDING ON CLINICAL CONDITION.   Altamese Dilling M.D on 12/06/2018   Between 7am to 6pm - Pager - [phone]  After 6pm go to [email or web address] - password [email or web address]  [facility] [place] [facility]  Office  [phone]  CC: Primary care physician; Danella Penton, MD  Note: This dictation was prepared with Dragon  dictation along with smaller phrase technology. Any transcriptional errors that result from this process are unintentional.

## 2018-12-06 NOTE — Treatment Plan (Signed)
Diagnosis: Group B streptococcus bacteremia Baseline Creatinine 1.44    Allergies  Allergen Reactions  . Captopril   . Morphine And Related   . Penicillins     OPAT Orders Ceftriaxone 2 grams IV every 24hours  End Date:12/18/18   PIC Care Per Protocol:  Labs weekly while on IV antibiotics: __X CBC with differential  _X_ CMP X__ CRP _X__ Please pull PIC at completion of IV antibiotics _  Fax weekly labs to [phone]  Clinic Follow Up Appt 3 weeks   Call [phone] to make appt

## 2018-12-06 NOTE — Progress Notes (Signed)
Peripherally Inserted Central Catheter/Midline Placement  The IV Nurse has discussed with the patient and/or persons authorized to consent for the patient, the purpose of this procedure and the potential benefits and risks involved with this procedure.  The benefits include less needle sticks, lab draws from the catheter, and the patient may be discharged home with the catheter. Risks include, but not limited to, infection, bleeding, blood clot (thrombus formation), and puncture of an artery; nerve damage and irregular heartbeat and possibility to perform a PICC exchange if needed/ordered by physician.  Alternatives to this procedure were also discussed.  Bard Power PICC patient education guide, fact sheet on infection prevention and patient information card has been provided to patient /or left at bedside.    PICC/Midline Placement Documentation  PICC Single Lumen 12/06/18 PICC Right Basilic 44 cm 0 cm (Active)  Indication for Insertion or Continuance of Line Home intravenous therapies (PICC only) 12/06/2018 11:07 AM  Exposed Catheter (cm) 0 cm 12/06/2018 11:07 AM  Site Assessment Clean;Dry;Intact 12/06/2018 11:07 AM  Line Status Flushed;Blood return noted 12/06/2018 11:07 AM  Dressing Type Transparent 12/06/2018 11:07 AM  Dressing Status Clean;Dry;Intact;Antimicrobial disc in place 12/06/2018 11:07 AM  Dressing Intervention New dressing 12/06/2018 11:07 AM  Dressing Change Due 12/13/18 12/06/2018 11:07 AM       Reginia Forts Albarece 12/06/2018, 11:08 AM

## 2018-12-06 NOTE — Consult Note (Signed)
PHARMACY CONSULT NOTE - FOLLOW UP  Pharmacy Consult for Electrolyte Monitoring and Replacement   Recent Labs: Potassium (mmol/L)  Date Value  12/06/2018 4.9   Magnesium (mg/dL)  Date Value  37/08/6268 2.1   Calcium (mg/dL)  Date Value  48/54/6270 8.6 (L)   Albumin (g/dL)  Date Value  35/00/9381 4.4   Phosphorus (mg/dL)  Date Value  82/99/3716 3.6   Sodium (mmol/L)  Date Value  12/06/2018 134 (L)     Assessment: 83 y.o. male with a known history of T2IDDM, HTN, HLD, CAD/MI, Hx CHF, Afib p/w rigors, fever, sepsis. Pt is AAOx3, but states he feels very weak and tired.  Goal of Therapy:  K ~4.0 Mg ~2.0 Phos 2.5-4.6  Plan:  Electrolytes WNL. Pharmacy will signoff from electrolyte management at this time.  Thank you for allowing Pharmacy to take part in the care of this patient.  Please let us know if further assistance is needed.  Orinda Kenner, PharmD Clinical Pharmacist 12/06/2018 7:51 AM

## 2018-12-06 NOTE — Care Management Important Message (Signed)
Copy of signed Medicare IM left with patient in room. 

## 2018-12-06 NOTE — Progress Notes (Signed)
Date of Admission:  12/02/2018     ID: Jeffrey Mills is a 83 y.o. male  Active Problems:   Sepsis (HCC) GBS bacteremia   Subjective: Doing better Did not feel hot ( had a temp of 100.6) Ate well No diarrhea Walked with PT  Medications:  . aspirin EC  81 mg Oral Daily  . brimonidine  1 drop Both Eyes BID  . buPROPion  150 mg Oral Daily  . clopidogrel  75 mg Oral Daily  . escitalopram  5 mg Oral Daily  . ferrous gluconate  324 mg Oral Q breakfast  . gabapentin  100 mg Oral TID  . insulin aspart  0-15 Units Subcutaneous TID WC  . insulin aspart  0-5 Units Subcutaneous QHS  . insulin aspart protamine- aspart  25 Units Subcutaneous BID WC  . latanoprost  1 drop Both Eyes BID  . pantoprazole  40 mg Oral Daily  . sertraline  50 mg Oral Daily  . simvastatin  40 mg Oral Daily  . sodium chloride flush  10 mL Intravenous Q12H  . sodium chloride flush  10-40 mL Intracatheter Q12H  . warfarin  7.5 mg Oral q1800    Objective: Vital signs in last 24 hours: Temp:  [99 F (37.2 C)-100.7 F (38.2 C)] 99.5 F (37.5 C) (01/09 1941) Pulse Rate:  [69-79] 79 (01/09 1941) Resp:  [18-20] 18 (01/09 1705) BP: (111-153)/(50-75) 139/64 (01/09 1941) SpO2:  [91 %-97 %] 92 % (01/09 1941) Weight:  [107.4 kg] 107.4 kg (01/09 0311)  PHYSICAL EXAM:  General: Alert, cooperative, no distress, appears stated age.  Head: Normocephalic, without obvious abnormality, atraumatic. Eyes: Conjunctivae clear, anicteric sclerae. Pupils are equal ENT Nares normal. No drainage or sinus tenderness. Lips, mucosa, and tongue normal. No Thrush Neck: Supple, symmetrical, no adenopathy, thyroid: non tender no carotid bruit and no JVD. Back: No CVA tenderness. Lungs: Clear to auscultation bilaterally. No Wheezing or Rhonchi. No rales. Heart: Regular rate and rhythm, no murmur, rub or gallop. Abdomen: Soft, non-tender,not distended. Bowel sounds normal. No masses Extremities: rt PICC atraumatic, no cyanosis.  No edema. No clubbing Skin: No rashes or lesions. Or bruising Lymph: Cervical, supraclavicular normal. Neurologic: Grossly non-focal  Lab Results Recent Labs    12/05/18 0316 12/06/18 0647  WBC 9.0  --   HGB 10.0*  --   HCT 30.1*  --   NA 135 134*  K 4.5 4.9  CL 105 106  CO2 22 23  BUN 18 25*  CREATININE 1.40* 1.44*   Liver Panel No results for input(s): PROT, ALBUMIN, AST, ALT, ALKPHOS, BILITOT, BILIDIR, IBILI in the last 72 hours. Sedimentation Rate No results for input(s): ESRSEDRATE in the last 72 hours. C-Reactive Protein No results for input(s): CRP in the last 72 hours.  Microbiology:  Studies/Results: Dg Chest 2 View  Result Date: 12/06/2018 CLINICAL DATA:  Fever. EXAM: CHEST - 2 VIEW COMPARISON:  Radiograph of December 03, 2018. FINDINGS: Stable cardiomediastinal silhouette. Status post coronary bypass graft. Interval placement of right-sided PICC line with distal tip in expected position of the SVC. No pneumothorax or pleural effusion is noted. Atherosclerosis of thoracic aorta is noted. Both lungs are clear. The visualized skeletal structures are unremarkable. IMPRESSION: No active cardiopulmonary disease. Interval placement of right-sided PICC line. Aortic Atherosclerosis (ICD10-I70.0). Electronically Signed   By: Lupita Raider, M.D.   On: 12/06/2018 12:26   [place] Ekg Site Rite  Result Date: 12/06/2018 If Site Rite image not attached, placement could  not be confirmed due to current cardiac rhythm.    Assessment/Plan:  Group B streptococcus bacteremia- no source /site identified- ?sudden onset illness on Sunday- No skin lesions, NO UTI Could it be a GI source?? He has mitral valve annuloplasty and not replacement as per my discussion with his daughter TEE no  endocarditis On ceftriaxone-will need for 14 days total  CT findings of rt perinephric edema does not correlate with clinical findings of pyelonephritis or UTI  CAD/S/p CABG   DM-management as  per primary team   Discussed the management with patient

## 2018-12-07 LAB — BASIC METABOLIC PANEL
Anion gap: 9 (ref 5–15)
BUN: 24 mg/dL — ABNORMAL HIGH (ref 8–23)
CALCIUM: 8.7 mg/dL — AB (ref 8.9–10.3)
CO2: 21 mmol/L — ABNORMAL LOW (ref 22–32)
Chloride: 102 mmol/L (ref 98–111)
Creatinine, Ser: 1.29 mg/dL — ABNORMAL HIGH (ref 0.61–1.24)
GFR calc Af Amer: >60 mL/min (ref >60)
GFR calc non Af Amer: 52 mL/min — ABNORMAL LOW (ref >60)
Glucose, Bld: 236 mg/dL — ABNORMAL HIGH (ref 70–99)
Potassium: 4.3 mmol/L (ref 3.5–5.1)
Sodium: 132 mmol/L — ABNORMAL LOW (ref 135–145)

## 2018-12-07 LAB — CBC
HCT: 30.2 % — ABNORMAL LOW (ref 39.0–52.0)
Hemoglobin: 10.2 g/dL — ABNORMAL LOW (ref 13.0–17.0)
MCH: 34.7 pg — ABNORMAL HIGH (ref 26.0–34.0)
MCHC: 33.8 g/dL (ref 30.0–36.0)
MCV: 102.7 fL — ABNORMAL HIGH (ref 80.0–100.0)
PLATELETS: 146 10*3/uL — AB (ref 150–400)
RBC: 2.94 MIL/uL — AB (ref 4.22–5.81)
RDW: 14.4 % (ref 11.5–15.5)
WBC: 6.3 10*3/uL (ref 4.0–10.5)
nRBC: 0 % (ref 0.0–0.2)

## 2018-12-07 LAB — GLUCOSE, CAPILLARY
Glucose-Capillary: 143 mg/dL — ABNORMAL HIGH (ref 70–99)
Glucose-Capillary: 263 mg/dL — ABNORMAL HIGH (ref 70–99)

## 2018-12-07 LAB — PROTIME-INR
INR: 1.76
Prothrombin Time: 20.3 seconds — ABNORMAL HIGH (ref 11.4–15.2)

## 2018-12-07 LAB — PROCALCITONIN: Procalcitonin: 0.14 ng/mL

## 2018-12-07 MED ORDER — WARFARIN SODIUM 10 MG PO TABS
10.0000 mg | ORAL_TABLET | Freq: Once | ORAL | Status: DC
  Filled 2018-12-07: qty 1

## 2018-12-07 MED ORDER — WARFARIN - PHARMACIST DOSING INPATIENT: Freq: Every day | Status: DC

## 2018-12-07 MED ORDER — CEFTRIAXONE IV (FOR PTA / DISCHARGE USE ONLY): 2.0000 g | INTRAVENOUS | 0 refills | Status: AC

## 2018-12-07 NOTE — Progress Notes (Signed)
[employer] - [place] at [facility]   PATIENT NAME: Jeffrey Mills    MR#:  [id number]  DATE OF BIRTH:  08/23/1936  SUBJECTIVE:  CHIEF COMPLAINT:   Chief Complaint  Patient presents with  . Fever   Came with fever and generalized weakness with shaking and chills. No clear source of infection yet, had some vomiting episodes at home. No diarrhea or abdominal pain. He have bacteremia on first set of cultures. No fever. Much improved. Repeat cx negative, Picc line to place today. REVIEW OF SYSTEMS:  CONSTITUTIONAL: no fever, fatigue or weakness.  EYES: No blurred or double vision.  EARS, NOSE, AND THROAT: No tinnitus or ear pain.  RESPIRATORY: No cough, shortness of breath, wheezing or hemoptysis.  CARDIOVASCULAR: No chest pain, orthopnea, edema.  GASTROINTESTINAL: Have nausea, vomiting, no diarrhea or abdominal pain.  GENITOURINARY: No dysuria, hematuria.  ENDOCRINE: No polyuria, nocturia,  HEMATOLOGY: No anemia, easy bruising or bleeding SKIN: No rash or lesion. MUSCULOSKELETAL: No joint pain or arthritis.   NEUROLOGIC: No tingling, numbness, weakness.  PSYCHIATRY: No anxiety or depression.   ROS  DRUG ALLERGIES:   Allergies  Allergen Reactions  . Captopril   . Morphine And Related   . Penicillins     VITALS:  Blood pressure (!) 153/79, pulse 71, temperature 98.2 F (36.8 C), temperature source Oral, resp. rate 18, height 5\' 10"  (1.778 m), weight 107.4 kg, SpO2 95 %.  PHYSICAL EXAMINATION:  GENERAL:  83 y.o.-year-old patient lying in the bed with no acute distress.  EYES: Pupils equal, round, reactive to light and accommodation. No scleral icterus. Extraocular muscles intact.  HEENT: Head atraumatic, normocephalic. Oropharynx and nasopharynx clear.  NECK:  Supple, no jugular venous distention. No thyroid enlargement, no tenderness.  LUNGS: Normal breath sounds bilaterally, no wheezing, rales,rhonchi or crepitation. No use of accessory muscles of  respiration.  CARDIOVASCULAR: S1, S2 normal. No murmurs, rubs, or gallops.  ABDOMEN: Soft, nontender, nondistended. Bowel sounds present. No organomegaly or mass.  EXTREMITIES: No pedal edema, cyanosis, or clubbing. Some chronic changes on both feet on toes- hyperkeratotic but no redness. NEUROLOGIC: Cranial nerves II through XII are intact. Muscle strength 4/5 in all extremities. Sensation intact. Gait not checked.  PSYCHIATRIC: The patient is alert and oriented x 3.  SKIN: No obvious rash, lesion, or ulcer.   Physical Exam LABORATORY PANEL:   CBC Recent Labs  Lab 12/05/18 0316  WBC 9.0  HGB 10.0*  HCT 30.1*  PLT 141*   ------------------------------------------------------------------------------------------------------------------  Chemistries  Recent Labs  Lab 12/02/18 2353 12/04/18 0638  12/06/18 0647  NA 137 139   < > 134*  K 4.6 4.8   < > 4.9  CL 108 110   < > 106  CO2 20* 23   < > 23  GLUCOSE 233* 137*   < > 215*  BUN 19 18   < > 25*  CREATININE 1.44* 1.44*   < > 1.44*  CALCIUM 8.9 8.3*   < > 8.6*  MG 1.6* 2.1  --   --   AST 28  --   --   --   ALT 24  --   --   --   ALKPHOS 63  --   --   --   BILITOT 1.0  --   --   --    < > = values in this interval not displayed.   ------------------------------------------------------------------------------------------------------------------  Cardiac Enzymes Recent Labs  Lab 12/02/18 2353  TROPONINI <0.03   ------------------------------------------------------------------------------------------------------------------  RADIOLOGY:  Dg Chest 2 View  Result Date: 12/06/2018 CLINICAL DATA:  Fever. EXAM: CHEST - 2 VIEW COMPARISON:  Radiograph of December 03, 2018. FINDINGS: Stable cardiomediastinal silhouette. Status post coronary bypass graft. Interval placement of right-sided PICC line with distal tip in expected position of the SVC. No pneumothorax or pleural effusion is noted. Atherosclerosis of thoracic aorta is  noted. Both lungs are clear. The visualized skeletal structures are unremarkable. IMPRESSION: No active cardiopulmonary disease. Interval placement of right-sided PICC line. Aortic Atherosclerosis (ICD10-I70.0). Electronically Signed   By: Lupita RaiderJames  Green Jr, M.D.   On: 12/06/2018 12:26   [place] Ekg Site Rite  Result Date: 12/06/2018 If Site Rite image not attached, placement could not be confirmed due to current cardiac rhythm.   ASSESSMENT AND PLAN:   Active Problems:   Sepsis (HCC)  44M w/ PMHx T2IDDM, HTN, HLD, CAD/MI (s/p CABG x4 + MVR, 1999; s/p stents), Hx CHF (EF 55% as of 04/17/2017 Echo), Afib (Coumadin, goal INR 2.0-3.0 per note by Dr. Darrold JunkerParaschos, 10/29/2018) p/w rigors, N/V, fever, nocturia, sepsis, suspected L pyelonephritis. Hyperglycemia (w/ T2IDDM), Cr elevation/CKD III, lactate elevation, mild leukocytosis, macrocytic anemia. Subtherapeutic INR.  * Sepsis- bacteremia- Pyelonephritis ruled out. No clear source for bacteremia. lactate elevation, mild leukocytosis:   High-grade fever (T 104F) in ED, tachycardic, tachypneic; WBC high.  CT (+) "Heterogeneous enhancement of the left kidney mild perinephric edema,  U/A negartive. UCx, BCx sent. PCT < 0.10.   Started on broad-spectrum ABx in ED (Vancomycin + Cefepime + Flagyl)  IVF  changed to rocephine as Bl cx have strep Agalactiae  TEE done, negative for vegetations.  ID consult to help decide source and Guide Abx therapy.  Suggest 2 weeks IV , get PICC line. Had low grade fever today, monitor.  * Hyperglycemia, T2IDDM: SSI. 75/25 BID  * CKD III: Cr 1.44 on present admission, baseline 1.3-1.5. Baseline CKD III (2/2 DM, HTN, aged kidney). IVF. Monitor BMP, avoid nephrotoxins.  * Macrocytic anemia: B12, folate pending.  * Subtherapeutic INR: Coumadin, goal INR 2.0-3.0. INR 1.74 on present admission.  Pharmacy to help dosing.  Get PT eval for weakness.  All the records are reviewed and case discussed with Care Management/Social  Workerr. Management plans discussed with the patient, family and they are in agreement.  CODE STATUS: Full  TOTAL TIME TAKING CARE OF THIS PATIENT: 35 minutes.  Spoke to his daughter on phone in detail. She had bacteremia and was on PICC , Abx for 6 weeks, so she knows very well how to get Abx. And Willing to help him- when d/c - if he need IV Abx.  POSSIBLE D/C IN 1-2 DAYS, DEPENDING ON CLINICAL CONDITION.   Altamese DillingVaibhavkumar Xayden Linsey M.D on 12/07/2018   Between 7am to 6pm - Pager - [phone]  After 6pm go to [email or web address] - password [email or web address]  [facility] [place] [facility]  Office  [phone]  CC: Primary care physician; Danella PentonMiller, Mark F, MD  Note: This dictation was prepared with Dragon dictation along with smaller phrase technology. Any transcriptional errors that result from this process are unintentional.

## 2018-12-07 NOTE — Progress Notes (Signed)
[name] to be D/C'd Home with home health per MD order.  Discussed prescriptions and follow up appointments with the patient. Prescriptions given to patient, medication list explained in detail. Pt verbalized understanding.  Allergies as of 12/07/2018      Reactions   Captopril    Morphine And Related    Penicillins       Medication List    STOP taking these medications   apixaban 2.5 MG Tabs tablet Commonly known as:  ELIQUIS   clindamycin 150 MG capsule Commonly known as:  CLEOCIN     TAKE these medications   acetaminophen 325 MG tablet Commonly known as:  TYLENOL Take 650 mg by mouth every 6 (six) hours as needed.   ALPRAZolam 0.5 MG tablet Commonly known as:  XANAX Take 0.5 mg by mouth at bedtime as needed for anxiety.   aspirin 81 MG tablet Take 81 mg by mouth daily.   brimonidine 0.2 % ophthalmic solution Commonly known as:  ALPHAGAN Place 1 drop into both eyes 2 (two) times daily.   cefTRIAXone  IVPB Commonly known as:  ROCEPHIN Inject 2 g into the vein daily for 10 days. Indication:  Group B strep bacteremia Last Day of Therapy:  12/18/2018 Labs weekly while on IV antibiotics: CBC with differential, CMP, CRP Start taking on:  December 08, 2018   clopidogrel 75 MG tablet Commonly known as:  PLAVIX Take 75 mg by mouth daily.   escitalopram 5 MG tablet Commonly known as:  LEXAPRO Take 5 mg by mouth daily.   ferrous gluconate 324 MG tablet Commonly known as:  FERGON Take 324 mg by mouth daily with breakfast.   gabapentin 100 MG capsule Commonly known as:  NEURONTIN Take 100 mg by mouth 3 (three) times daily.   hyoscyamine 0.125 MG tablet Commonly known as:  LEVSIN, ANASPAZ Take 0.125 mg by mouth 3 (three) times daily as needed.   insulin lispro protamine-lispro (75-25) 100 UNIT/ML Susp injection Commonly known as:  HUMALOG 75/25 MIX Inject 38 Units into the skin 2 (two) times daily with a meal.   INSULIN SYRINGE .3CC/31GX5/16" 31G X 5/16"  0.3 ML Misc by Does not apply route.   lansoprazole 30 MG capsule Commonly known as:  PREVACID Take 30 mg by mouth daily at 12 noon.   latanoprost 0.005 % ophthalmic solution Commonly known as:  XALATAN Place 1 drop into both eyes 2 (two) times daily.   meclizine 25 MG tablet Commonly known as:  ANTIVERT Take 25 mg by mouth 3 (three) times daily as needed for dizziness.   metFORMIN 500 MG tablet Commonly known as:  GLUCOPHAGE Take 1,000 mg by mouth 2 (two) times daily with a meal.   metoprolol succinate 25 MG 24 hr tablet Commonly known as:  TOPROL-XL Take 25 mg by mouth daily.   NOVOLIN R 100 units/mL injection Generic drug:  insulin regular Inject into the skin 3 (three) times daily before meals. Sliding Scale dose max of 10 units daily   pantoprazole 40 MG tablet Commonly known as:  PROTONIX Take 40 mg by mouth daily.   sertraline 50 MG tablet Commonly known as:  ZOLOFT Take 50 mg by mouth daily.   simvastatin 40 MG tablet Commonly known as:  ZOCOR Take 40 mg by mouth daily.   triamcinolone cream 0.1 % Commonly known as:  KENALOG Apply 1 application topically 2 (two) times daily.   VICTOZA [place] Inject 1.8 mg into the skin daily.   vitamin B-12  500 MCG tablet Commonly known as:  CYANOCOBALAMIN Take 500 mcg by mouth daily.   warfarin 5 MG tablet Commonly known as:  COUMADIN Take 7.5 mg by mouth daily.   WELLBUTRIN XL 150 MG 24 hr tablet Generic drug:  buPROPion Take 150 mg by mouth daily.            Home Infusion Instuctions  (From admission, onward)         Start     Ordered   12/07/18 0000  Home infusion instructions [facility]; May administer Cathflo as needed to maintain patency of vascular access device.; [facility]: 0.9% NaCl pre/post medica...    Question Answer Comment  Instructions [facility]   Instructions May  administer Cathflo as needed to maintain patency of vascular access device.   Instructions Flushing of vascular access device: per [facility] Protocol: 0.9% NaCl pre/post medication administration and prn patency; Heparin 100 u/ml, 5ml for implanted ports and Heparin 10u/ml, 5ml for all other central venous catheters.   Instructions May follow [facility] Anaphylaxis Protocol for First Dose Administration in the home: 0.9% NaCl at 25-50 ml/hr to maintain IV access for protocol meds. Epinephrine 0.3 ml IV/IM PRN and Benadryl 25-50 IV/IM PRN s/s of anaphylaxis.   Instructions [facility] Infusion Coordinator (RN) to assist per patient IV care needs in the home PRN.      12/07/18 1248          Vitals:   12/07/18 0852 12/07/18 1211  BP: (!) 165/87   Pulse: 79   Resp: 20   Temp: 98.7 F (37.1 C)   SpO2: 93% 93%    Tele box removed and returned. Skin clean, dry and intact without evidence of skin break down, no evidence of skin tears noted. Pt d/c with single lumen PICC line for long term IV ABX . Site without signs and symptoms of complications.. Pt denies pain at this time. No complaints noted.  An After Visit Summary was printed and given to the patient. Patient escorted via WC, and D/C home via private auto. Lucila MaineGrandson will transport pt home.  Rigoberto NoelErica Y Saroya Riccobono

## 2018-12-07 NOTE — Progress Notes (Signed)
SATURATION QUALIFICATIONS: (This note is used to comply with regulatory documentation for home oxygen)  Patient Saturations on Room Air at Rest = 95%  Patient Saturations on Room Air while Ambulating = 93%  Patient Saturations on na Liters of oxygen while Ambulating = na%  Please briefly explain why patient needs home oxygen: 

## 2018-12-07 NOTE — Progress Notes (Signed)
ANTICOAGULATION CONSULT NOTE - Initial Consult  Pharmacy Consult for warfarin Indication: atrial fibrillation  Allergies  Allergen Reactions  . Captopril   . Morphine And Related   . Penicillins     Patient Measurements: Height: 5\' 10"  (177.8 cm) Weight: 236 lb 11.2 oz (107.4 kg) IBW/kg (Calculated) : 73 Heparin Dosing Weight: 96 kg  Vital Signs: Temp: 98.7 F (37.1 C) (01/10 0852) Temp Source: Oral (01/10 0852) BP: 165/87 (01/10 0852) Pulse Rate: 79 (01/10 0852)  Labs: Recent Labs    12/05/18 0316 12/06/18 0647 12/07/18 0515 12/07/18 1015  HGB 10.0*  --   --  10.2*  HCT 30.1*  --   --  30.2*  PLT 141*  --   --  146*  LABPROT 23.7* 24.0* 20.3*  --   INR 2.15 2.18 1.76  --   CREATININE 1.40* 1.44*  --  1.29*    Estimated Creatinine Clearance: 56.1 mL/min (A) (by C-G formula based on SCr of 1.29 mg/dL (H)).   Medical History: Past Medical History:  Diagnosis Date  . Anemia   . Anxiety   . Arthritis   . Benign hypertension   . Cardiopulmonary arrest (HCC)   . CHF (congestive heart failure) (HCC)   . Coronary atherosclerosis of native coronary artery   . Depression   . Diabetes (HCC)   . Glaucoma   . Hyperlipidemia   . Mitral valve disorder   . Neuralgia   . PVD (peripheral vascular disease) (HCC)     Medications:  Scheduled:  . aspirin EC  81 mg Oral Daily  . brimonidine  1 drop Both Eyes BID  . buPROPion  150 mg Oral Daily  . clopidogrel  75 mg Oral Daily  . escitalopram  5 mg Oral Daily  . ferrous gluconate  324 mg Oral Q breakfast  . gabapentin  100 mg Oral TID  . insulin aspart  0-15 Units Subcutaneous TID WC  . insulin aspart  0-5 Units Subcutaneous QHS  . insulin aspart protamine- aspart  25 Units Subcutaneous BID WC  . latanoprost  1 drop Both Eyes BID  . pantoprazole  40 mg Oral Daily  . sertraline  50 mg Oral Daily  . simvastatin  40 mg Oral Daily  . sodium chloride flush  10 mL Intravenous Q12H  . sodium chloride flush  10-40 mL  Intracatheter Q12H  . warfarin  7.5 mg Oral q1800    Assessment: Patient arrived for chills and is flu +. Patient also w/ h/o afib is anticoagulated w/ warfarin PTA. Warfarin home dose: 7.5 mg daily  01/06 INR 1.74 01/07 INR 1.93 01/08 INR 2.15 01/09 INR 2.18 01/10 INR 1.76  Goal of Therapy:  INR 2-3 Monitor platelets by anticoagulation protocol: Yes   Plan:  INR currently subtherapeutic at 1.76. Will order one time dose of Warfarin 10 mg @ 1800. Will monitor daily CBC's and adjust per daily INRs.  Patient is on antibiotics which may cause an elevation in INR therefore upon discontinuation of antibiotics patient's warfarin requirement may increase.  Abbie Sons, PharmD Clinical Pharmacist 12/07/2018

## 2018-12-07 NOTE — Progress Notes (Signed)
PHARMACY CONSULT NOTE FOR:  OUTPATIENT  PARENTERAL ANTIBIOTIC THERAPY (OPAT)  Indication: Group B strep bacteremia Regimen: ceftriaxone 2gm IV q24h End date: 12/18/2018  IV antibiotic discharge orders are pended. To discharging provider:  please sign these orders via discharge navigator,  Select New Orders & click on the button choice - Manage This Unsigned Work.     Thank you for allowing pharmacy to be a part of this patient's care.  Juliette Alcide, PharmD, BCPS.   Work Cell: [phone] 12/07/2018 10:07 AM

## 2018-12-08 LAB — CULTURE, BLOOD (ROUTINE X 2)
Culture: NO GROWTH
Special Requests: ADEQUATE

## 2018-12-09 LAB — CULTURE, BLOOD (SINGLE)
Culture: NO GROWTH
Special Requests: ADEQUATE

## 2018-12-10 ENCOUNTER — Telehealth: Payer: Self-pay

## 2018-12-10 NOTE — Telephone Encounter (Signed)
Flagged on EMMI report for not having a follow up scheduled.  Called and spoke with patient.  He confirmed he was able to schedule his follow ups with his PCP and with Dr. Rivka Safer. No other questions or concerns at this time.  I thanked him for his time and informed him he would receive one more automated call checking in over the next few days.

## 2018-12-12 ENCOUNTER — Telehealth: Payer: Self-pay

## 2018-12-12 NOTE — Telephone Encounter (Signed)
EMMI Follow-up: Jeffrey Mills had received an automated call this morning while he was out and he left me a voice message to call him. I talked with Jeffrey Mills and explained our process of two automated calls post discharge to see how he was doing. He had gotten his Rx filled and was aware of his follow-up appointment. Said everything was going well and no needs noted for today.

## 2018-12-19 NOTE — Discharge Summary (Signed)
[facility] - [place] at [facility]   PATIENT NAME: Jeffrey Mills    MR#:  [id number]  DATE OF BIRTH:  14-Jan-1936  DATE OF ADMISSION:  12/02/2018 ADMITTING PHYSICIAN: Barbaraann Rondo, MD  DATE OF DISCHARGE: 12/07/2018 11:59 AM  PRIMARY CARE PHYSICIAN: Danella Penton, MD    ADMISSION DIAGNOSIS:  Lactic acidosis [E87.2] Sepsis, due to unspecified organism, unspecified whether acute organ dysfunction present (HCC) [A41.9]  DISCHARGE DIAGNOSIS:  Active Problems:   Sepsis (HCC)   SECONDARY DIAGNOSIS:   Past Medical History:  Diagnosis Date  . Anemia   . Anxiety   . Arthritis   . Benign hypertension   . Cardiopulmonary arrest (HCC)   . CHF (congestive heart failure) (HCC)   . Coronary atherosclerosis of native coronary artery   . Depression   . Diabetes (HCC)   . Glaucoma   . Hyperlipidemia   . Mitral valve disorder   . Neuralgia   . PVD (peripheral vascular disease) (HCC)     HOSPITAL COURSE:   84M w/ PMHx T2IDDM, HTN, HLD, CAD/MI (s/p CABG x4 + MVR, 1999; s/p stents), Hx CHF (EF 55% as of 04/17/2017 Echo), Afib (Coumadin, goal INR 2.0-3.0 per note by Dr. Darrold Junker, 10/29/2018) p/w rigors, N/V, fever, nocturia, sepsis, suspected L pyelonephritis. Hyperglycemia (w/ T2IDDM), Cr elevation/CKD III, lactate elevation, mild leukocytosis, macrocytic anemia. Subtherapeutic INR.  * Sepsis- bacteremia- Pyelonephritis ruled out. No clear source for bacteremia. lactate elevation, mild leukocytosis:   High-grade fever (T 104F) in ED, tachycardic, tachypneic; WBC high.  CT (+) "Heterogeneous enhancement of the left kidney mild perinephric edema,  U/A negartive. UCx, BCx sent. PCT < 0.10.   Started on broad-spectrum ABx in ED (Vancomycin + Cefepime + Flagyl)  IVF  changed to rocephine as Bl cx have strep Agalactiae  TEE done, negative for vegetations.  ID consult to help decide source and Guide Abx therapy.  Suggest 2 weeks IV , get PICC line.   *  Hyperglycemia, T2IDDM: SSI. 75/25 BID  * CKD III: Cr 1.44 on present admission, baseline 1.3-1.5. Baseline CKD III (2/2 DM, HTN, aged kidney). IVF. Monitor BMP, avoid nephrotoxins.  * Macrocytic anemia: B12, folate pending.  * Subtherapeutic INR: Coumadin, goal INR 2.0-3.0. INR 1.74 on present admission.  Pharmacy to help dosing.   DISCHARGE CONDITIONS:   Stable.  CONSULTS OBTAINED:  Treatment Team:  Barbaraann Rondo, MD Lynn Ito, MD  DRUG ALLERGIES:   Allergies  Allergen Reactions  . Captopril   . Morphine And Related   . Penicillins     DISCHARGE MEDICATIONS:   Allergies as of 12/07/2018      Reactions   Captopril    Morphine And Related    Penicillins       Medication List    STOP taking these medications   apixaban 2.5 MG Tabs tablet Commonly known as:  ELIQUIS   clindamycin 150 MG capsule Commonly known as:  CLEOCIN     TAKE these medications   acetaminophen 325 MG tablet Commonly known as:  TYLENOL Take 650 mg by mouth every 6 (six) hours as needed.   ALPRAZolam 0.5 MG tablet Commonly known as:  XANAX Take 0.5 mg by mouth at bedtime as needed for anxiety.   aspirin 81 MG tablet Take 81 mg by mouth daily.   brimonidine 0.2 % ophthalmic solution Commonly known as:  ALPHAGAN Place 1 drop into both eyes 2 (two) times daily.   clopidogrel 75 MG tablet Commonly known as:  PLAVIX Take 75 mg by mouth daily.   escitalopram 5 MG tablet Commonly known as:  LEXAPRO Take 5 mg by mouth daily.   ferrous gluconate 324 MG tablet Commonly known as:  FERGON Take 324 mg by mouth daily with breakfast.   gabapentin 100 MG capsule Commonly known as:  NEURONTIN Take 100 mg by mouth 3 (three) times daily.   hyoscyamine 0.125 MG tablet Commonly known as:  LEVSIN, ANASPAZ Take 0.125 mg by mouth 3 (three) times daily as needed.   insulin lispro protamine-lispro (75-25) 100 UNIT/ML Susp injection Commonly known as:  HUMALOG 75/25  MIX Inject 38 Units into the skin 2 (two) times daily with a meal.   INSULIN SYRINGE .3CC/31GX5/16" 31G X 5/16" 0.3 ML Misc by Does not apply route.   lansoprazole 30 MG capsule Commonly known as:  PREVACID Take 30 mg by mouth daily at 12 noon.   latanoprost 0.005 % ophthalmic solution Commonly known as:  XALATAN Place 1 drop into both eyes 2 (two) times daily.   meclizine 25 MG tablet Commonly known as:  ANTIVERT Take 25 mg by mouth 3 (three) times daily as needed for dizziness.   metFORMIN 500 MG tablet Commonly known as:  GLUCOPHAGE Take 1,000 mg by mouth 2 (two) times daily with a meal.   metoprolol succinate 25 MG 24 hr tablet Commonly known as:  TOPROL-XL Take 25 mg by mouth daily.   NOVOLIN R 100 units/mL injection Generic drug:  insulin regular Inject into the skin 3 (three) times daily before meals. Sliding Scale dose max of 10 units daily   pantoprazole 40 MG tablet Commonly known as:  PROTONIX Take 40 mg by mouth daily.   sertraline 50 MG tablet Commonly known as:  ZOLOFT Take 50 mg by mouth daily.   simvastatin 40 MG tablet Commonly known as:  ZOCOR Take 40 mg by mouth daily.   triamcinolone cream 0.1 % Commonly known as:  KENALOG Apply 1 application topically 2 (two) times daily.   VICTOZA [place] Inject 1.8 mg into the skin daily.   vitamin B-12 500 MCG tablet Commonly known as:  CYANOCOBALAMIN Take 500 mcg by mouth daily.   warfarin 5 MG tablet Commonly known as:  COUMADIN Take 7.5 mg by mouth daily.   WELLBUTRIN XL 150 MG 24 hr tablet Generic drug:  buPROPion Take 150 mg by mouth daily.     ASK your doctor about these medications   cefTRIAXone  IVPB Commonly known as:  ROCEPHIN Inject 2 g into the vein daily for 10 days. Indication:  Group B strep bacteremia Last Day of Therapy:  12/18/2018 Labs weekly while on IV antibiotics: CBC with differential, CMP, CRP Ask about: Should I take this medication?            Home Infusion  Instuctions  (From admission, onward)         Start     Ordered   12/07/18 0000  Home infusion instructions [facility] May follow [facility] Dosing Protocol; May administer Cathflo as needed to maintain patency of vascular access device.; Flushing of vascular access device: per [facility] Protocol: 0.9% NaCl pre/post medica...    Question Answer Comment  Instructions May follow [facility] Dosing Protocol   Instructions May administer Cathflo as needed to maintain patency of vascular access device.   Instructions Flushing of vascular access device: per [facility] Protocol: 0.9% NaCl pre/post medication administration and prn patency; Heparin 100 u/ml, 31ml for implanted ports and Heparin 10u/ml,  5ml for all other central venous catheters.   Instructions May follow [facility] Anaphylaxis Protocol for First Dose Administration in the home: 0.9% NaCl at 25-50 ml/hr to maintain IV access for protocol meds. Epinephrine 0.3 ml IV/IM PRN and Benadryl 25-50 IV/IM PRN s/s of anaphylaxis.   Instructions [facility] Infusion Coordinator (RN) to assist per patient IV care needs in the home PRN.      12/07/18 1248           DISCHARGE INSTRUCTIONS:    Follow with PMD in 1-2 weeks.  If you experience worsening of your admission symptoms, develop shortness of breath, life threatening emergency, suicidal or homicidal thoughts you must seek medical attention immediately by calling 911 or calling your MD immediately  if symptoms less severe.  You Must read complete instructions/literature along with all the possible adverse reactions/side effects for all the Medicines you take and that have been prescribed to you. Take any new Medicines after you have completely understood and accept all the possible adverse reactions/side effects.   Please note  You were cared for by a hospitalist during your hospital stay. If you have any questions about your discharge medications or the care you received while you were  in the hospital after you are discharged, you can call the unit and asked to speak with the hospitalist on call if the hospitalist that took care of you is not available. Once you are discharged, your primary care physician will handle any further medical issues. Please note that NO REFILLS for any discharge medications will be authorized once you are discharged, as it is imperative that you return to your primary care physician (or establish a relationship with a primary care physician if you do not have one) for your aftercare needs so that they can reassess your need for medications and monitor your lab values.    Today   CHIEF COMPLAINT:   Chief Complaint  Patient presents with  . Fever    HISTORY OF PRESENT ILLNESS:  Jeffrey Mills  is a 83 y.o. male with a known history of T2IDDM, HTN, HLD, CAD/MI (s/p CABG x4 + MVR, 1999; s/p stents), Hx CHF (EF 55% as of 04/17/2017 Echo), Afib (Coumadin, goal INR 2.0-3.0 per note by Dr. Darrold JunkerParaschos, 10/29/2018) p/w rigors, fever, sepsis. Pt is AAOx3, but states he feels very weak and tired ("run down" and "wiped out"). Hx obtained from pt's daughter. Pt went to church and then had lunch @~1300PM on Sunday 12/02/2018. He was in his usual state of health when his daughter left @~1330PM. @~1800-1830PM, pt called daughter and told her that he had been trembling/shaking since ~1630. He felt cold and subsequently developed nausea. His daughter got to his house @~2030PM. She felt he appeared pal and called EMS. Was afebrile to EMS, but felt poorly and requested transport to hospital. Vomiting x1 en route, cont'd vomiting after arriving to ED. High-grade fever (T 104F) in ED, tachycardic, tachypneic; SIRS (+). Denies cough, diarrhea, abdominal pain. Endorses diaphoresis. Endorses nocturia (urinating 4-5x/night) x3-4wks, but denies burning, pain, dysuria, frequency, urgency, hesitancy, dribbling, incontinence, pyuria, hematuria. He carries an ill/toxic appearance. CT  C/A/P (+) "Heterogeneous enhancement of the left kidney mild perinephric edema, suspicious for acute pyelonephritis." U/A pending.   VITAL SIGNS:  Blood pressure (!) 148/67, pulse 79, temperature 98.7 F (37.1 C), temperature source Oral, resp. rate 20, height 5\' 10"  (1.778 m), weight 107.4 kg, SpO2 95 %.  I/O:  No intake or output data in the 24  hours ending 12/19/18 2025  PHYSICAL EXAMINATION:   GENERAL:  83 y.o.-year-old patient lying in the bed with no acute distress.  EYES: Pupils equal, round, reactive to light and accommodation. No scleral icterus. Extraocular muscles intact.  HEENT: Head atraumatic, normocephalic. Oropharynx and nasopharynx clear.  NECK:  Supple, no jugular venous distention. No thyroid enlargement, no tenderness.  LUNGS: Normal breath sounds bilaterally, no wheezing, rales,rhonchi or crepitation. No use of accessory muscles of respiration.  CARDIOVASCULAR: S1, S2 normal. No murmurs, rubs, or gallops.  ABDOMEN: Soft, nontender, nondistended. Bowel sounds present. No organomegaly or mass.  EXTREMITIES: No pedal edema, cyanosis, or clubbing. Some chronic changes on both feet on toes- hyperkeratotic but no redness. NEUROLOGIC: Cranial nerves II through XII are intact. Muscle strength 4/5 in all extremities. Sensation intact. Gait not checked.  PSYCHIATRIC: The patient is alert and oriented x 3.  SKIN: No obvious rash, lesion, or ulcer.   DATA REVIEW:   CBC No results for input(s): WBC, HGB, HCT, PLT in the last 168 hours.  Chemistries  No results for input(s): NA, K, CL, CO2, GLUCOSE, BUN, CREATININE, CALCIUM, MG, AST, ALT, ALKPHOS, BILITOT in the last 168 hours.  Invalid input(s): GFRCGP  Cardiac Enzymes No results for input(s): TROPONINI in the last 168 hours.  Microbiology Results  Results for orders placed or performed during the hospital encounter of 12/02/18  Blood Culture (routine x 2)     Status: None   Collection Time: 12/03/18 12:08 AM  Result  Value Ref Range Status   Specimen Description BLOOD LT [facility]  Final   Special Requests   Final    BOTTLES DRAWN AEROBIC AND ANAEROBIC Blood Culture adequate volume   Culture   Final    NO GROWTH 5 DAYS Performed at [facility], [address] [place], [place] [address]    Report Status 12/08/2018 FINAL  Final  Blood Culture (routine x 2)     Status: Abnormal   Collection Time: 12/03/18 12:09 AM  Result Value Ref Range Status   Specimen Description   Final    BLOOD RT H Performed at [facility], [address] [place], [place] [address]    Special Requests   Final    BOTTLES DRAWN AEROBIC AND ANAEROBIC Blood Culture results may not be optimal due to an inadequate volume of blood received in culture bottles Performed at [facility], [address] [place], [place] [address]    Culture  Setup Time   Final    Organism ID to follow ANAEROBIC BOTTLE ONLY GRAM POSITIVE COCCI CRITICAL RESULT CALLED TO, READ BACK BY AND VERIFIED WITH: JASON ROBINS AT 1733 12/03/2018.  TFK Performed at [facility], [address] [place], [place] [address]    Culture GROUP B STREP(S.AGALACTIAE)ISOLATED (A)  Final   Report Status 12/06/2018 FINAL  Final   Organism ID, Bacteria GROUP B STREP(S.AGALACTIAE)ISOLATED  Final      Susceptibility   Group b strep(s.agalactiae)isolated - MIC*    CLINDAMYCIN >=1 RESISTANT Resistant     AMPICILLIN <=0.25 SENSITIVE Sensitive     ERYTHROMYCIN >=8 RESISTANT Resistant     VANCOMYCIN <=0.12 SENSITIVE Sensitive     CEFTRIAXONE <=0.12 SENSITIVE Sensitive     LEVOFLOXACIN 1 SENSITIVE Sensitive     * GROUP B STREP(S.AGALACTIAE)ISOLATED  Blood Culture ID Panel (Reflexed)     Status: Abnormal   Collection Time: 12/03/18 12:09 AM  Result Value Ref Range Status   Enterococcus species NOT DETECTED NOT DETECTED  Final   Listeria monocytogenes NOT DETECTED NOT DETECTED Final   Staphylococcus species NOT DETECTED NOT DETECTED Final    Staphylococcus aureus (BCID) NOT DETECTED NOT DETECTED Final   Streptococcus species DETECTED (A) NOT DETECTED Final    Comment: CRITICAL RESULT CALLED TO, READ BACK BY AND VERIFIED WITH: JASON ROBINS AT 1733 12/03/2018.  TFK    Streptococcus agalactiae DETECTED (A) NOT DETECTED Final    Comment: CRITICAL RESULT CALLED TO, READ BACK BY AND VERIFIED WITH: JASON ROBINS AT 1733 12/03/2018.  TFK    Streptococcus pneumoniae NOT DETECTED NOT DETECTED Final   Streptococcus pyogenes NOT DETECTED NOT DETECTED Final   Acinetobacter baumannii NOT DETECTED NOT DETECTED Final   Enterobacteriaceae species NOT DETECTED NOT DETECTED Final   Enterobacter cloacae complex NOT DETECTED NOT DETECTED Final   Escherichia coli NOT DETECTED NOT DETECTED Final   Klebsiella oxytoca NOT DETECTED NOT DETECTED Final   Klebsiella pneumoniae NOT DETECTED NOT DETECTED Final   Proteus species NOT DETECTED NOT DETECTED Final   Serratia marcescens NOT DETECTED NOT DETECTED Final   Haemophilus influenzae NOT DETECTED NOT DETECTED Final   Neisseria meningitidis NOT DETECTED NOT DETECTED Final   Pseudomonas aeruginosa NOT DETECTED NOT DETECTED Final   Candida albicans NOT DETECTED NOT DETECTED Final   Candida glabrata NOT DETECTED NOT DETECTED Final   Candida krusei NOT DETECTED NOT DETECTED Final   Candida parapsilosis NOT DETECTED NOT DETECTED Final   Candida tropicalis NOT DETECTED NOT DETECTED Final    Comment: Performed at [facility], [address] [place], [place] [address]  Culture, blood (single) w Reflex to ID Panel     Status: None   Collection Time: 12/04/18 11:05 AM  Result Value Ref Range Status   Specimen Description BLOOD BLOOD LEFT HAND  Final   Special Requests   Final    BOTTLES DRAWN AEROBIC AND ANAEROBIC Blood Culture adequate volume   Culture   Final    NO GROWTH 5 DAYS Performed at [facility], [address] [place], [place] [address]    Report Status 12/09/2018 FINAL   Final    RADIOLOGY:  No results found.  EKG:   Orders placed or performed during the hospital encounter of 12/02/18  . EKG 12-Lead  . EKG 12-Lead  . EKG 12-Lead  . EKG 12-Lead      Management plans discussed with the patient, family and they are in agreement.  CODE STATUS:  Code Status History    Date Active Date Inactive Code Status Order ID Comments User Context   12/03/2018 0330 12/07/2018 1714 Full Code [id number]  Barbaraann Rondo, MD ED    Advance Directive Documentation     Most Recent Value  Type of Advance Directive  Living will  Pre-existing out of facility DNR order (yellow form or pink MOST form)  -  "MOST" Form in Place?  -      TOTAL TIME TAKING CARE OF THIS PATIENT: 35 minutes.    Altamese Dilling M.D on 12/19/2018 at 8:25 PM  Between 7am to 6pm - Pager - [phone]  After 6pm go to [email or web address] - password [email or web address]  [facility] [place] [facility]  Office  [phone]  CC: Primary care physician; Danella Penton, MD   Note: This dictation was prepared with Dragon dictation along with smaller phrase technology. Any transcriptional errors that result from this process are unintentional.

## 2018-12-25 ENCOUNTER — Inpatient Hospital Stay: Payer: Medicare Other | Admitting: Infectious Diseases

## 2019-11-08 ENCOUNTER — Inpatient Hospital Stay
Admit: 2019-11-08 | Discharge: 2019-11-08 | Disposition: A | Discharge status: Home / Self Care | Payer: Medicare Other | Attending: Internal Medicine | Admitting: Internal Medicine

## 2019-11-08 ENCOUNTER — Encounter: Payer: Self-pay | Admitting: Emergency Medicine

## 2019-11-08 ENCOUNTER — Emergency Department: Payer: Medicare Other

## 2019-11-08 ENCOUNTER — Inpatient Hospital Stay
Admission: EM | Admit: 2019-11-08 | Discharge: 2019-11-12 | DRG: 811 | Disposition: A | Discharge status: Home / Self Care | Payer: Medicare Other | Attending: Internal Medicine | Admitting: Internal Medicine

## 2019-11-08 DIAGNOSIS — K921 Melena: Secondary | ICD-10-CM | POA: Diagnosis present

## 2019-11-08 DIAGNOSIS — N179 Acute kidney failure, unspecified: Secondary | ICD-10-CM | POA: Diagnosis present

## 2019-11-08 DIAGNOSIS — R0602 Shortness of breath: Secondary | ICD-10-CM

## 2019-11-08 DIAGNOSIS — Z952 Presence of prosthetic heart valve: Secondary | ICD-10-CM

## 2019-11-08 DIAGNOSIS — N189 Chronic kidney disease, unspecified: Secondary | ICD-10-CM | POA: Diagnosis not present

## 2019-11-08 DIAGNOSIS — Z888 Allergy status to other drugs, medicaments and biological substances status: Secondary | ICD-10-CM

## 2019-11-08 DIAGNOSIS — N1831 Chronic kidney disease, stage 3a: Secondary | ICD-10-CM | POA: Diagnosis present

## 2019-11-08 DIAGNOSIS — E119 Type 2 diabetes mellitus without complications: Secondary | ICD-10-CM

## 2019-11-08 DIAGNOSIS — E785 Hyperlipidemia, unspecified: Secondary | ICD-10-CM | POA: Diagnosis present

## 2019-11-08 DIAGNOSIS — K5730 Diverticulosis of large intestine without perforation or abscess without bleeding: Secondary | ICD-10-CM | POA: Diagnosis present

## 2019-11-08 DIAGNOSIS — E669 Obesity, unspecified: Secondary | ICD-10-CM | POA: Diagnosis present

## 2019-11-08 DIAGNOSIS — E1122 Type 2 diabetes mellitus with diabetic chronic kidney disease: Secondary | ICD-10-CM | POA: Diagnosis present

## 2019-11-08 DIAGNOSIS — N183 Chronic kidney disease, stage 3 unspecified: Secondary | ICD-10-CM

## 2019-11-08 DIAGNOSIS — Z8249 Family history of ischemic heart disease and other diseases of the circulatory system: Secondary | ICD-10-CM

## 2019-11-08 DIAGNOSIS — Z8719 Personal history of other diseases of the digestive system: Secondary | ICD-10-CM

## 2019-11-08 DIAGNOSIS — D649 Anemia, unspecified: Secondary | ICD-10-CM | POA: Diagnosis present

## 2019-11-08 DIAGNOSIS — Z880 Allergy status to penicillin: Secondary | ICD-10-CM

## 2019-11-08 DIAGNOSIS — R791 Abnormal coagulation profile: Secondary | ICD-10-CM

## 2019-11-08 DIAGNOSIS — M1990 Unspecified osteoarthritis, unspecified site: Secondary | ICD-10-CM | POA: Diagnosis present

## 2019-11-08 DIAGNOSIS — Z8674 Personal history of sudden cardiac arrest: Secondary | ICD-10-CM

## 2019-11-08 DIAGNOSIS — I5033 Acute on chronic diastolic (congestive) heart failure: Secondary | ICD-10-CM | POA: Diagnosis present

## 2019-11-08 DIAGNOSIS — I083 Combined rheumatic disorders of mitral, aortic and tricuspid valves: Secondary | ICD-10-CM | POA: Diagnosis present

## 2019-11-08 DIAGNOSIS — I2510 Atherosclerotic heart disease of native coronary artery without angina pectoris: Secondary | ICD-10-CM | POA: Diagnosis present

## 2019-11-08 DIAGNOSIS — Z7902 Long term (current) use of antithrombotics/antiplatelets: Secondary | ICD-10-CM

## 2019-11-08 DIAGNOSIS — F419 Anxiety disorder, unspecified: Secondary | ICD-10-CM | POA: Diagnosis present

## 2019-11-08 DIAGNOSIS — H409 Unspecified glaucoma: Secondary | ICD-10-CM | POA: Diagnosis present

## 2019-11-08 DIAGNOSIS — I482 Chronic atrial fibrillation, unspecified: Secondary | ICD-10-CM | POA: Diagnosis present

## 2019-11-08 DIAGNOSIS — Z955 Presence of coronary angioplasty implant and graft: Secondary | ICD-10-CM

## 2019-11-08 DIAGNOSIS — I130 Hypertensive heart and chronic kidney disease with heart failure and stage 1 through stage 4 chronic kidney disease, or unspecified chronic kidney disease: Secondary | ICD-10-CM | POA: Diagnosis present

## 2019-11-08 DIAGNOSIS — D62 Acute posthemorrhagic anemia: Principal | ICD-10-CM | POA: Diagnosis present

## 2019-11-08 DIAGNOSIS — E1151 Type 2 diabetes mellitus with diabetic peripheral angiopathy without gangrene: Secondary | ICD-10-CM | POA: Diagnosis present

## 2019-11-08 DIAGNOSIS — Z833 Family history of diabetes mellitus: Secondary | ICD-10-CM

## 2019-11-08 DIAGNOSIS — Z7982 Long term (current) use of aspirin: Secondary | ICD-10-CM

## 2019-11-08 DIAGNOSIS — J9601 Acute respiratory failure with hypoxia: Secondary | ICD-10-CM | POA: Diagnosis present

## 2019-11-08 DIAGNOSIS — K31819 Angiodysplasia of stomach and duodenum without bleeding: Secondary | ICD-10-CM | POA: Diagnosis present

## 2019-11-08 DIAGNOSIS — Z7901 Long term (current) use of anticoagulants: Secondary | ICD-10-CM | POA: Diagnosis not present

## 2019-11-08 DIAGNOSIS — F329 Major depressive disorder, single episode, unspecified: Secondary | ICD-10-CM | POA: Diagnosis present

## 2019-11-08 DIAGNOSIS — Z6833 Body mass index (BMI) 33.0-33.9, adult: Secondary | ICD-10-CM

## 2019-11-08 DIAGNOSIS — Z885 Allergy status to narcotic agent status: Secondary | ICD-10-CM

## 2019-11-08 DIAGNOSIS — Z951 Presence of aortocoronary bypass graft: Secondary | ICD-10-CM

## 2019-11-08 DIAGNOSIS — Z79899 Other long term (current) drug therapy: Secondary | ICD-10-CM

## 2019-11-08 DIAGNOSIS — Z20828 Contact with and (suspected) exposure to other viral communicable diseases: Secondary | ICD-10-CM | POA: Diagnosis present

## 2019-11-08 DIAGNOSIS — Z794 Long term (current) use of insulin: Secondary | ICD-10-CM | POA: Diagnosis not present

## 2019-11-08 DIAGNOSIS — D500 Iron deficiency anemia secondary to blood loss (chronic): Secondary | ICD-10-CM | POA: Diagnosis not present

## 2019-11-08 LAB — IRON AND TIBC
Iron: 30 ug/dL — ABNORMAL LOW (ref 45–182)
Saturation Ratios: 6 % — ABNORMAL LOW (ref 17.9–39.5)
TIBC: 489 ug/dL — ABNORMAL HIGH (ref 250–450)
UIBC: 459 ug/dL

## 2019-11-08 LAB — CBC WITH DIFFERENTIAL/PLATELET
Abs Immature Granulocytes: 0.05 10*3/uL (ref 0.00–0.07)
Basophils Absolute: 0 10*3/uL (ref 0.0–0.1)
Basophils Relative: 0 %
Eosinophils Absolute: 0.1 10*3/uL (ref 0.0–0.5)
Eosinophils Relative: 2 %
HCT: 13.6 % — CL (ref 39.0–52.0)
Hemoglobin: 3.9 g/dL — CL (ref 13.0–17.0)
Immature Granulocytes: 1 %
Lymphocytes Relative: 16 %
Lymphs Abs: 1.2 10*3/uL (ref 0.7–4.0)
MCH: 31.5 pg (ref 26.0–34.0)
MCHC: 28.7 g/dL — ABNORMAL LOW (ref 30.0–36.0)
MCV: 109.7 fL — ABNORMAL HIGH (ref 80.0–100.0)
Monocytes Absolute: 0.7 10*3/uL (ref 0.1–1.0)
Monocytes Relative: 9 %
Neutro Abs: 5.6 10*3/uL (ref 1.7–7.7)
Neutrophils Relative %: 72 %
Platelets: 178 10*3/uL (ref 150–400)
RBC: 1.24 MIL/uL — ABNORMAL LOW (ref 4.22–5.81)
RDW: 17.4 % — ABNORMAL HIGH (ref 11.5–15.5)
WBC: 7.7 10*3/uL (ref 4.0–10.5)
nRBC: 0.8 % — ABNORMAL HIGH (ref 0.0–0.2)

## 2019-11-08 LAB — COMPREHENSIVE METABOLIC PANEL
ALT: 34 U/L (ref 0–44)
AST: 28 U/L (ref 15–41)
Albumin: 3.4 g/dL — ABNORMAL LOW (ref 3.5–5.0)
Alkaline Phosphatase: 77 U/L (ref 38–126)
Anion gap: 11 (ref 5–15)
BUN: 34 mg/dL — ABNORMAL HIGH (ref 8–23)
CO2: 16 mmol/L — ABNORMAL LOW (ref 22–32)
Calcium: 8.5 mg/dL — ABNORMAL LOW (ref 8.9–10.3)
Chloride: 112 mmol/L — ABNORMAL HIGH (ref 98–111)
Creatinine, Ser: 1.84 mg/dL — ABNORMAL HIGH (ref 0.61–1.24)
GFR calc Af Amer: 39 mL/min — ABNORMAL LOW (ref >60)
GFR calc non Af Amer: 34 mL/min — ABNORMAL LOW (ref >60)
Glucose, Bld: 116 mg/dL — ABNORMAL HIGH (ref 70–99)
Potassium: 4.6 mmol/L (ref 3.5–5.1)
Sodium: 139 mmol/L (ref 135–145)
Total Bilirubin: 1 mg/dL (ref 0.3–1.2)
Total Protein: 6.9 g/dL (ref 6.5–8.1)

## 2019-11-08 LAB — BASIC METABOLIC PANEL
Anion gap: 12 (ref 5–15)
BUN: 34 mg/dL — ABNORMAL HIGH (ref 8–23)
CO2: 15 mmol/L — ABNORMAL LOW (ref 22–32)
Calcium: 8.4 mg/dL — ABNORMAL LOW (ref 8.9–10.3)
Chloride: 113 mmol/L — ABNORMAL HIGH (ref 98–111)
Creatinine, Ser: 1.81 mg/dL — ABNORMAL HIGH (ref 0.61–1.24)
GFR calc Af Amer: 40 mL/min — ABNORMAL LOW (ref >60)
GFR calc non Af Amer: 34 mL/min — ABNORMAL LOW (ref >60)
Glucose, Bld: 116 mg/dL — ABNORMAL HIGH (ref 70–99)
Potassium: 4.6 mmol/L (ref 3.5–5.1)
Sodium: 140 mmol/L (ref 135–145)

## 2019-11-08 LAB — HEMOGLOBIN AND HEMATOCRIT, BLOOD
HCT: 22.6 % — ABNORMAL LOW (ref 39.0–52.0)
Hemoglobin: 7.2 g/dL — ABNORMAL LOW (ref 13.0–17.0)

## 2019-11-08 LAB — PREPARE RBC (CROSSMATCH)

## 2019-11-08 LAB — TROPONIN I (HIGH SENSITIVITY)
Troponin I (High Sensitivity): 12 ng/L (ref <18)
Troponin I (High Sensitivity): 14 ng/L (ref <18)

## 2019-11-08 LAB — GLUCOSE, CAPILLARY
Glucose-Capillary: 53 mg/dL — ABNORMAL LOW (ref 70–99)
Glucose-Capillary: 103 mg/dL — ABNORMAL HIGH (ref 70–99)
Glucose-Capillary: 103 mg/dL — ABNORMAL HIGH (ref 70–99)
Glucose-Capillary: 94 mg/dL (ref 70–99)
Glucose-Capillary: 89 mg/dL (ref 70–99)

## 2019-11-08 LAB — PROTIME-INR
INR: 1.5 — ABNORMAL HIGH (ref 0.8–1.2)
Prothrombin Time: 17.9 seconds — ABNORMAL HIGH (ref 11.4–15.2)

## 2019-11-08 LAB — SARS CORONAVIRUS 2 (TAT 6-24 HRS): SARS Coronavirus 2: NEGATIVE

## 2019-11-08 LAB — FOLATE: Folate: 12.1 ng/mL (ref >5.9)

## 2019-11-08 LAB — ECHOCARDIOGRAM COMPLETE
Height: 70 in
Weight: 3584 oz

## 2019-11-08 LAB — FERRITIN: Ferritin: 13 ng/mL — ABNORMAL LOW (ref 24–336)

## 2019-11-08 LAB — BRAIN NATRIURETIC PEPTIDE: B Natriuretic Peptide: 467 pg/mL — ABNORMAL HIGH (ref 0.0–100.0)

## 2019-11-08 LAB — ABO/RH: ABO/RH(D): A NEG

## 2019-11-08 MED ORDER — DEXTROSE-NACL 5-0.9 % IV SOLN
INTRAVENOUS | Status: DC
  Administered 2019-11-08 – 2019-11-09 (×2): via INTRAVENOUS

## 2019-11-08 MED ORDER — ESCITALOPRAM OXALATE 10 MG PO TABS
10.0000 mg | ORAL_TABLET | Freq: Every day | ORAL | Status: DC
  Administered 2019-11-08 – 2019-11-12 (×5): 10 mg via ORAL
  Filled 2019-11-08 (×6): qty 1

## 2019-11-08 MED ORDER — PANTOPRAZOLE SODIUM 40 MG IV SOLR
40.0000 mg | Freq: Two times a day (BID) | INTRAVENOUS | Status: DC
  Administered 2019-11-08 – 2019-11-12 (×8): 40 mg via INTRAVENOUS
  Filled 2019-11-08 (×8): qty 40

## 2019-11-08 MED ORDER — ALPRAZOLAM 0.5 MG PO TABS
0.5000 mg | ORAL_TABLET | Freq: Every evening | ORAL | Status: DC | PRN
  Administered 2019-11-08 – 2019-11-11 (×4): 0.5 mg via ORAL
  Filled 2019-11-08 (×5): qty 1

## 2019-11-08 MED ORDER — FUROSEMIDE 10 MG/ML IJ SOLN
40.0000 mg | Freq: Two times a day (BID) | INTRAMUSCULAR | Status: DC
  Administered 2019-11-08: 40 mg via INTRAVENOUS
  Filled 2019-11-08 (×2): qty 4

## 2019-11-08 MED ORDER — DEXTROSE 50 % IV SOLN: 25.0000 g | Freq: Once | INTRAVENOUS | Status: AC

## 2019-11-08 MED ORDER — DEXTROSE 50 % IV SOLN
INTRAVENOUS | Status: AC
  Administered 2019-11-08: 25 g via INTRAVENOUS
  Filled 2019-11-08: qty 50

## 2019-11-08 MED ORDER — METOPROLOL SUCCINATE ER 25 MG PO TB24
25.0000 mg | ORAL_TABLET | Freq: Two times a day (BID) | ORAL | Status: DC
  Administered 2019-11-08 – 2019-11-12 (×9): 25 mg via ORAL
  Filled 2019-11-08 (×10): qty 1

## 2019-11-08 MED ORDER — LATANOPROST 0.005 % OP SOLN
1.0000 [drp] | Freq: Two times a day (BID) | OPHTHALMIC | Status: DC
  Administered 2019-11-08 – 2019-11-12 (×6): 1 [drp] via OPHTHALMIC
  Filled 2019-11-08 (×2): qty 2.5

## 2019-11-08 MED ORDER — FERROUS GLUCONATE 324 (38 FE) MG PO TABS
324.0000 mg | ORAL_TABLET | Freq: Every day | ORAL | Status: DC
  Administered 2019-11-08 – 2019-11-12 (×4): 324 mg via ORAL
  Filled 2019-11-08 (×5): qty 1

## 2019-11-08 MED ORDER — INSULIN ASPART 100 UNIT/ML ~~LOC~~ SOLN
0.0000 [IU] | SUBCUTANEOUS | Status: DC
  Administered 2019-11-09 (×2): 3 [IU] via SUBCUTANEOUS
  Administered 2019-11-09: 2 [IU] via SUBCUTANEOUS
  Filled 2019-11-08 (×4): qty 1

## 2019-11-08 MED ORDER — BUPROPION HCL ER (XL) 150 MG PO TB24
150.0000 mg | ORAL_TABLET | Freq: Every day | ORAL | Status: DC
  Administered 2019-11-08 – 2019-11-12 (×5): 150 mg via ORAL
  Filled 2019-11-08 (×5): qty 1

## 2019-11-08 MED ORDER — BRIMONIDINE TARTRATE 0.2 % OP SOLN
1.0000 [drp] | Freq: Two times a day (BID) | OPHTHALMIC | Status: DC
  Administered 2019-11-08 – 2019-11-12 (×8): 1 [drp] via OPHTHALMIC
  Filled 2019-11-08 (×2): qty 5

## 2019-11-08 MED ORDER — ACETAMINOPHEN 325 MG PO TABS: 650.0000 mg | ORAL_TABLET | Freq: Four times a day (QID) | ORAL | Status: DC | PRN

## 2019-11-08 MED ORDER — CHLORHEXIDINE GLUCONATE CLOTH 2 % EX PADS
6.0000 | MEDICATED_PAD | Freq: Every day | CUTANEOUS | Status: DC
  Administered 2019-11-08 – 2019-11-12 (×4): 6 via TOPICAL

## 2019-11-08 MED ORDER — SODIUM CHLORIDE 0.9% IV SOLUTION
Freq: Once | INTRAVENOUS | Status: DC
  Filled 2019-11-08: qty 250

## 2019-11-08 MED ORDER — PANTOPRAZOLE SODIUM 40 MG IV SOLR
40.0000 mg | INTRAVENOUS | Status: DC
  Administered 2019-11-08: 40 mg via INTRAVENOUS
  Filled 2019-11-08: qty 40

## 2019-11-08 MED ORDER — SODIUM CHLORIDE 0.9 % IV SOLN: 10.0000 mL/h | Freq: Once | INTRAVENOUS | Status: DC

## 2019-11-08 MED ORDER — SIMVASTATIN 20 MG PO TABS
40.0000 mg | ORAL_TABLET | Freq: Every day | ORAL | Status: DC
  Administered 2019-11-08 – 2019-11-11 (×4): 40 mg via ORAL
  Filled 2019-11-08: qty 2
  Filled 2019-11-08: qty 1
  Filled 2019-11-08 (×2): qty 2

## 2019-11-08 MED ORDER — CYANOCOBALAMIN 500 MCG PO TABS
500.0000 ug | ORAL_TABLET | Freq: Every day | ORAL | Status: DC
  Administered 2019-11-08 – 2019-11-12 (×5): 500 ug via ORAL
  Filled 2019-11-08 (×5): qty 1

## 2019-11-08 MED ORDER — SODIUM CHLORIDE 0.9 % IV SOLN: INTRAVENOUS | Status: DC

## 2019-11-08 NOTE — ED Notes (Signed)
Patient blood increased to 110ml/her

## 2019-11-08 NOTE — ED Notes (Signed)
Q15 minute transfusion vital signs stable. Will continue to monitor throughout transfusion. Patient currently denies sob/cp or any other concerns.

## 2019-11-08 NOTE — ED Notes (Signed)
Patient tolerating transfusion well, vss. accu check completed . Scheduled meds given. Patient sitting in bed watching TV comfortably.

## 2019-11-08 NOTE — ED Notes (Addendum)
2nd unit completed, vss. third unit ordered from lab will call when ready for pick up.

## 2019-11-08 NOTE — ED Notes (Signed)
ED TO INPATIENT HANDOFF REPORT  ED Nurse Name and Phone #:  Tom RN  S Name/Age/Gender Jeffrey Mills 83 y.o. male Room/Bed: ED37A/ED37A  Code Status   Code Status: Full Code  Home/SNF/Other Home Patient oriented to: self, place, time and situation Is this baseline? Yes   Triage Complete: Triage complete  Chief Complaint Symptomatic anemia [D64.9]  Triage Note Pt reports increased SOB over the last 3 days. Pt takes Warfrin and was due to have labs on 12/10 but was weak and SOB. Pt reports tonight SOB worsened. 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     Allergies Allergies  Allergen Reactions  . Captopril Other (See Comments)    Reaction: unknown  . Morphine And Related Other (See Comments)    Reaction: unknown  . Penicillins Other (See Comments)    Reaction: unknown Did it involve swelling of the face/tongue/throat, SOB, or low BP? Unknown Did it involve sudden or severe rash/hives, skin peeling, or any reaction on the inside of your mouth or nose? Unknown Did you need to seek medical attention at a hospital or doctor's office? Unknown When did it last happen?unknown If all above answers are "NO", may proceed with cephalosporin use.     Level of Care/Admitting Diagnosis ED Disposition    ED Disposition Condition Comment   Admit  Hospital Area: [facility] [[id number]]  Level of Care: Telemetry [5]  Covid Evaluation: Asymptomatic Screening Protocol (No Symptoms)  Diagnosis: Symptomatic anemia [1610960]  Admitting Physician: Andris Baumann [[id number]]  Attending Physician: Andris Baumann [[id number]]  Estimated length of stay: 3 - 4 days  Certification:: I certify this patient will need inpatient services for at least 2 midnights       B Medical/Surgery History Past Medical History:  Diagnosis Date  . Anemia   . Anxiety   . Arthritis   . Benign hypertension   . Cardiopulmonary arrest (HCC)   . CHF (congestive heart  failure) (HCC)   . Coronary atherosclerosis of native coronary artery   . Depression   . Diabetes (HCC)   . Glaucoma   . Hyperlipidemia   . Mitral valve disorder   . Neuralgia   . PVD (peripheral vascular disease) (HCC)    Past Surgical History:  Procedure Laterality Date  . CHOLECYSTECTOMY    . CORONARY ANGIOPLASTY WITH STENT PLACEMENT    . CORONARY ARTERY BYPASS GRAFT    . MITRAL VALVE ANNULOPLASTY    . TEE WITHOUT CARDIOVERSION N/A 12/05/2018   Procedure: TRANSESOPHAGEAL ECHOCARDIOGRAM (TEE);  Surgeon: Dalia Heading, MD;  Location: [facility] ORS;  Service: Cardiovascular;  Laterality: N/A;     A IV Location/Drains/Wounds Patient Lines/Drains/Airways Status   Active Line/Drains/Airways    Name:   Placement date:   Placement time:   Site:   Days:   Peripheral IV 11/08/19 Right Antecubital   11/08/19    0323    Antecubital   less than 1   Peripheral IV 11/08/19 Left Antecubital   11/08/19    0430    Antecubital   less than 1   PICC Single Lumen 12/06/18 PICC Right Basilic 44 cm 0 cm   12/06/18    1050    Basilic   337   Wound / Incision (Open or Dehisced) 12/05/18 Diabetic ulcer Foot Right dark brown round hard   12/05/18    2029    Foot   338  Intake/Output Last 24 hours  Intake/Output Summary (Last 24 hours) at 11/08/2019 1950 Last data filed at 11/08/2019 1522 Gross per 24 hour  Intake 1260 ml  Output 2375 ml  Net -1115 ml    Labs/Imaging Results for orders placed or performed during the hospital encounter of 11/08/19 (from the past 48 hour(s))  Basic metabolic panel     Status: Abnormal   Collection Time: 11/08/19  3:18 AM  Result Value Ref Range   Sodium 140 135 - 145 mmol/L   Potassium 4.6 3.5 - 5.1 mmol/L   Chloride 113 (H) 98 - 111 mmol/L   CO2 15 (L) 22 - 32 mmol/L   Glucose, Bld 116 (H) 70 - 99 mg/dL   BUN 34 (H) 8 - 23 mg/dL   Creatinine, Ser 4.49 (H) 0.61 - 1.24 mg/dL   Calcium 8.4 (L) 8.9 - 10.3 mg/dL   GFR calc non Af Amer 34 (L) >60 mL/min    GFR calc Af Amer 40 (L) >60 mL/min   Anion gap 12 5 - 15    Comment: Performed at [facility], [address] [place], [place] [address]  Troponin I (High Sensitivity)     Status: None   Collection Time: 11/08/19  3:18 AM  Result Value Ref Range   Troponin I (High Sensitivity) 12 <18 ng/L    Comment: (NOTE) Elevated high sensitivity troponin I (hsTnI) values and significant  changes across serial measurements may suggest ACS but many other  chronic and acute conditions are known to elevate hsTnI results.  Refer to the "Links" section for chest pain algorithms and additional  guidance. Performed at [facility], [address] [place], [place] [address]   Protime-INR     Status: Abnormal   Collection Time: 11/08/19  3:18 AM  Result Value Ref Range   Prothrombin Time 17.9 (H) 11.4 - 15.2 seconds   INR 1.5 (H) 0.8 - 1.2    Comment: (NOTE) INR goal varies based on device and disease states. Performed at [facility], [address] [place], [place] [address]   ABO/Rh     Status: None   Collection Time: 11/08/19  3:18 AM  Result Value Ref Range   ABO/RH(D)      A NEG Performed at [facility], [address] [place], [place] [address]   Iron and TIBC     Status: Abnormal   Collection Time: 11/08/19  3:18 AM  Result Value Ref Range   Iron 30 (L) 45 - 182 ug/dL   TIBC 793 (H) 903 - 009 ug/dL   Saturation Ratios 6 (L) 17.9 - 39.5 %   UIBC 459 ug/dL    Comment: Performed at [facility], [address] [place], [place] [address]  Folate     Status: None   Collection Time: 11/08/19  3:18 AM  Result Value Ref Range   Folate 12.1 >5.9 ng/mL    Comment: Performed at [facility], [address] [place], [place] [address]  CBC with Differential     Status: Abnormal   Collection Time: 11/08/19  3:27 AM  Result Value Ref Range   WBC 7.7 4.0 - 10.5 K/uL   RBC 1.24 (L) 4.22 - 5.81 MIL/uL   Hemoglobin 3.9 (LL) 13.0 -  17.0 g/dL    Comment: REPEATED TO VERIFY THIS CRITICAL RESULT HAS VERIFIED AND BEEN CALLED TO REBECCA UHORCHUCK RN BY HANNAH MILES ON 12 11 2020 AT 0415, AND HAS BEEN READ  BACK. NEW SAMPLE CONFIRMED CRITCALS BY RECOLLECT    HCT 13.6 (LL) 39.0 - 52.0 %    Comment: REPEATED TO VERIFY THIS CRITICAL RESULT HAS VERIFIED AND BEEN CALLED TO REBECCA UHORCHUCK RN BY HANNAH MILES ON 12 11 2020 AT 0415, AND HAS BEEN READ BACK. NEW SAMPLE CONFIRMED CRITCALS BY RECOLLECT    MCV 109.7 (H) 80.0 - 100.0 fL   MCH 31.5 26.0 - 34.0 pg   MCHC 28.7 (L) 30.0 - 36.0 g/dL   RDW 98.1 (H) 19.1 - 47.8 %   Platelets 178 150 - 400 K/uL   nRBC 0.8 (H) 0.0 - 0.2 %   Neutrophils Relative % 72 %   Neutro Abs 5.6 1.7 - 7.7 K/uL   Lymphocytes Relative 16 %   Lymphs Abs 1.2 0.7 - 4.0 K/uL   Monocytes Relative 9 %   Monocytes Absolute 0.7 0.1 - 1.0 K/uL   Eosinophils Relative 2 %   Eosinophils Absolute 0.1 0.0 - 0.5 K/uL   Basophils Relative 0 %   Basophils Absolute 0.0 0.0 - 0.1 K/uL   Immature Granulocytes 1 %   Abs Immature Granulocytes 0.05 0.00 - 0.07 K/uL    Comment: Performed at [facility], [address] [place], [place] [address]  Comprehensive metabolic panel     Status: Abnormal   Collection Time: 11/08/19  3:27 AM  Result Value Ref Range   Sodium 139 135 - 145 mmol/L   Potassium 4.6 3.5 - 5.1 mmol/L   Chloride 112 (H) 98 - 111 mmol/L   CO2 16 (L) 22 - 32 mmol/L   Glucose, Bld 116 (H) 70 - 99 mg/dL   BUN 34 (H) 8 - 23 mg/dL   Creatinine, Ser 1.30 (H) 0.61 - 1.24 mg/dL   Calcium 8.5 (L) 8.9 - 10.3 mg/dL   Total Protein 6.9 6.5 - 8.1 g/dL   Albumin 3.4 (L) 3.5 - 5.0 g/dL   AST 28 15 - 41 U/L   ALT 34 0 - 44 U/L   Alkaline Phosphatase 77 38 - 126 U/L   Total Bilirubin 1.0 0.3 - 1.2 mg/dL   GFR calc non Af Amer 34 (L) >60 mL/min   GFR calc Af Amer 39 (L) >60 mL/min   Anion gap 11 5 - 15    Comment: Performed at [facility], [address] [place], [place] [address]  Brain  natriuretic peptide     Status: Abnormal   Collection Time: 11/08/19  3:59 AM  Result Value Ref Range   B Natriuretic Peptide 467.0 (H) 0.0 - 100.0 pg/mL    Comment: Performed at [facility], [address] [place], [place] [address]  Type and screen     Status: None (Preliminary result)   Collection Time: 11/08/19  4:19 AM  Result Value Ref Range   ABO/RH(D) A NEG    Antibody Screen NEG    Sample Expiration 11/11/2019,2359    Unit Number [id number]    Blood Component Type RED CELLS,LR    Unit division 00    Status of Unit ISSUED    Transfusion Status OK TO TRANSFUSE    Crossmatch Result COMPATIBLE    Unit tag comment EMERGENCY RELEASE    Unit Number [id number]    Blood Component Type RED CELLS,LR    Unit division 00    Status of Unit ISSUED    Transfusion Status OK TO TRANSFUSE    Crossmatch Result COMPATIBLE    Unit tag comment EMERGENCY RELEASE  Unit Number [id number]    Blood Component Type RED CELLS,LR    Unit division 00    Status of Unit ISSUED    Transfusion Status OK TO TRANSFUSE    Crossmatch Result      Compatible Performed at [facility], [address] [place], [place] [address]   Prepare RBC     Status: None   Collection Time: 11/08/19  4:19 AM  Result Value Ref Range   Order Confirmation      DUPLICATE REQUEST Performed at [facility], [address] [place], [place] [address]   Prepare RBC     Status: None   Collection Time: 11/08/19  4:56 AM  Result Value Ref Range   Order Confirmation      ORDER PROCESSED BY BLOOD BANK Performed at [facility], [address] [place], [place] [address]   SARS CORONAVIRUS 2 (TAT 6-24 HRS) Nasopharyngeal Nasopharyngeal Swab     Status: None   Collection Time: 11/08/19  5:32 AM   Specimen: Nasopharyngeal Swab  Result Value Ref Range   SARS Coronavirus 2 NEGATIVE NEGATIVE    Comment: (NOTE) SARS-CoV-2 target nucleic acids are NOT DETECTED. The SARS-CoV-2  RNA is generally detectable in upper and lower respiratory specimens during the acute phase of infection. Negative results do not preclude SARS-CoV-2 infection, do not rule out co-infections with other pathogens, and should not be used as the sole basis for treatment or other patient management decisions. Negative results must be combined with clinical observations, patient history, and epidemiological information. The expected result is Negative. Fact Sheet for Patients: HairSlick.nohttps://www.fda.gov/media/138098/download Fact Sheet for Healthcare Providers: quierodirigir.comhttps://www.fda.gov/media/138095/download This test is not yet approved or cleared by the [place] FDA and  has been authorized for detection and/or diagnosis of SARS-CoV-2 by FDA under an Emergency Use Authorization (EUA). This EUA will remain  in effect (meaning this test can be used) for the duration of the COVID-19 declaration under Section 56 4(b)(1) of the Act, 21 U.S.C. section 360bbb-3(b)(1), unless the authorization is terminated or revoked sooner. Performed at [facility], [address] [place], [place] [address]   Glucose, capillary     Status: Abnormal   Collection Time: 11/08/19  8:26 AM  Result Value Ref Range   Glucose-Capillary 53 (L) 70 - 99 mg/dL  Glucose, capillary     Status: Abnormal   Collection Time: 11/08/19 10:01 AM  Result Value Ref Range   Glucose-Capillary 103 (H) 70 - 99 mg/dL  Prepare RBC (crossmatch)     Status: None   Collection Time: 11/08/19 10:30 AM  Result Value Ref Range   Order Confirmation      ORDER PROCESSED BY BLOOD BANK Performed at [facility], [address] [place], [place] [address]   Glucose, capillary     Status: Abnormal   Collection Time: 11/08/19 12:24 PM  Result Value Ref Range   Glucose-Capillary 103 (H) 70 - 99 mg/dL  Hemoglobin and hematocrit, blood     Status: Abnormal   Collection Time: 11/08/19  4:07 PM  Result Value Ref Range   Hemoglobin 7.2  (L) 13.0 - 17.0 g/dL    Comment: POST TRANSFUSION SPECIMEN   HCT 22.6 (L) 39.0 - 52.0 %    Comment: Performed at [facility], [address] [place], [place] [address]  Glucose, capillary     Status: None   Collection Time: 11/08/19  5:38 PM  Result Value Ref Range   Glucose-Capillary 94  70 - 99 mg/dL   DG Chest Portable 1 View  Result Date: 11/08/2019 CLINICAL DATA:  Shortness of breath EXAM: PORTABLE CHEST 1 VIEW COMPARISON:  December 06, 2018 FINDINGS: The heart size is enlarged. The patient is status post prior median sternotomy. There is no pneumothorax. There is vascular congestion. There are developing perihilar airspace opacities. There is no pneumothorax. IMPRESSION: Cardiomegaly with findings of developing congestive heart failure. Electronically Signed   By: Katherine Mantle M.D.   On: 11/08/2019 03:42   ECHOCARDIOGRAM COMPLETE  Result Date: 11/08/2019   ECHOCARDIOGRAM REPORT   Patient Name:   Jeffrey Mills [facility] Date of Exam: 11/08/2019 Medical Rec #:  [id number]         Height:       70.0 in Accession #:    [id number]        Weight:       224.0 lb Date of Birth:  12-10-1935          BSA:          2.19 m Patient Age:    83 years          BP:           138/76 mmHg Patient Gender: M                 HR:           66 bpm. Exam Location:  [facility] Procedure: 2D Echo, Cardiac Doppler and Color Doppler Indications:     CHF- acute diastolic 428.31  History:         Patient has prior history of Echocardiogram examinations, most                  recent 12/05/2018. CHF; Risk Factors:Diabetes and Hypertension.                  Mitral valve disorder, PVD.  Sonographer:     Cristela Blue RDCS (AE) Referring Phys:  [id number] Andris Baumann Diagnosing Phys: Harold Hedge MD IMPRESSIONS  1. Left ventricular ejection fraction, by visual estimation, is 60 to 65%. The left ventricle has normal function. Left ventricular septal wall thickness was mildly increased. Mildly increased left ventricular posterior wall  thickness. There is mildly increased left ventricular hypertrophy.  2. The left ventricle has no regional wall motion abnormalities.  3. Global right ventricle has normal systolic function.The right ventricular size is mildly enlarged. No increase in right ventricular wall thickness.  4. Left atrial size was mildly dilated.  5. Right atrial size was normal.  6. The mitral valve was not well visualized. Moderate mitral valve regurgitation.  7. The tricuspid valve is not well visualized. Tricuspid valve regurgitation is trivial.  8. The aortic valve was not well visualized. Aortic valve regurgitation is trivial. Mild to moderate aortic valve sclerosis/calcification without any evidence of aortic stenosis.  9. The pulmonic valve was not well visualized. Pulmonic valve regurgitation is trivial. 10. The aortic root was not well visualized. 11. Moderately elevated pulmonary artery systolic pressure. 12. The atrial septum is grossly normal. FINDINGS  Left Ventricle: Left ventricular ejection fraction, by visual estimation, is 60 to 65%. The left ventricle has normal function. The left ventricle has no regional wall motion abnormalities. Mildly increased left ventricular posterior wall thickness. There is mildly increased left ventricular hypertrophy. Right Ventricle: The right ventricular size is mildly enlarged. No increase in right ventricular wall thickness. Global RV systolic function is has normal systolic function. The tricuspid regurgitant  velocity is 2.74 m/s, and with an assumed right atrial  pressure of 10 mmHg, the estimated right ventricular systolic pressure is moderately elevated at 40.0 mmHg. Left Atrium: Left atrial size was mildly dilated. Right Atrium: Right atrial size was normal in size Pericardium: There is no evidence of pericardial effusion. Mitral Valve: The mitral valve was not well visualized. Moderate mitral valve regurgitation. MV peak gradient, 18.5 mmHg. Tricuspid Valve: The tricuspid valve is  not well visualized. Tricuspid valve regurgitation is trivial. Aortic Valve: The aortic valve was not well visualized. Aortic valve regurgitation is trivial. Mild to moderate aortic valve sclerosis/calcification is present, without any evidence of aortic stenosis. Aortic valve mean gradient measures 5.5 mmHg. Aortic  valve peak gradient measures 9.1 mmHg. Aortic valve area, by VTI measures 2.53 cm. Pulmonic Valve: The pulmonic valve was not well visualized. Pulmonic valve regurgitation is trivial. Pulmonic regurgitation is trivial. Aorta: The aortic root was not well visualized. IAS/Shunts: The atrial septum is grossly normal.  LEFT VENTRICLE PLAX 2D LVIDd:         4.42 cm  Diastology LVIDs:         2.92 cm  LV e' lateral:   6.96 cm/s LV PW:         1.08 cm  LV E/e' lateral: 27.3 LV IVS:        1.83 cm  LV e' medial:    5.33 cm/s LVOT diam:     2.00 cm  LV E/e' medial:  35.6 LV SV:         56 ml LV SV Index:   24.60 LVOT Area:     3.14 cm  RIGHT VENTRICLE RV Basal diam:  4.61 cm RV S prime:     8.05 cm/s TAPSE (M-mode): 2.8 cm LEFT ATRIUM              Index       RIGHT ATRIUM           Index LA diam:        4.40 cm  2.01 cm/m  RA Area:     28.90 cm LA Vol (A2C):   107.0 ml 48.85 ml/m RA Volume:   106.00 ml 48.39 ml/m LA Vol (A4C):   74.5 ml  34.01 ml/m LA Biplane Vol: 92.9 ml  42.41 ml/m  AORTIC VALVE                    PULMONIC VALVE AV Area (Vmax):    2.11 cm     PV Vmax:        0.59 m/s AV Area (Vmean):   1.96 cm     PV Peak grad:   1.4 mmHg AV Area (VTI):     2.53 cm     RVOT Peak grad: 2 mmHg AV Vmax:           150.50 cm/s AV Vmean:          106.150 cm/s AV VTI:            0.312 m AV Peak Grad:      9.1 mmHg AV Mean Grad:      5.5 mmHg LVOT Vmax:         101.00 cm/s LVOT Vmean:        66.300 cm/s LVOT VTI:          0.251 m LVOT/AV VTI ratio: 0.80  AORTA Ao Root diam: 3.10 cm MITRAL VALVE  TRICUSPID VALVE MV Area (PHT): 2.29 cm              TR Peak grad:   30.0 mmHg MV Peak  grad:  18.5 mmHg             TR Vmax:        309.00 cm/s MV Mean grad:  6.0 mmHg MV Vmax:       2.15 m/s              SHUNTS MV Vmean:      104.0 cm/s            Systemic VTI:  0.25 m MV VTI:        0.63 m                Systemic Diam: 2.00 cm MV PHT:        95.99 msec MV Decel Time: 331 msec MV E velocity: 190.00 cm/s 103 cm/s MV A velocity: 104.00 cm/s 70.3 cm/s MV E/A ratio:  1.83        1.5  Harold Hedge MD Electronically signed by Harold Hedge MD Signature Date/Time: 11/08/2019/2:37:58 PM    Final     Pending Labs Unresulted Labs (From admission, onward)    Start     Ordered   11/09/19 0500  Basic metabolic panel  Daily,   STAT     11/08/19 0553   11/08/19 1451  Vitamin B12  Add-on,   AD     11/08/19 1450   11/08/19 0447  Hemoglobin A1c  Once,   STAT    Comments: To assess prior glycemic control    11/08/19 0456          Vitals/Pain Today's Vitals   11/08/19 1402 11/08/19 1522 11/08/19 1525 11/08/19 1705  BP: 136/69 128/76 132/74 (!) 154/66  Pulse: 74 74 77 71  Resp:  16 18   Temp:  98.7 F (37.1 C) 98.3 F (36.8 C)   TempSrc:  Oral Oral   SpO2: 100% 98% 98% 100%  Weight:      Height:      PainSc:   0-No pain     Isolation Precautions No active isolations  Medications Medications  0.9 %  sodium chloride infusion (10 mL/hr Intravenous Not Given 11/08/19 1536)  acetaminophen (TYLENOL) tablet 650 mg (has no administration in time range)  metoprolol succinate (TOPROL-XL) 24 hr tablet 25 mg (25 mg Oral Given 11/08/19 0955)  simvastatin (ZOCOR) tablet 40 mg (40 mg Oral Given 11/08/19 1651)  ALPRAZolam (XANAX) tablet 0.5 mg (0.5 mg Oral Given 11/08/19 0835)  buPROPion (WELLBUTRIN XL) 24 hr tablet 150 mg (150 mg Oral Given 11/08/19 1652)  escitalopram (LEXAPRO) tablet 10 mg (10 mg Oral Given 11/08/19 1651)  ferrous gluconate (FERGON) tablet 324 mg (324 mg Oral Given 11/08/19 0955)  vitamin B-12 (CYANOCOBALAMIN) tablet 500 mcg (500 mcg Oral Given 11/08/19 0955)   brimonidine (ALPHAGAN) 0.2 % ophthalmic solution 1 drop (1 drop Both Eyes Given 11/08/19 0952)  latanoprost (XALATAN) 0.005 % ophthalmic solution 1 drop (1 drop Both Eyes Given 11/08/19 0954)  0.9 %  sodium chloride infusion (Manually program via Guardrails IV Fluids) ( Intravenous Not Given 11/08/19 1535)  insulin aspart (novoLOG) injection 0-15 Units (0 Units Subcutaneous Not Given 11/08/19 1738)  dextrose 5 %-0.9 % sodium chloride infusion ( Intravenous Rate/Dose Change 11/08/19 1712)  pantoprazole (PROTONIX) injection 40 mg (has no administration in time range)  dextrose 50 % solution 25 g (25 g  Intravenous Given 11/08/19 0834)    Mobility walks with device Low fall risk   Focused Assessments Cardiac Assessment Handoff:  Cardiac Rhythm: Normal sinus rhythm Lab Results  Component Value Date   CKTOTAL 97 07/24/2017   TROPONINI <0.03 12/02/2018   No results found for: DDIMER Does the Patient currently have chest pain? No     R Recommendations: See Admitting Provider Note  Report given to:   Additional Notes:

## 2019-11-08 NOTE — Progress Notes (Signed)
*  PRELIMINARY RESULTS* Echocardiogram 2D Echocardiogram has been performed.  Jeffrey Mills 11/08/2019, 1:47 PM

## 2019-11-08 NOTE — ED Notes (Signed)
Transfusion completed. Vital signs stable patient being moved to c-pod by charge nurse.

## 2019-11-08 NOTE — Consult Note (Signed)
Wyline MoodKiran Hubbert Landrigan , MD [address], [address], [place], [place], [id number] [address], [place], [place], [address] Phone: [phone]  Fax: [phone]  Consultation  Referring Provider: Emergency room  primary Care Physician:  Danella PentonMiller, Mark F, MD Primary Gastroenterologist: None         Reason for Consultation:     Anemia  Date of Admission:  11/08/2019 Date of Consultation:  11/08/2019         HPI:   Jeffrey LennoxLarry W Mills is a 83 y.o. male who has a past medical history of heart failure, CABG, atrial fibrillation on Coumadin, diabetes, hypertension came with 2-week history of shortness of breath with minimal activity to the emergency room.  In the ER he had an oxygen saturation of 88% on room air and hemoglobin of 3.9 g.  Troponin was elevated at 12.  BNP was 467.  Chest x-ray showed features of congestive heart failure.  Commenced on blood transfusion.  Hemoglobin 11 months back was 10.2 g on admission 3.9 with an MCV of 109.7.  Baseline creatinine 11 months back was 1.29 and presently 1.84.  INR 1.5.   He was seen by his physician on 10/21/2019 and noted to have a hemoglobin of 7.4 g.  History of melena at that time.   He was on Coumadin at that time.  He states for the past few weeks he has had dark tarry stools.  But for the past few days it has been brown in color.  Denies any hematemesis, denies any blood in the urine or nasal bleeds.  He denies any NSAID use.  No abdominal pain.  Past Medical History:  Diagnosis Date  . Anemia   . Anxiety   . Arthritis   . Benign hypertension   . Cardiopulmonary arrest (HCC)   . CHF (congestive heart failure) (HCC)   . Coronary atherosclerosis of native coronary artery   . Depression   . Diabetes (HCC)   . Glaucoma   . Hyperlipidemia   . Mitral valve disorder   . Neuralgia   . PVD (peripheral vascular disease) (HCC)     Past Surgical History:  Procedure Laterality Date  . CHOLECYSTECTOMY    . CORONARY ANGIOPLASTY WITH  STENT PLACEMENT    . CORONARY ARTERY BYPASS GRAFT    . MITRAL VALVE ANNULOPLASTY    . TEE WITHOUT CARDIOVERSION N/A 12/05/2018   Procedure: TRANSESOPHAGEAL ECHOCARDIOGRAM (TEE);  Surgeon: Dalia HeadingFath, Kenneth A, MD;  Location: [facility] ORS;  Service: Cardiovascular;  Laterality: N/A;    Prior to Admission medications   Medication Sig Start Date End Date Taking? Authorizing Provider  ALPRAZolam Prudy Feeler(XANAX) 0.5 MG tablet Take 0.25 mg by mouth 2 (two) times daily as needed for anxiety.    Yes [provider]  aspirin 81 MG tablet Take 81 mg by mouth daily.   Yes [provider]  brimonidine (ALPHAGAN) 0.2 % ophthalmic solution Place 1 drop into both eyes 2 (two) times daily.   Yes [provider]  escitalopram (LEXAPRO) 10 MG tablet Take 10 mg by mouth daily.    Yes [provider]  ferrous gluconate (FERGON) 324 MG tablet Take 324 mg by mouth 2 (two) times daily.    Yes [provider]  gabapentin (NEURONTIN) 100 MG capsule Take 100 mg by mouth daily.    Yes [provider]  insulin lispro protamine-lispro (HUMALOG 75/25 MIX) (75-25) 100 UNIT/ML SUSP injection Inject 55-60 Units into the skin 2 (two) times daily with  a meal. Take 55 units before breakfast and 60 units before supper   Yes [provider]  insulin regular (NOVOLIN R) 100 units/mL injection Inject into the skin 3 (three) times daily before meals. Dose per sliding Scale dose max of 10 units daily   Yes [provider]  latanoprost (XALATAN) 0.005 % ophthalmic solution Place 1 drop into both eyes at bedtime.  05/28/14  Yes [provider]  magnesium oxide (MAG-OX) 400 MG tablet Take 400 mg by mouth daily.   Yes [provider]  meclizine (ANTIVERT) 25 MG tablet Take 25 mg by mouth 3 (three) times daily as needed for dizziness.   Yes [provider]  metFORMIN (GLUCOPHAGE) 500 MG tablet Take 1,000 mg by mouth 2 (two) times daily with a meal.    Yes [provider]  metoprolol succinate (TOPROL-XL) 25 MG 24 hr tablet Take 25 mg by mouth 2 (two) times daily.    Yes [provider]  pantoprazole (PROTONIX) 40 MG tablet Take 40 mg by mouth 2 (two) times daily.    Yes [provider]  simvastatin (ZOCOR) 40 MG tablet Take 40 mg by mouth daily.   Yes [provider]  traMADol (ULTRAM) 50 MG tablet Take 25 mg by mouth 3 (three) times daily as needed for moderate pain.   Yes [provider]  vitamin B-12 (CYANOCOBALAMIN) 500 MCG tablet Take 500 mcg by mouth daily.   Yes [provider]  warfarin (COUMADIN) 5 MG tablet Take 7.5-10 mg by mouth as directed. Take 10 mg on Tuesday and Thursday. Take 7.5 mg all other days.   Yes [provider]  Insulin Syringe-Needle U-100 (INSULIN SYRINGE .3CC/31GX5/16") 31G X 5/16" 0.3 ML MISC by Does not apply route.    [provider]    Family History  Problem Relation Age of Onset  . Heart disease Father   . ALS Father   . Hypertension Mother   . Breast cancer Mother   . Heart attack Paternal Uncle   . Diabetes Son   . Heart disease Son      Social History   Tobacco Use  . Smoking status: Never Smoker  . Smokeless tobacco: Never Used  Substance Use Topics  . Alcohol use: No  . Drug use: No    Allergies as of 11/08/2019 - Review Complete 11/08/2019  Allergen Reaction Noted  . Captopril Other (See Comments) 06/27/2014  . Morphine and related Other (See Comments) 06/27/2014  . Penicillins Other (See Comments) 06/27/2014    Review of Systems:    All systems reviewed and negative except where noted in HPI.   Physical Exam:  Vital signs in last 24 hours: Temp:  [98.1 F (36.7 C)-99.9 F (37.7 C)] 98.7 F (37.1 C) (12/11 1115) Pulse Rate:  [40-85] 74 (12/11 1402) Resp:  [11-23] 11 (12/11 1330) BP: (115-176)/(62-83) 136/69 (12/11 1402) SpO2:  [88 %-100 %] 100 % (12/11 1402) Weight:  [101.6 kg] 101.6 kg (12/11 1143)   General:    Pleasant, cooperative in NAD Head:  Normocephalic and atraumatic. Eyes:   No icterus.   Conjunctiva pink. PERRLA. Ears:  Normal auditory acuity. Neck:  Supple; no masses or thyroidomegaly Lungs: Respirations even and unlabored. Lungs clear to auscultation bilaterally.   No wheezes, crackles, or rhonchi.  Heart:  Regular rate and rhythm;  Without murmur, clicks, rubs or gallops Abdomen:  Soft, nondistended, nontender. Normal bowel sounds. No appreciable masses or hepatomegaly.  No rebound or  guarding.  Neurologic:  Alert and oriented x3;  grossly normal neurologically. Skin:  Intact without significant lesions or rashes. Cervical Nodes:  No significant cervical adenopathy. Psych:  Alert and cooperative. Normal affect.  LAB RESULTS: Recent Labs    11/08/19 0327  WBC 7.7  HGB 3.9*  HCT 13.6*  PLT 178   BMET Recent Labs    11/08/19 0318 11/08/19 0327  NA 140 139  K 4.6 4.6  CL 113* 112*  CO2 15* 16*  GLUCOSE 116* 116*  BUN 34* 34*  CREATININE 1.81* 1.84*  CALCIUM 8.4* 8.5*   LFT Recent Labs    11/08/19 0327  PROT 6.9  ALBUMIN 3.4*  AST 28  ALT 34  ALKPHOS 77  BILITOT 1.0   PT/INR Recent Labs    11/08/19 0318  LABPROT 17.9*  INR 1.5*    STUDIES: DG Chest Portable 1 View  Result Date: 11/08/2019 CLINICAL DATA:  Shortness of breath EXAM: PORTABLE CHEST 1 VIEW COMPARISON:  December 06, 2018 FINDINGS: The heart size is enlarged. The patient is status post prior median sternotomy. There is no pneumothorax. There is vascular congestion. There are developing perihilar airspace opacities. There is no pneumothorax. IMPRESSION: Cardiomegaly with findings of developing congestive heart failure. Electronically Signed   By: Constance Holster M.D.   On: 11/08/2019 03:42   ECHOCARDIOGRAM COMPLETE  Result Date: 11/08/2019   ECHOCARDIOGRAM REPORT   Patient Name:   Jeffrey Mills [facility] Date of Exam: 11/08/2019 Medical Rec #:  [id number]         Height:       70.0 in Accession #:     [id number]        Weight:       224.0 lb Date of Birth:  1936-11-16          BSA:          2.19 m Patient Age:    84 years          BP:           138/76 mmHg Patient Gender: M                 HR:           66 bpm. Exam Location:  [facility] Procedure: 2D Echo, Cardiac Doppler and Color Doppler Indications:     CHF- acute diastolic 542.70  History:         Patient has prior history of Echocardiogram examinations, most                  recent 12/05/2018. CHF; Risk Factors:Diabetes and Hypertension.                  Mitral valve disorder, PVD.  Sonographer:     Sherrie Sport RDCS (AE) Referring Phys:  [id number] Athena Masse Diagnosing Phys: Bartholome Bill MD IMPRESSIONS  1. Left ventricular ejection fraction, by visual estimation, is 60 to 65%. The left ventricle has normal function. Left ventricular septal wall thickness was mildly increased. Mildly increased left ventricular posterior wall thickness. There is mildly increased left ventricular hypertrophy.  2. The left ventricle has no regional wall motion abnormalities.  3. Global right ventricle has normal systolic function.The right ventricular size is mildly enlarged. No increase in right ventricular wall thickness.  4. Left atrial size was mildly dilated.  5. Right atrial size was normal.  6. The mitral valve was not well visualized. Moderate mitral valve regurgitation.  7. The tricuspid valve is  not well visualized. Tricuspid valve regurgitation is trivial.  8. The aortic valve was not well visualized. Aortic valve regurgitation is trivial. Mild to moderate aortic valve sclerosis/calcification without any evidence of aortic stenosis.  9. The pulmonic valve was not well visualized. Pulmonic valve regurgitation is trivial. 10. The aortic root was not well visualized. 11. Moderately elevated pulmonary artery systolic pressure. 12. The atrial septum is grossly normal. FINDINGS  Left Ventricle: Left ventricular ejection fraction, by visual estimation, is 60 to 65%. The left  ventricle has normal function. The left ventricle has no regional wall motion abnormalities. Mildly increased left ventricular posterior wall thickness. There is mildly increased left ventricular hypertrophy. Right Ventricle: The right ventricular size is mildly enlarged. No increase in right ventricular wall thickness. Global RV systolic function is has normal systolic function. The tricuspid regurgitant velocity is 2.74 m/s, and with an assumed right atrial  pressure of 10 mmHg, the estimated right ventricular systolic pressure is moderately elevated at 40.0 mmHg. Left Atrium: Left atrial size was mildly dilated. Right Atrium: Right atrial size was normal in size Pericardium: There is no evidence of pericardial effusion. Mitral Valve: The mitral valve was not well visualized. Moderate mitral valve regurgitation. MV peak gradient, 18.5 mmHg. Tricuspid Valve: The tricuspid valve is not well visualized. Tricuspid valve regurgitation is trivial. Aortic Valve: The aortic valve was not well visualized. Aortic valve regurgitation is trivial. Mild to moderate aortic valve sclerosis/calcification is present, without any evidence of aortic stenosis. Aortic valve mean gradient measures 5.5 mmHg. Aortic  valve peak gradient measures 9.1 mmHg. Aortic valve area, by VTI measures 2.53 cm. Pulmonic Valve: The pulmonic valve was not well visualized. Pulmonic valve regurgitation is trivial. Pulmonic regurgitation is trivial. Aorta: The aortic root was not well visualized. IAS/Shunts: The atrial septum is grossly normal.  LEFT VENTRICLE PLAX 2D LVIDd:         4.42 cm  Diastology LVIDs:         2.92 cm  LV e' lateral:   6.96 cm/s LV PW:         1.08 cm  LV E/e' lateral: 27.3 LV IVS:        1.83 cm  LV e' medial:    5.33 cm/s LVOT diam:     2.00 cm  LV E/e' medial:  35.6 LV SV:         56 ml LV SV Index:   24.60 LVOT Area:     3.14 cm  RIGHT VENTRICLE RV Basal diam:  4.61 cm RV S prime:     8.05 cm/s TAPSE (M-mode): 2.8 cm LEFT  ATRIUM              Index       RIGHT ATRIUM           Index LA diam:        4.40 cm  2.01 cm/m  RA Area:     28.90 cm LA Vol (A2C):   107.0 ml 48.85 ml/m RA Volume:   106.00 ml 48.39 ml/m LA Vol (A4C):   74.5 ml  34.01 ml/m LA Biplane Vol: 92.9 ml  42.41 ml/m  AORTIC VALVE                    PULMONIC VALVE AV Area (Vmax):    2.11 cm     PV Vmax:        0.59 m/s AV Area (Vmean):   1.96 cm     PV  Peak grad:   1.4 mmHg AV Area (VTI):     2.53 cm     RVOT Peak grad: 2 mmHg AV Vmax:           150.50 cm/s AV Vmean:          106.150 cm/s AV VTI:            0.312 m AV Peak Grad:      9.1 mmHg AV Mean Grad:      5.5 mmHg LVOT Vmax:         101.00 cm/s LVOT Vmean:        66.300 cm/s LVOT VTI:          0.251 m LVOT/AV VTI ratio: 0.80  AORTA Ao Root diam: 3.10 cm MITRAL VALVE                         TRICUSPID VALVE MV Area (PHT): 2.29 cm              TR Peak grad:   30.0 mmHg MV Peak grad:  18.5 mmHg             TR Vmax:        309.00 cm/s MV Mean grad:  6.0 mmHg MV Vmax:       2.15 m/s              SHUNTS MV Vmean:      104.0 cm/s            Systemic VTI:  0.25 m MV VTI:        0.63 m                Systemic Diam: 2.00 cm MV PHT:        95.99 msec MV Decel Time: 331 msec MV E velocity: 190.00 cm/s 103 cm/s MV A velocity: 104.00 cm/s 70.3 cm/s MV E/A ratio:  1.83        1.5  Harold Hedge MD Electronically signed by Harold Hedge MD Signature Date/Time: 11/08/2019/2:37:58 PM    Final       Impression / Plan:   Jeffrey Mills is a 83 y.o. y/o male admitted with symptomatic anemia with hemoglobin of 3.9 with an elevated MCV.  Found to be in acute heart failure and respiratory failure.  Also has acute kidney injury.  Likely secondary to anemia.  He has a history of melena that has resolved.  On Coumadin and aspirin.  INR 1.5 in addition.  Plan 1.  Continue treatment for acute kidney injury, congestive heart failure and respiratory failure.  Once resolved can plan for EGD and possibly colonoscopy at the same  time.  In the meanwhile check B12 folate and iron studies [I have added them on]. 2.  If has further evidence of bleeding in the meanwhile obtain tagged RBC scan.   Thank you for involving me in the care of this patient.      LOS: 0 days   Wyline Mood, MD  11/08/2019, 2:45 PM

## 2019-11-08 NOTE — ED Triage Notes (Signed)
Pt reports increased SOB over the last 3 days. Pt takes Warfrin and was due to have labs on 12/10 but was weak and SOB. Pt reports tonight SOB worsened. 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

## 2019-11-08 NOTE — ED Provider Notes (Signed)
[facility] Emergency Department Provider Note  ____________________________________________  Time seen: Approximately 4:15 AM  I have reviewed the triage vital signs and the nursing notes.   HISTORY  Chief Complaint Shortness of Breath   HPI Jeffrey Mills is a 83 y.o. male with a history of coronary artery disease status post CABG, atrial fibrillation on Coumadin, diabetes, hypertension, hyperlipidemia, CHF with preserved EF, anemia who presents for evaluation of shortness of breath.  Patient went to see his primary care doctor little over 2 weeks ago for dyspnea on exertion.  He was found to be anemic with a hemoglobin of 7.5 down from 11.  He received a B12 shot and was started on iron.   Doctor recommended having a guaiac exam done in the lab and return in 2 weeks for reevaluation.  Patient comes in today because his symptoms got progressively worse.  He reports that now he can barely walk to and from the bathroom without becoming severely short of breath.  This evening while walking to the bathroom he felt very short of breath.  He sat down and was not able to catch his breath for a while which prompted the visit to the emergency room.  He endorses compliance with his Coumadin.  Endorses having several days of black stools although the last one was last week.  His stool is now normal.  He denies hematuria or hemoptysis, no chest pain, no shortness of breath at rest, no cough or fever.  No prior history of GI bleed.  Past Medical History:  Diagnosis Date  . Anemia   . Anxiety   . Arthritis   . Benign hypertension   . Cardiopulmonary arrest (HCC)   . CHF (congestive heart failure) (HCC)   . Coronary atherosclerosis of native coronary artery   . Depression   . Diabetes (HCC)   . Glaucoma   . Hyperlipidemia   . Mitral valve disorder   . Neuralgia   . PVD (peripheral vascular disease) [facility])     Patient Active Problem List   Diagnosis Date Noted  .  Sepsis (HCC) 12/03/2018    Past Surgical History:  Procedure Laterality Date  . CHOLECYSTECTOMY    . CORONARY ANGIOPLASTY WITH STENT PLACEMENT    . CORONARY ARTERY BYPASS GRAFT    . MITRAL VALVE ANNULOPLASTY    . TEE WITHOUT CARDIOVERSION N/A 12/05/2018   Procedure: TRANSESOPHAGEAL ECHOCARDIOGRAM (TEE);  Surgeon: Dalia Heading, MD;  Location: [facility] ORS;  Service: Cardiovascular;  Laterality: N/A;    Prior to Admission medications   Medication Sig Start Date End Date Taking? Authorizing Provider  acetaminophen (TYLENOL) 325 MG tablet Take 650 mg by mouth every 6 (six) hours as needed.    [provider]  ALPRAZolam Prudy Feeler) 0.5 MG tablet Take 0.5 mg by mouth at bedtime as needed for anxiety.    [provider]  aspirin 81 MG tablet Take 81 mg by mouth daily.    [provider]  brimonidine (ALPHAGAN) 0.2 % ophthalmic solution Place 1 drop into both eyes 2 (two) times daily.    [provider]  buPROPion (WELLBUTRIN XL) 150 MG 24 hr tablet Take 150 mg by mouth daily.    [provider]  clopidogrel (PLAVIX) 75 MG tablet Take 75 mg by mouth daily.    [provider]  escitalopram (LEXAPRO) 10 MG tablet Take 10 mg by mouth daily.     [provider]  ferrous gluconate (FERGON) 324 MG  tablet Take 324 mg by mouth daily with breakfast.    [provider]  gabapentin (NEURONTIN) 100 MG capsule Take 100 mg by mouth 3 (three) times daily.    [provider]  hyoscyamine (LEVSIN, ANASPAZ) 0.125 MG tablet Take 0.125 mg by mouth 3 (three) times daily as needed.    [provider]  insulin lispro protamine-lispro (HUMALOG 75/25 MIX) (75-25) 100 UNIT/ML SUSP injection Inject 38 Units into the skin 2 (two) times daily with a meal.     [provider]  insulin regular (NOVOLIN R) 100 units/mL injection Inject into the skin 3 (three) times daily before meals. Sliding Scale dose max of 10 units daily    [provider]  Insulin Syringe-Needle U-100 (INSULIN SYRINGE .3CC/31GX5/16") 31G X 5/16" 0.3 ML MISC by Does not apply route.    [provider]  lansoprazole (PREVACID) 30 MG capsule Take 30 mg by mouth daily at 12 noon.    [provider]  latanoprost (XALATAN) 0.005 % ophthalmic solution Place 1 drop into both eyes 2 (two) times daily.  05/28/14   [provider]  Liraglutide (VICTOZA [place]) Inject 1.8 mg into the skin daily.     [provider]  meclizine (ANTIVERT) 25 MG tablet Take 25 mg by mouth 3 (three) times daily as needed for dizziness.    [provider]  metFORMIN (GLUCOPHAGE) 500 MG tablet Take 1,000 mg by mouth 2 (two) times daily with a meal.     [provider]  metoprolol succinate (TOPROL-XL) 25 MG 24 hr tablet Take 25 mg by mouth 2 (two) times daily.     [provider]  pantoprazole (PROTONIX) 40 MG tablet Take 40 mg by mouth daily.    [provider]  sertraline (ZOLOFT) 50 MG tablet Take 50 mg by mouth daily.    [provider]  simvastatin (ZOCOR) 40 MG tablet Take 40 mg by mouth daily.    [provider]  triamcinolone cream (KENALOG) 0.1 % Apply 1 application topically 2 (two) times daily.    [provider]  vitamin B-12 (CYANOCOBALAMIN) 500 MCG tablet Take 500 mcg by mouth daily.    [provider]  warfarin (COUMADIN) 5 MG tablet Take 7.5 mg by mouth daily.    [provider]    Allergies Captopril, Morphine and related, and Penicillins  Family History  Problem Relation Age of Onset  . Heart disease Father   . ALS Father   . Hypertension Mother   . Breast cancer Mother   . Heart attack Paternal Uncle   . Diabetes Son   . Heart disease Son     Social History Social History   Tobacco Use  . Smoking status: Never Smoker  . Smokeless tobacco: Never Used  Substance Use Topics  . Alcohol use: No  . Drug use: No    Review of  Systems  Constitutional: Negative for fever. Eyes: Negative for visual changes. ENT: Negative for sore throat. Neck: No neck pain  Cardiovascular: Negative for chest pain. Respiratory: Negative for shortness of breath. + DOE Gastrointestinal: Negative for abdominal pain, vomiting or diarrhea. Genitourinary: Negative for dysuria. Musculoskeletal: Negative for back pain. Skin: Negative for rash. Neurological: Negative for headaches, weakness or numbness. Psych: No SI or HI  ____________________________________________   PHYSICAL EXAM:  VITAL SIGNS: ED Triage Vitals [11/08/19 0312]  Enc Vitals Group     BP 127/75     Pulse Rate 71  Resp (!) 22     Temp 99.9 F (37.7 C)     Temp Source Oral     SpO2 (!) 88 %     Weight      Height      Head Circumference      Peak Flow      Pain Score      Pain Loc      Pain Edu?      Excl. in [place]?     Constitutional: Alert and oriented. Well appearing and in no apparent distress. HEENT:      Head: Normocephalic and atraumatic.         Eyes: Conjunctivae are normal. Sclera is non-icteric.       Mouth/Throat: Mucous membranes are moist.       Neck: Supple with no signs of meningismus. Cardiovascular: Regular rate and rhythm. No murmurs, gallops, or rubs. 2+ symmetrical distal pulses are present in all extremities. No JVD. Respiratory: Normal respiratory effort. Lungs are clear to auscultation bilaterally. No wheezes, crackles, or rhonchi.  Patient initially hypoxic with ambulation however that resolved at rest Gastrointestinal: Soft, non tender, and non distended with positive bowel sounds. No rebound or guarding. Genitourinary: Rectal exam showing brown stool stool guaiac negative. Musculoskeletal: Nontender with normal range of motion in all extremities. No edema, cyanosis, or erythema of extremities. Neurologic: Normal speech and language. Face is symmetric. Moving all extremities. No gross focal neurologic deficits are  appreciated. Skin: Skin is warm, dry and intact. No rash noted. Psychiatric: Mood and affect are normal. Speech and behavior are normal.  ____________________________________________   LABS (all labs ordered are listed, but only abnormal results are displayed)  Labs Reviewed  PROTIME-INR - Abnormal; Notable for the following components:      Result Value   Prothrombin Time 17.9 (*)    INR 1.5 (*)    All other components within normal limits  CBC WITH DIFFERENTIAL/PLATELET - Abnormal; Notable for the following components:   RBC 1.24 (*)    Hemoglobin 3.9 (*)    HCT 13.6 (*)    MCV 109.7 (*)    MCHC 28.7 (*)    RDW 17.4 (*)    nRBC 0.8 (*)    All other components within normal limits  BASIC METABOLIC PANEL  COMPREHENSIVE METABOLIC PANEL  BRAIN NATRIURETIC PEPTIDE  CBC  TYPE AND SCREEN  PREPARE RBC (CROSSMATCH)  TROPONIN I (HIGH SENSITIVITY)   ____________________________________________  EKG  ED ECG REPORT I, Rudene Re, the attending physician, personally viewed and interpreted this ECG.  Normal sinus rhythm, rate of 81, normal intervals, normal axis, mild ST depression in lead II with no reciprocal changes. ____________________________________________  RADIOLOGY  none  ____________________________________________   PROCEDURES  Procedure(s) performed: None Procedures Critical Care performed: yes  CRITICAL CARE Performed by: Rudene Re  ?  Total critical care time: 35 min  Critical care time was exclusive of separately billable procedures and treating other patients.  Critical care was necessary to treat or prevent imminent or life-threatening deterioration.  Critical care was time spent personally by me on the following activities: development of treatment plan with patient and/or surrogate as well as nursing, discussions with consultants, evaluation of patient's response to treatment, examination of patient, obtaining history from patient  or surrogate, ordering and performing treatments and interventions, ordering and review of laboratory studies, ordering and review of radiographic studies, pulse oximetry and re-evaluation of patient's condition.  ____________________________________________   INITIAL IMPRESSION / ASSESSMENT AND PLAN /  ED COURSE   83 y.o. male with a history of coronary artery disease status post CABG, atrial fibrillation on Coumadin, diabetes, hypertension, hyperlipidemia, CHF with preserved EF, anemia who presents for evaluation of several weeks of progressively worsening dyspnea on exertion which became severe this evening.  Patient found to have worsening anemia 2 weeks ago at the PCPs office.  He was started on iron and given a B12 shot.  He is on Coumadin and had several episodes of black stool last week.  Currently his rectal exam shows brown stool guaiac negative.  With ambulation patient is hypoxic to 88% however that improves at rest to the upper 90s.  Normal blood pressure and no tachycardia.  Repeat labs here showing progression of patient's anemia with a hemoglobin of 3.9.  Most likely due to GI bleed however no active GI bleed at this time.  Since patient has a history of CABG and coronary artery disease will transfuse 1 unit of emergent release blood and type and cross for tomorrow.  Will admit patient to the hospitalist service.  He is EKG likely does not show any signs of acute ischemia.  His INR is normal at 1.5.  Will hold Coumadin.       As part of my medical decision making, I reviewed the following data within the electronic MEDICAL RECORD NUMBER Nursing notes reviewed and incorporated, Labs reviewed , EKG interpreted , Old EKG reviewed, Old chart reviewed, Discussed with admitting physician , Notes from prior ED visits and [place] Controlled Substance Database   Please note:  Patient was evaluated in Emergency Department today for the symptoms described in the history of present illness. Patient was  evaluated in the context of the global COVID-19 pandemic, which necessitated consideration that the patient might be at risk for infection with the SARS-CoV-2 virus that causes COVID-19. Institutional protocols and algorithms that pertain to the evaluation of patients at risk for COVID-19 are in a state of rapid change based on information released by regulatory bodies including the CDC and federal and state organizations. These policies and algorithms were followed during the patient's care in the ED.  Some ED evaluations and interventions may be delayed as a result of limited staffing during the pandemic.   ____________________________________________   FINAL CLINICAL IMPRESSION(S) / ED DIAGNOSES   Final diagnoses:  Severe anemia      NEW MEDICATIONS STARTED DURING THIS VISIT:  ED Discharge Orders    None       Note:  This document was prepared using Dragon voice recognition software and may include unintentional dictation errors.    Nita SickleVeronese, [place], MD 11/08/19 [phone]

## 2019-11-08 NOTE — ED Notes (Signed)
3rd transudion started @ 1100, patient tolerating well.

## 2019-11-08 NOTE — ED Notes (Signed)
Collected COVID swab

## 2019-11-08 NOTE — ED Notes (Signed)
Emergent Blood verified with Kandis Nab previtals collected, consent signed, Patient pale and A&Ox4, patient emergent blood started at 0438 -infusing at 178ml/hr.

## 2019-11-08 NOTE — ED Notes (Signed)
Patient started on 2nd unit of emergent blood verified with Gwen Her

## 2019-11-08 NOTE — ED Notes (Signed)
Attending called to clarify 2 different orders for PRBC,, text returned attending wants total 3 units.

## 2019-11-08 NOTE — Progress Notes (Addendum)
Progress Note    Jeffrey Mills  QQP:[id number] DOB: [birth year]  DOA: 11/08/2019 PCP: Rusty Aus, MD        Assessment/Plan:   Active Problems:   Sepsis ([place])   Severe anemia   Insulin dependent type 2 diabetes mellitus (HCC)   CKD (chronic kidney disease) stage 3, GFR 30-59 ml/min   History of melena   Acute on chronic diastolic CHF (congestive heart failure) (HCC)   Subtherapeutic international normalized ratio (INR)   Atrial fibrillation, chronic (HCC)   Chronic anticoagulation   Acute kidney injury superimposed on CKD ([place])   Melena   Body mass index is 32.14 kg/m.    Severe acute blood loss anemia: s/p transfusion with 3 units of packed red blood cells.  Monitor H&H  Recent GI bleeding/melena: Keep NPO and hydrate with low rate IV fluids.  Treat with IV Protonix.  Hold Coumadin.  Consulted gastroenterologist for further evaluation.  AKI on CKD stage IIIa with metabolic acidosis: IV fluids for hydration  Acute hypoxemic respiratory failure: Resolved.  He is tolerating room air.  Acute on chronic diastolic CHF: Improved with blood transfusion.  Hold IV Lasix for now.  2D echo on 11/08/2019 showed EF of 60 to 65%, mild LVH, moderate MR, moderately elevated pulmonary artery systolic pressure  Chronic atrial fibrillation: Coumadin on hold.  Continue metoprolol  IDDM: Hold Metformin and insulin.  Monitor glucose levels closely.  NovoLog as needed for hyperglycemia.  Hypertension: Continue metoprolol   Family Communication/Anticipated D/C date and plan/Code Status   DVT prophylaxis: SCDs Code Status: Full code Family Communication: Discussed with the patient  disposition Plan: Possible discharge to home in 2 to 3 days      Subjective:   No dizziness, shortness of breath, black stools, hematemesis or abdominal pain.  His breathing is okay.  Objective:    Vitals:   11/08/19 1330 11/08/19 1402 11/08/19 1522 11/08/19 1525  BP: 128/65 136/69  128/76 132/74  Pulse: 62 74 74 77  Resp: 11  16 18   Temp:   98.7 F (37.1 C) 98.3 F (36.8 C)  TempSrc:   Oral Oral  SpO2: 100% 100% 98% 98%  Weight:      Height:        Intake/Output Summary (Last 24 hours) at 11/08/2019 1610 Last data filed at 11/08/2019 1522 Gross per 24 hour  Intake 1260 ml  Output 2375 ml  Net -1115 ml   Filed Weights   11/08/19 1143  Weight: 101.6 kg    Exam:   GEN: NAD SKIN: No rash EYES: EOMI ENT: MMM CV: RRR PULM: CTA B ABD: soft, ND, NT, +BS CNS: AAO x 3, non focal EXT: No edema or tenderness   Data Reviewed:   I have personally reviewed following labs and imaging studies:  Labs: Labs show the following:   Basic Metabolic Panel: Recent Labs  Lab 11/08/19 0318 11/08/19 0327  NA 140 139  K 4.6 4.6  CL 113* 112*  CO2 15* 16*  GLUCOSE 116* 116*  BUN 34* 34*  CREATININE 1.81* 1.84*  CALCIUM 8.4* 8.5*   GFR Estimated Creatinine Clearance: 37.6 mL/min (A) (by C-G formula based on SCr of 1.84 mg/dL (H)). Liver Function Tests: Recent Labs  Lab 11/08/19 0327  AST 28  ALT 34  ALKPHOS 77  BILITOT 1.0  PROT 6.9  ALBUMIN 3.4*   No results for input(s): LIPASE, AMYLASE in the last 168 hours. No results for input(s): AMMONIA in  the last 168 hours. Coagulation profile Recent Labs  Lab 11/08/19 0318  INR 1.5*    CBC: Recent Labs  Lab 11/08/19 0327  WBC 7.7  NEUTROABS 5.6  HGB 3.9*  HCT 13.6*  MCV 109.7*  PLT 178   Cardiac Enzymes: No results for input(s): CKTOTAL, CKMB, CKMBINDEX, TROPONINI in the last 168 hours. BNP (last 3 results) No results for input(s): PROBNP in the last 8760 hours. CBG: Recent Labs  Lab 11/08/19 0826 11/08/19 1001 11/08/19 1224  GLUCAP 53* 103* 103*   D-Dimer: No results for input(s): DDIMER in the last 72 hours. Hgb A1c: No results for input(s): HGBA1C in the last 72 hours. Lipid Profile: No results for input(s): CHOL, HDL, LDLCALC, TRIG, CHOLHDL, LDLDIRECT in the last 72  hours. Thyroid function studies: No results for input(s): TSH, T4TOTAL, T3FREE, THYROIDAB in the last 72 hours.  Invalid input(s): FREET3 Anemia work up: No results for input(s): VITAMINB12, FOLATE, FERRITIN, TIBC, IRON, RETICCTPCT in the last 72 hours. Sepsis Labs: Recent Labs  Lab 11/08/19 0327  WBC 7.7    Microbiology Recent Results (from the past 240 hour(s))  SARS CORONAVIRUS 2 (TAT 6-24 HRS) Nasopharyngeal Nasopharyngeal Swab     Status: None   Collection Time: 11/08/19  5:32 AM   Specimen: Nasopharyngeal Swab  Result Value Ref Range Status   SARS Coronavirus 2 NEGATIVE NEGATIVE Final    Comment: (NOTE) SARS-CoV-2 target nucleic acids are NOT DETECTED. The SARS-CoV-2 RNA is generally detectable in upper and lower respiratory specimens during the acute phase of infection. Negative results do not preclude SARS-CoV-2 infection, do not rule out co-infections with other pathogens, and should not be used as the sole basis for treatment or other patient management decisions. Negative results must be combined with clinical observations, patient history, and epidemiological information. The expected result is Negative. Fact Sheet for Patients: HairSlick.no Fact Sheet for Healthcare Providers: quierodirigir.com This test is not yet approved or cleared by the [place] FDA and  has been authorized for detection and/or diagnosis of SARS-CoV-2 by FDA under an Emergency Use Authorization (EUA). This EUA will remain  in effect (meaning this test can be used) for the duration of the COVID-19 declaration under Section 56 4(b)(1) of the Act, 21 U.S.C. section 360bbb-3(b)(1), unless the authorization is terminated or revoked sooner. Performed at [facility], [address] [place], [place] [address]     Procedures and diagnostic studies:  DG Chest Portable 1 View  Result Date: 11/08/2019 CLINICAL DATA:  Shortness  of breath EXAM: PORTABLE CHEST 1 VIEW COMPARISON:  December 06, 2018 FINDINGS: The heart size is enlarged. The patient is status post prior median sternotomy. There is no pneumothorax. There is vascular congestion. There are developing perihilar airspace opacities. There is no pneumothorax. IMPRESSION: Cardiomegaly with findings of developing congestive heart failure. Electronically Signed   By: Katherine Mantle M.D.   On: 11/08/2019 03:42   ECHOCARDIOGRAM COMPLETE  Result Date: 11/08/2019   ECHOCARDIOGRAM REPORT   Patient Name:   VARTAN KERINS Date of Exam: 11/08/2019 Medical Rec #:  [id number]         Height:       70.0 in Accession #:    [id number]        Weight:       224.0 lb Date of Birth:  November 19, 1936          BSA:          2.19 m Patient Age:  83 years          BP:           138/76 mmHg Patient Gender: M                 HR:           66 bpm. Exam Location:  [facility] Procedure: 2D Echo, Cardiac Doppler and Color Doppler Indications:     CHF- acute diastolic 428.31  History:         Patient has prior history of Echocardiogram examinations, most                  recent 12/05/2018. CHF; Risk Factors:Diabetes and Hypertension.                  Mitral valve disorder, PVD.  Sonographer:     Cristela Blue RDCS (AE) Referring Phys:  [id number] Andris Baumann Diagnosing Phys: Harold Hedge MD IMPRESSIONS  1. Left ventricular ejection fraction, by visual estimation, is 60 to 65%. The left ventricle has normal function. Left ventricular septal wall thickness was mildly increased. Mildly increased left ventricular posterior wall thickness. There is mildly increased left ventricular hypertrophy.  2. The left ventricle has no regional wall motion abnormalities.  3. Global right ventricle has normal systolic function.The right ventricular size is mildly enlarged. No increase in right ventricular wall thickness.  4. Left atrial size was mildly dilated.  5. Right atrial size was normal.  6. The mitral valve was not well  visualized. Moderate mitral valve regurgitation.  7. The tricuspid valve is not well visualized. Tricuspid valve regurgitation is trivial.  8. The aortic valve was not well visualized. Aortic valve regurgitation is trivial. Mild to moderate aortic valve sclerosis/calcification without any evidence of aortic stenosis.  9. The pulmonic valve was not well visualized. Pulmonic valve regurgitation is trivial. 10. The aortic root was not well visualized. 11. Moderately elevated pulmonary artery systolic pressure. 12. The atrial septum is grossly normal. FINDINGS  Left Ventricle: Left ventricular ejection fraction, by visual estimation, is 60 to 65%. The left ventricle has normal function. The left ventricle has no regional wall motion abnormalities. Mildly increased left ventricular posterior wall thickness. There is mildly increased left ventricular hypertrophy. Right Ventricle: The right ventricular size is mildly enlarged. No increase in right ventricular wall thickness. Global RV systolic function is has normal systolic function. The tricuspid regurgitant velocity is 2.74 m/s, and with an assumed right atrial  pressure of 10 mmHg, the estimated right ventricular systolic pressure is moderately elevated at 40.0 mmHg. Left Atrium: Left atrial size was mildly dilated. Right Atrium: Right atrial size was normal in size Pericardium: There is no evidence of pericardial effusion. Mitral Valve: The mitral valve was not well visualized. Moderate mitral valve regurgitation. MV peak gradient, 18.5 mmHg. Tricuspid Valve: The tricuspid valve is not well visualized. Tricuspid valve regurgitation is trivial. Aortic Valve: The aortic valve was not well visualized. Aortic valve regurgitation is trivial. Mild to moderate aortic valve sclerosis/calcification is present, without any evidence of aortic stenosis. Aortic valve mean gradient measures 5.5 mmHg. Aortic  valve peak gradient measures 9.1 mmHg. Aortic valve area, by VTI measures  2.53 cm. Pulmonic Valve: The pulmonic valve was not well visualized. Pulmonic valve regurgitation is trivial. Pulmonic regurgitation is trivial. Aorta: The aortic root was not well visualized. IAS/Shunts: The atrial septum is grossly normal.  LEFT VENTRICLE PLAX 2D LVIDd:         4.42 cm  Diastology LVIDs:         2.92 cm  LV e' lateral:   6.96 cm/s LV PW:         1.08 cm  LV E/e' lateral: 27.3 LV IVS:        1.83 cm  LV e' medial:    5.33 cm/s LVOT diam:     2.00 cm  LV E/e' medial:  35.6 LV SV:         56 ml LV SV Index:   24.60 LVOT Area:     3.14 cm  RIGHT VENTRICLE RV Basal diam:  4.61 cm RV S prime:     8.05 cm/s TAPSE (M-mode): 2.8 cm LEFT ATRIUM              Index       RIGHT ATRIUM           Index LA diam:        4.40 cm  2.01 cm/m  RA Area:     28.90 cm LA Vol (A2C):   107.0 ml 48.85 ml/m RA Volume:   106.00 ml 48.39 ml/m LA Vol (A4C):   74.5 ml  34.01 ml/m LA Biplane Vol: 92.9 ml  42.41 ml/m  AORTIC VALVE                    PULMONIC VALVE AV Area (Vmax):    2.11 cm     PV Vmax:        0.59 m/s AV Area (Vmean):   1.96 cm     PV Peak grad:   1.4 mmHg AV Area (VTI):     2.53 cm     RVOT Peak grad: 2 mmHg AV Vmax:           150.50 cm/s AV Vmean:          106.150 cm/s AV VTI:            0.312 m AV Peak Grad:      9.1 mmHg AV Mean Grad:      5.5 mmHg LVOT Vmax:         101.00 cm/s LVOT Vmean:        66.300 cm/s LVOT VTI:          0.251 m LVOT/AV VTI ratio: 0.80  AORTA Ao Root diam: 3.10 cm MITRAL VALVE                         TRICUSPID VALVE MV Area (PHT): 2.29 cm              TR Peak grad:   30.0 mmHg MV Peak grad:  18.5 mmHg             TR Vmax:        309.00 cm/s MV Mean grad:  6.0 mmHg MV Vmax:       2.15 m/s              SHUNTS MV Vmean:      104.0 cm/s            Systemic VTI:  0.25 m MV VTI:        0.63 m                Systemic Diam: 2.00 cm MV PHT:        95.99 msec MV Decel Time: 331 msec MV E velocity: 190.00 cm/s 103 cm/s MV A velocity: 104.00 cm/s 70.3  cm/s MV E/A ratio:  1.83         1.5  Harold HedgeKenneth Fath MD Electronically signed by Harold HedgeKenneth Fath MD Signature Date/Time: 11/08/2019/2:37:58 PM    Final     Medications:   . sodium chloride   Intravenous Once  . brimonidine  1 drop Both Eyes BID  . buPROPion  150 mg Oral Daily  . escitalopram  10 mg Oral Daily  . ferrous gluconate  324 mg Oral Q breakfast  . insulin aspart  0-15 Units Subcutaneous Q4H  . latanoprost  1 drop Both Eyes BID  . metoprolol succinate  25 mg Oral BID  . pantoprazole (PROTONIX) IV  40 mg Intravenous Q12H  . simvastatin  40 mg Oral q1800  . vitamin B-12  500 mcg Oral Daily   Continuous Infusions: . sodium chloride    . dextrose 5 % and 0.9% NaCl 75 mL/hr at 11/08/19 1413     LOS: 0 days   Amelie Caracci  [facility]   *Please refer to [email or web address], password [email or web address] to get updated schedule on who will round on this patient, as hospitalists switch teams weekly. If 7PM-7AM, please contact night-coverage at [email or web address], password [email or web address] for any overnight needs.  11/08/2019, 4:10 PM

## 2019-11-08 NOTE — H&P (Signed)
History and Physical    Jeffrey Mills:[id number] DOB: 04-Sep-1936 DOA: 11/08/2019  PCP: Danella Penton, MD  Patient coming from: home I have personally briefly reviewed patient's old medical records in [facility]  Chief Complaint: shortness of breath  HPI: Jeffrey Mills is a 83 y.o. male with medical history significant for diastolic heart failure, CAD status post CABG, atrial fibrillation on Coumadin, insulin-dependent type 2 diabetes and hypertension who presents to the emergency room with a 2-week history of shortness of breath with activity.  Went to see his primary care provider who found him to have a hemoglobin of 7.5 and ordered vitamin B12.  He stated he was taking his vitamin B12 but his symptoms persisted or worsened to where hardly get around in the house.  He denied lower extremity edema and denied orthopnea or PND.  Denied chest pain.  Denied nausea or vomiting.  He did admit to having several loose bowel movements with very dark stool she attributed to his vitamins.  He said it eventually resolved his stool is now of normal consistency and color.  Has never had upper or lower endoscopy.   ED Course: In the emergency room he was awake and alert and had stable vitals with blood pressure of 131/65 and heart rate of 76, though he was mildly hypoxic with an O2 sat of 88% room air.  Blood work was significant for hemoglobin of 3.9.  Creatinine was 1.81.  Potassium normal bicarb low at 16.  INR was subtherapeutic at 1.5.  Troponin  12.  BNP 467.  Chest x-ray showed cardiomegaly with developing congestive heart failure.  Was started on unit of blood and hospitalization requested.  Review of Systems: As per HPI otherwise 10 point review of systems negative.  Review of Systems  Constitutional: Positive for malaise/fatigue. Negative for chills and fever.  Eyes: Negative for blurred vision and double vision.  Respiratory: Positive for shortness of breath. Negative for cough,  sputum production and wheezing.   Cardiovascular: Negative for chest pain, palpitations, orthopnea, leg swelling and PND.  Gastrointestinal: Positive for diarrhea and melena. Negative for abdominal pain, blood in stool, constipation, heartburn, nausea and vomiting.  Genitourinary: Negative for dysuria and hematuria.  Musculoskeletal: Negative for back pain and joint pain.  Skin: Negative for itching and rash.  Neurological: Positive for weakness. Negative for dizziness and focal weakness.  Endo/Heme/Allergies: Does not bruise/bleed easily.     Past Medical History:  Diagnosis Date  . Anemia   . Anxiety   . Arthritis   . Benign hypertension   . Cardiopulmonary arrest (HCC)   . CHF (congestive heart failure) (HCC)   . Coronary atherosclerosis of native coronary artery   . Depression   . Diabetes (HCC)   . Glaucoma   . Hyperlipidemia   . Mitral valve disorder   . Neuralgia   . PVD (peripheral vascular disease) (HCC)     Past Surgical History:  Procedure Laterality Date  . CHOLECYSTECTOMY    . CORONARY ANGIOPLASTY WITH STENT PLACEMENT    . CORONARY ARTERY BYPASS GRAFT    . MITRAL VALVE ANNULOPLASTY    . TEE WITHOUT CARDIOVERSION N/A 12/05/2018   Procedure: TRANSESOPHAGEAL ECHOCARDIOGRAM (TEE);  Surgeon: Dalia Heading, MD;  Location: [facility] ORS;  Service: Cardiovascular;  Laterality: N/A;     reports that he has never smoked. He has never used smokeless tobacco. He reports that he does not drink alcohol or use drugs.  Allergies  Allergen Reactions  .  Captopril   . Morphine And Related   . Penicillins     Family History  Problem Relation Age of Onset  . Heart disease Father   . ALS Father   . Hypertension Mother   . Breast cancer Mother   . Heart attack Paternal Uncle   . Diabetes Son   . Heart disease Son      Prior to Admission medications   Medication Sig Start Date End Date Taking? Authorizing Provider  ALPRAZolam Prudy Feeler(XANAX) 0.5 MG tablet Take 0.5 mg by mouth at  bedtime as needed for anxiety.    [provider]  aspirin 81 MG tablet Take 81 mg by mouth daily.    [provider]  brimonidine (ALPHAGAN) 0.2 % ophthalmic solution Place 1 drop into both eyes 2 (two) times daily.    [provider]  escitalopram (LEXAPRO) 10 MG tablet Take 10 mg by mouth daily.     [provider]  ferrous gluconate (FERGON) 324 MG tablet Take 324 mg by mouth daily with breakfast.    [provider]  gabapentin (NEURONTIN) 100 MG capsule Take 100 mg by mouth 3 (three) times daily.    [provider]  insulin lispro protamine-lispro (HUMALOG 75/25 MIX) (75-25) 100 UNIT/ML SUSP injection Inject 38 Units into the skin 2 (two) times daily with a meal.     [provider]  insulin regular (NOVOLIN R) 100 units/mL injection Inject into the skin 3 (three) times daily before meals. Sliding Scale dose max of 10 units daily    [provider]  Insulin Syringe-Needle U-100 (INSULIN SYRINGE .3CC/31GX5/16") 31G X 5/16" 0.3 ML MISC by Does not apply route.    [provider]  lansoprazole (PREVACID) 30 MG capsule Take 30 mg by mouth daily at 12 noon.    [provider]  latanoprost (XALATAN) 0.005 % ophthalmic solution Place 1 drop into both eyes 2 (two) times daily.  05/28/14   [provider]  meclizine (ANTIVERT) 25 MG tablet Take 25 mg by mouth 3 (three) times daily as needed for dizziness.    [provider]  metFORMIN (GLUCOPHAGE) 500 MG tablet Take 1,000 mg by mouth 2 (two) times daily with a meal.     [provider]  metoprolol succinate (TOPROL-XL) 25 MG 24 hr tablet Take 25 mg by mouth 2 (two) times daily.     [provider]  pantoprazole (PROTONIX) 40 MG tablet Take 40 mg by mouth daily.    [provider]  simvastatin (ZOCOR) 40 MG tablet Take 40 mg by mouth daily.    [provider]  vitamin B-12 (CYANOCOBALAMIN) 500 MCG tablet Take 500  mcg by mouth daily.    [provider]  warfarin (COUMADIN) 5 MG tablet Take 7.5 mg by mouth daily.    [provider]    Physical Exam: Vitals:   11/08/19 0312 11/08/19 0431 11/08/19 0455  BP: 127/75 119/73 133/68  Pulse: 71 76 75  Resp: (!) 22 16 18   Temp: 99.9 F (37.7 C) 98.1 F (36.7 C) 98.3 F (36.8 C)  TempSrc: Oral Oral Oral  SpO2: (!) 88% 100% 100%    Constitutional: NAD, calm, comfortable Vitals:   11/08/19 0312 11/08/19 0431 11/08/19 0455  BP: 127/75 119/73 133/68  Pulse: 71 76 75  Resp: (!) 22 16 18   Temp: 99.9 F (37.7 C) 98.1 F (36.7 C) 98.3 F (36.8 C)  TempSrc: Oral Oral Oral  SpO2: (!) 88% 100%  100%   Eyes: PERRL, lids and conjunctivae normal ENMT: Mucous membranes are moist pale. Posterior pharynx clear of any exudate or lesions.Normal dentition.  Neck: normal, supple, no masses, no thyromegaly Respiratory: clear to auscultation bilaterally, no wheezing, no crackles. Normal respiratory effort. No accessory muscle use.  Cardiovascular: Regular rate and rhythm, no murmurs / rubs / gallops. No extremity edema. 2+ pedal pulses. No carotid bruits.  Abdomen: no tenderness, no masses palpated. No hepatosplenomegaly. Bowel sounds positive.  Musculoskeletal: no clubbing / cyanosis. No joint deformity upper and lower extremities. Good ROM, no contractures. Normal muscle tone.  Skin: no rashes, lesions, ulcers. No induration Neurologic: CN 2-12 grossly intact. Sensation intact, DTR normal. Strength 5/5 in all 4.  Psychiatric: Normal judgment and insight. Alert and oriented x 3. Normal mood.    Labs on Admission: CBC: Recent Labs  Lab 11/08/19 0327  WBC 7.7  NEUTROABS 5.6  HGB 3.9*  HCT 13.6*  MCV 109.7*  PLT 627   Basic Metabolic Panel: Recent Labs  Lab 11/08/19 0318 11/08/19 0327  NA 140 139  K 4.6 4.6  CL 113* 112*  CO2 15* 16*  GLUCOSE 116* 116*  BUN 34* 34*  CREATININE 1.81* 1.84*  CALCIUM 8.4* 8.5*   GFR: CrCl cannot  be calculated (Unknown ideal weight.). Liver Function Tests: Recent Labs  Lab 11/08/19 0327  AST 28  ALT 34  ALKPHOS 77  BILITOT 1.0  PROT 6.9  ALBUMIN 3.4*   No results for input(s): LIPASE, AMYLASE in the last 168 hours. No results for input(s): AMMONIA in the last 168 hours. Coagulation Profile: Recent Labs  Lab 11/08/19 0318  INR 1.5*   Cardiac Enzymes: No results for input(s): CKTOTAL, CKMB, CKMBINDEX, TROPONINI in the last 168 hours. BNP (last 3 results) No results for input(s): PROBNP in the last 8760 hours. HbA1C: No results for input(s): HGBA1C in the last 72 hours. CBG: No results for input(s): GLUCAP in the last 168 hours. Lipid Profile: No results for input(s): CHOL, HDL, LDLCALC, TRIG, CHOLHDL, LDLDIRECT in the last 72 hours. Thyroid Function Tests: No results for input(s): TSH, T4TOTAL, FREET4, T3FREE, THYROIDAB in the last 72 hours. Anemia Panel: No results for input(s): VITAMINB12, FOLATE, FERRITIN, TIBC, IRON, RETICCTPCT in the last 72 hours. Urine analysis:    Component Value Date/Time   COLORURINE YELLOW (A) 12/03/2018 0309   APPEARANCEUR CLEAR (A) 12/03/2018 0309   LABSPEC 1.042 (H) 12/03/2018 0309   PHURINE 5.0 12/03/2018 0309   GLUCOSEU 150 (A) 12/03/2018 0309   HGBUR NEGATIVE 12/03/2018 0309   BILIRUBINUR NEGATIVE 12/03/2018 0309   KETONESUR NEGATIVE 12/03/2018 0309   PROTEINUR NEGATIVE 12/03/2018 0309   NITRITE NEGATIVE 12/03/2018 0309   LEUKOCYTESUR NEGATIVE 12/03/2018 0309    Radiological Exams on Admission: DG Chest Portable 1 View  Result Date: 11/08/2019 CLINICAL DATA:  Shortness of breath EXAM: PORTABLE CHEST 1 VIEW COMPARISON:  December 06, 2018 FINDINGS: The heart size is enlarged. The patient is status post prior median sternotomy. There is no pneumothorax. There is vascular congestion. There are developing perihilar airspace opacities. There is no pneumothorax. IMPRESSION: Cardiomegaly with findings of developing congestive heart  failure. Electronically Signed   By: Constance Holster M.D.   On: 11/08/2019 03:42    Assessment/Plan Active Problems:   Symptomatic anemia --With hemoglobin of 3.9 with symptoms of heart failure exacerbation --Cross and transfuse 3 units packed red blood cells --Lasix after second unit --CBC posttransfusion --GI consult Acute on chronic diastolic heart failure --Precipitated by severe  anemia --Should Improve with blood transfusions but monitor for volume overload --IV lasix between units 2-3 --daily weights and input output monitoring.  Action --Echocardiogram Acute hypoxic respiratory failure --Secondary to the above --O2 sat 88% in the emergency room --Continue supplemental oxygen to keep sats over 94  insulin dependent type 2 diabetes mellitus (HCC) --Home insulin and oral hypoglycemic agent --Plan mental insulin coverage for now Acute kidney injury superimposed CKD (chronic kidney disease) stage 3, GFR 30-59 ml/min --Likely secondary to dehydration from recent diarrhea --IV hydration Suspect upper GI bleed --Patient had melena stool past 2 weeks which resolved --IV Protonix --Keep n.p.o. pending GI consult --Holding both Coumadin and antiplatelets   Subtherapeutic international normalized ratio (INR) --Coumadin on hold due to suspected GI bleed   Atrial fibrillation, chronic (HCC) --Continue rate control agents.  --Hold Coumadin   DVT prophylaxis: SCDCode Status: Full) Consults called:Dr Dian Situ MD [facility]   If 7PM-7AM, please contact night-coverage [email or web address] Password [facility]  11/08/2019, 5:26 AM

## 2019-11-09 ENCOUNTER — Other Ambulatory Visit: Payer: Self-pay

## 2019-11-09 DIAGNOSIS — K921 Melena: Secondary | ICD-10-CM

## 2019-11-09 DIAGNOSIS — D62 Acute posthemorrhagic anemia: Principal | ICD-10-CM

## 2019-11-09 LAB — HEMOGLOBIN A1C
Hgb A1c MFr Bld: 6.7 % — ABNORMAL HIGH (ref 4.8–5.6)
Mean Plasma Glucose: 145.59 mg/dL

## 2019-11-09 LAB — CBC
HCT: 23.2 % — ABNORMAL LOW (ref 39.0–52.0)
Hemoglobin: 7.4 g/dL — ABNORMAL LOW (ref 13.0–17.0)
MCH: 30.8 pg (ref 26.0–34.0)
MCHC: 31.9 g/dL (ref 30.0–36.0)
MCV: 96.7 fL (ref 80.0–100.0)
Platelets: 142 10*3/uL — ABNORMAL LOW (ref 150–400)
RBC: 2.4 MIL/uL — ABNORMAL LOW (ref 4.22–5.81)
RDW: 18.6 % — ABNORMAL HIGH (ref 11.5–15.5)
WBC: 6.2 10*3/uL (ref 4.0–10.5)
nRBC: 0.3 % — ABNORMAL HIGH (ref 0.0–0.2)

## 2019-11-09 LAB — GLUCOSE, CAPILLARY
Glucose-Capillary: 105 mg/dL — ABNORMAL HIGH (ref 70–99)
Glucose-Capillary: 105 mg/dL — ABNORMAL HIGH (ref 70–99)
Glucose-Capillary: 127 mg/dL — ABNORMAL HIGH (ref 70–99)
Glucose-Capillary: 161 mg/dL — ABNORMAL HIGH (ref 70–99)
Glucose-Capillary: 173 mg/dL — ABNORMAL HIGH (ref 70–99)
Glucose-Capillary: 190 mg/dL — ABNORMAL HIGH (ref 70–99)

## 2019-11-09 LAB — BASIC METABOLIC PANEL
Anion gap: 8 (ref 5–15)
BUN: 27 mg/dL — ABNORMAL HIGH (ref 8–23)
CO2: 20 mmol/L — ABNORMAL LOW (ref 22–32)
Calcium: 8.3 mg/dL — ABNORMAL LOW (ref 8.9–10.3)
Chloride: 114 mmol/L — ABNORMAL HIGH (ref 98–111)
Creatinine, Ser: 1.42 mg/dL — ABNORMAL HIGH (ref 0.61–1.24)
GFR calc Af Amer: 53 mL/min — ABNORMAL LOW (ref >60)
GFR calc non Af Amer: 46 mL/min — ABNORMAL LOW (ref >60)
Glucose, Bld: 112 mg/dL — ABNORMAL HIGH (ref 70–99)
Potassium: 4.5 mmol/L (ref 3.5–5.1)
Sodium: 142 mmol/L (ref 135–145)

## 2019-11-09 LAB — VITAMIN B12: Vitamin B-12: 566 pg/mL (ref 180–914)

## 2019-11-09 MED ORDER — SODIUM CHLORIDE 0.9 % IV SOLN
200.0000 mg | INTRAVENOUS | Status: AC
  Administered 2019-11-09 – 2019-11-10 (×2): 200 mg via INTRAVENOUS
  Filled 2019-11-09 (×2): qty 10

## 2019-11-09 MED ORDER — PEG 3350-KCL-NA BICARB-NACL 420 G PO SOLR
4000.0000 mL | Freq: Once | ORAL | Status: AC
  Administered 2019-11-10: 4000 mL via ORAL
  Filled 2019-11-09: qty 4000

## 2019-11-09 NOTE — Plan of Care (Signed)

## 2019-11-09 NOTE — Progress Notes (Signed)
Jeffrey BouillonVarnita Kiante Petrovich, MD [address], [address], [place], [place], [id number] [address], [address], [place], [place], [address] Phone: [phone]  Fax: [phone]   Subjective:  Patient laying in bed comfortably.  On room air at this time.  However, does appear slightly short of breath.  Reports a brown bowel movement today  Objective: Exam: Vital signs in last 24 hours: Vitals:   11/08/19 1525 11/08/19 1705 11/08/19 2012 11/08/19 2317  BP: 132/74 (!) 154/66 (!) 162/80 130/75  Pulse: 77 71 77 73  Resp: 18  17 17   Temp: 98.3 F (36.8 C)  98.7 F (37.1 C) 98.6 F (37 C)  TempSrc: Oral     SpO2: 98% 100% 99% 97%  Weight:      Height:       Weight change:   Intake/Output Summary (Last 24 hours) at 11/09/2019 1556 Last data filed at 11/09/2019 1033 Gross per 24 hour  Intake -  Output 301 ml  Net -301 ml    General: No acute distress, AAO x3 Abd: Soft, NT/ND, No HSM Skin: Warm, no rashes Neck: Supple, Trachea midline   Lab Results: Lab Results  Component Value Date   WBC 6.2 11/09/2019   HGB 7.4 (L) 11/09/2019   HCT 23.2 (L) 11/09/2019   MCV 96.7 11/09/2019   PLT 142 (L) 11/09/2019   Micro Results: Recent Results (from the past 240 hour(s))  SARS CORONAVIRUS 2 (TAT 6-24 HRS) Nasopharyngeal Nasopharyngeal Swab     Status: None   Collection Time: 11/08/19  5:32 AM   Specimen: Nasopharyngeal Swab  Result Value Ref Range Status   SARS Coronavirus 2 NEGATIVE NEGATIVE Final    Comment: (NOTE) SARS-CoV-2 target nucleic acids are NOT DETECTED. The SARS-CoV-2 RNA is generally detectable in upper and lower respiratory specimens during the acute phase of infection. Negative results do not preclude SARS-CoV-2 infection, do not rule out co-infections with other pathogens, and should not be used as the sole basis for treatment or other patient management decisions. Negative results must be combined with clinical observations, patient history, and epidemiological  information. The expected result is Negative. Fact Sheet for Patients: HairSlick.nohttps://www.fda.gov/media/138098/download Fact Sheet for Healthcare Providers: quierodirigir.comhttps://www.fda.gov/media/138095/download This test is not yet approved or cleared by the [place] FDA and  has been authorized for detection and/or diagnosis of SARS-CoV-2 by FDA under an Emergency Use Authorization (EUA). This EUA will remain  in effect (meaning this test can be used) for the duration of the COVID-19 declaration under Section 56 4(b)(1) of the Act, 21 U.S.C. section 360bbb-3(b)(1), unless the authorization is terminated or revoked sooner. Performed at [facility], [address] [place], [place] [address]    Studies/Results: DG Chest Portable 1 View  Result Date: 11/08/2019 CLINICAL DATA:  Shortness of breath EXAM: PORTABLE CHEST 1 VIEW COMPARISON:  December 06, 2018 FINDINGS: The heart size is enlarged. The patient is status post prior median sternotomy. There is no pneumothorax. There is vascular congestion. There are developing perihilar airspace opacities. There is no pneumothorax. IMPRESSION: Cardiomegaly with findings of developing congestive heart failure. Electronically Signed   By: Katherine Mantlehristopher  Green M.D.   On: 11/08/2019 03:42   ECHOCARDIOGRAM COMPLETE  Result Date: 11/08/2019   ECHOCARDIOGRAM REPORT   Patient Name:   Jeffrey Mills Date of Exam: 11/08/2019 Medical Rec #:  [id number]         Height:       70.0 in Accession #:    [id number]  Weight:       224.0 lb Date of Birth:  11/09/1936          BSA:          2.19 m Patient Age:    83 years          BP:           138/76 mmHg Patient Gender: M                 HR:           66 bpm. Exam Location:  [facility] Procedure: 2D Echo, Cardiac Doppler and Color Doppler Indications:     CHF- acute diastolic 428.31  History:         Patient has prior history of Echocardiogram examinations, most                  recent 12/05/2018. CHF; Risk Factors:Diabetes and  Hypertension.                  Mitral valve disorder, PVD.  Sonographer:     Cristela Blue RDCS (AE) Referring Phys:  [id number] Andris Baumann Diagnosing Phys: Harold Hedge MD IMPRESSIONS  1. Left ventricular ejection fraction, by visual estimation, is 60 to 65%. The left ventricle has normal function. Left ventricular septal wall thickness was mildly increased. Mildly increased left ventricular posterior wall thickness. There is mildly increased left ventricular hypertrophy.  2. The left ventricle has no regional wall motion abnormalities.  3. Global right ventricle has normal systolic function.The right ventricular size is mildly enlarged. No increase in right ventricular wall thickness.  4. Left atrial size was mildly dilated.  5. Right atrial size was normal.  6. The mitral valve was not well visualized. Moderate mitral valve regurgitation.  7. The tricuspid valve is not well visualized. Tricuspid valve regurgitation is trivial.  8. The aortic valve was not well visualized. Aortic valve regurgitation is trivial. Mild to moderate aortic valve sclerosis/calcification without any evidence of aortic stenosis.  9. The pulmonic valve was not well visualized. Pulmonic valve regurgitation is trivial. 10. The aortic root was not well visualized. 11. Moderately elevated pulmonary artery systolic pressure. 12. The atrial septum is grossly normal. FINDINGS  Left Ventricle: Left ventricular ejection fraction, by visual estimation, is 60 to 65%. The left ventricle has normal function. The left ventricle has no regional wall motion abnormalities. Mildly increased left ventricular posterior wall thickness. There is mildly increased left ventricular hypertrophy. Right Ventricle: The right ventricular size is mildly enlarged. No increase in right ventricular wall thickness. Global RV systolic function is has normal systolic function. The tricuspid regurgitant velocity is 2.74 m/s, and with an assumed right atrial  pressure of 10 mmHg,  the estimated right ventricular systolic pressure is moderately elevated at 40.0 mmHg. Left Atrium: Left atrial size was mildly dilated. Right Atrium: Right atrial size was normal in size Pericardium: There is no evidence of pericardial effusion. Mitral Valve: The mitral valve was not well visualized. Moderate mitral valve regurgitation. MV peak gradient, 18.5 mmHg. Tricuspid Valve: The tricuspid valve is not well visualized. Tricuspid valve regurgitation is trivial. Aortic Valve: The aortic valve was not well visualized. Aortic valve regurgitation is trivial. Mild to moderate aortic valve sclerosis/calcification is present, without any evidence of aortic stenosis. Aortic valve mean gradient measures 5.5 mmHg. Aortic  valve peak gradient measures 9.1 mmHg. Aortic valve area, by VTI measures 2.53 cm. Pulmonic Valve: The pulmonic valve was not well visualized. Pulmonic  valve regurgitation is trivial. Pulmonic regurgitation is trivial. Aorta: The aortic root was not well visualized. IAS/Shunts: The atrial septum is grossly normal.  LEFT VENTRICLE PLAX 2D LVIDd:         4.42 cm  Diastology LVIDs:         2.92 cm  LV e' lateral:   6.96 cm/s LV PW:         1.08 cm  LV E/e' lateral: 27.3 LV IVS:        1.83 cm  LV e' medial:    5.33 cm/s LVOT diam:     2.00 cm  LV E/e' medial:  35.6 LV SV:         56 ml LV SV Index:   24.60 LVOT Area:     3.14 cm  RIGHT VENTRICLE RV Basal diam:  4.61 cm RV S prime:     8.05 cm/s TAPSE (M-mode): 2.8 cm LEFT ATRIUM              Index       RIGHT ATRIUM           Index LA diam:        4.40 cm  2.01 cm/m  RA Area:     28.90 cm LA Vol (A2C):   107.0 ml 48.85 ml/m RA Volume:   106.00 ml 48.39 ml/m LA Vol (A4C):   74.5 ml  34.01 ml/m LA Biplane Vol: 92.9 ml  42.41 ml/m  AORTIC VALVE                    PULMONIC VALVE AV Area (Vmax):    2.11 cm     PV Vmax:        0.59 m/s AV Area (Vmean):   1.96 cm     PV Peak grad:   1.4 mmHg AV Area (VTI):     2.53 cm     RVOT Peak grad: 2 mmHg AV  Vmax:           150.50 cm/s AV Vmean:          106.150 cm/s AV VTI:            0.312 m AV Peak Grad:      9.1 mmHg AV Mean Grad:      5.5 mmHg LVOT Vmax:         101.00 cm/s LVOT Vmean:        66.300 cm/s LVOT VTI:          0.251 m LVOT/AV VTI ratio: 0.80  AORTA Ao Root diam: 3.10 cm MITRAL VALVE                         TRICUSPID VALVE MV Area (PHT): 2.29 cm              TR Peak grad:   30.0 mmHg MV Peak grad:  18.5 mmHg             TR Vmax:        309.00 cm/s MV Mean grad:  6.0 mmHg MV Vmax:       2.15 m/s              SHUNTS MV Vmean:      104.0 cm/s            Systemic VTI:  0.25 m MV VTI:        0.63 m  Systemic Diam: 2.00 cm MV PHT:        95.99 msec MV Decel Time: 331 msec MV E velocity: 190.00 cm/s 103 cm/s MV A velocity: 104.00 cm/s 70.3 cm/s MV E/A ratio:  1.83        1.5  Harold Hedge MD Electronically signed by Harold Hedge MD Signature Date/Time: 11/08/2019/2:37:58 PM    Final    Medications:  Scheduled Meds: . sodium chloride   Intravenous Once  . brimonidine  1 drop Both Eyes BID  . buPROPion  150 mg Oral Daily  . Chlorhexidine Gluconate Cloth  6 each Topical Daily  . escitalopram  10 mg Oral Daily  . ferrous gluconate  324 mg Oral Q breakfast  . insulin aspart  0-15 Units Subcutaneous Q4H  . latanoprost  1 drop Both Eyes BID  . metoprolol succinate  25 mg Oral BID  . pantoprazole (PROTONIX) IV  40 mg Intravenous Q12H  . simvastatin  40 mg Oral q1800  . vitamin B-12  500 mcg Oral Daily   Continuous Infusions: . sodium chloride    . dextrose 5 % and 0.9% NaCl 50 mL/hr at 11/09/19 4540  . iron sucrose     PRN Meds:.acetaminophen, ALPRAZolam   Assessment: Active Problems:   Sepsis (HCC)   Severe anemia   Insulin dependent type 2 diabetes mellitus (HCC)   CKD (chronic kidney disease) stage 3, GFR 30-59 ml/min   History of melena   Acute on chronic diastolic CHF (congestive heart failure) (HCC)   Subtherapeutic international normalized ratio (INR)   Atrial  fibrillation, chronic (HCC)   Chronic anticoagulation   Acute kidney injury superimposed on CKD (HCC)   Melena    Plan: If patient continues to improve from a CHF standpoint and remains on room air, can proceed with EGD and colonoscopy after prep tomorrow  PPI IV twice daily  Continue serial CBCs and transfuse PRN Avoid NSAIDs Maintain 2 large-bore IV lines Please page GI with any acute hemodynamic changes, or signs of active GI bleeding    LOS: 1 day   Jeffrey Bouillon, MD 11/09/2019, 3:56 PM

## 2019-11-09 NOTE — Progress Notes (Addendum)
Progress Note    Jeffrey Mills  PXT:[id number] DOB: [birth year]  DOA: 11/08/2019 PCP: Rusty Aus, MD        Assessment/Plan:   Active Problems:   Sepsis ([place])   Severe anemia   Insulin dependent type 2 diabetes mellitus (HCC)   CKD (chronic kidney disease) stage 3, GFR 30-59 ml/min   History of melena   Acute on chronic diastolic CHF (congestive heart failure) (HCC)   Subtherapeutic international normalized ratio (INR)   Atrial fibrillation, chronic (HCC)   Chronic anticoagulation   Acute kidney injury superimposed on CKD ([place])   Melena   Body mass index is 32.14 kg/m.    Severe acute blood loss anemia/iron deficiency anemia: s/p transfusion with 3 units of packed red blood cells.  Treat with IV iron infusion.  Patient is agreeable to treatment.  Monitor H&H.  Recent GI bleeding/melena: Patient will likely be started on a diet today if GI does not plan any endoscopic work-up today or tomorrow.  Continue V Protonix.  Hold Coumadin.  Follow-up with gastroenterologist for further recommendations.  AKI on CKD stage IIIa with metabolic acidosis: Improved.  Discontinue IV fluids to avoid fluid overload.   Acute hypoxemic respiratory failure: Resolved.  He is tolerating room air.  Acute on chronic diastolic CHF: Improved with blood transfusion.  Hold IV Lasix for now.  2D echo on 11/08/2019 showed EF of 60 to 65%, mild LVH, moderate MR, moderately elevated pulmonary artery systolic pressure  Chronic atrial fibrillation: Coumadin on hold.  Continue metoprolol  IDDM: Hold Metformin and insulin.  Monitor glucose levels closely.  NovoLog as needed for hyperglycemia.  Hypertension: Continue metoprolol   Family Communication/Anticipated D/C date and plan/Code Status   DVT prophylaxis: SCDs Code Status: Full code Family Communication: Discussed with the patient  disposition Plan: Possible discharge to home in 3 days     Subjective:   He moved his bowels this  morning but stool was brown in color.  No blood noted.  No shortness of breath or chest pain.  Objective:    Vitals:   11/08/19 1525 11/08/19 1705 11/08/19 2012 11/08/19 2317  BP: 132/74 (!) 154/66 (!) 162/80 130/75  Pulse: 77 71 77 73  Resp: 18  17 17   Temp: 98.3 F (36.8 C)  98.7 F (37.1 C) 98.6 F (37 C)  TempSrc: Oral     SpO2: 98% 100% 99% 97%  Weight:      Height:        Intake/Output Summary (Last 24 hours) at 11/09/2019 1340 Last data filed at 11/09/2019 1033 Gross per 24 hour  Intake 420 ml  Output 301 ml  Net 119 ml   Filed Weights   11/08/19 1143  Weight: 101.6 kg    Exam:   GEN: NAD SKIN: No rash EYES: EOMI ENT: MMM CV: RRR PULM: CTA B ABD: soft, obese, NT, +BS CNS: AAO x 3, non focal EXT: No edema or tenderness    Data Reviewed:   I have personally reviewed following labs and imaging studies:  Labs: Labs show the following:   Basic Metabolic Panel: Recent Labs  Lab 11/08/19 0318 11/08/19 0327 11/09/19 0519  NA 140 139 142  K 4.6 4.6 4.5  CL 113* 112* 114*  CO2 15* 16* 20*  GLUCOSE 116* 116* 112*  BUN 34* 34* 27*  CREATININE 1.81* 1.84* 1.42*  CALCIUM 8.4* 8.5* 8.3*   GFR Estimated Creatinine Clearance: 48.7 mL/min (A) (by C-G formula based  on SCr of 1.42 mg/dL (H)). Liver Function Tests: Recent Labs  Lab 11/08/19 0327  AST 28  ALT 34  ALKPHOS 77  BILITOT 1.0  PROT 6.9  ALBUMIN 3.4*   No results for input(s): LIPASE, AMYLASE in the last 168 hours. No results for input(s): AMMONIA in the last 168 hours. Coagulation profile Recent Labs  Lab 11/08/19 0318  INR 1.5*    CBC: Recent Labs  Lab 11/08/19 0327 11/08/19 1607 11/09/19 0519  WBC 7.7  --  6.2  NEUTROABS 5.6  --   --   HGB 3.9* 7.2* 7.4*  HCT 13.6* 22.6* 23.2*  MCV 109.7*  --  96.7  PLT 178  --  142*   Cardiac Enzymes: No results for input(s): CKTOTAL, CKMB, CKMBINDEX, TROPONINI in the last 168 hours. BNP (last 3 results) No results for input(s):  PROBNP in the last 8760 hours. CBG: Recent Labs  Lab 11/08/19 2020 11/09/19 0016 11/09/19 0456 11/09/19 0744 11/09/19 1143  GLUCAP 89 105* 105* 127* 173*   D-Dimer: No results for input(s): DDIMER in the last 72 hours. Hgb A1c: Recent Labs    11/08/19 2159  HGBA1C 6.7*   Lipid Profile: No results for input(s): CHOL, HDL, LDLCALC, TRIG, CHOLHDL, LDLDIRECT in the last 72 hours. Thyroid function studies: No results for input(s): TSH, T4TOTAL, T3FREE, THYROIDAB in the last 72 hours.  Invalid input(s): FREET3 Anemia work up: Recent Labs    11/08/19 0318 11/08/19 2159  VITAMINB12  --  566  FOLATE 12.1  --   FERRITIN  --  13*  TIBC 489*  --   IRON 30*  --    Sepsis Labs: Recent Labs  Lab 11/08/19 0327 11/09/19 0519  WBC 7.7 6.2    Microbiology Recent Results (from the past 240 hour(s))  SARS CORONAVIRUS 2 (TAT 6-24 HRS) Nasopharyngeal Nasopharyngeal Swab     Status: None   Collection Time: 11/08/19  5:32 AM   Specimen: Nasopharyngeal Swab  Result Value Ref Range Status   SARS Coronavirus 2 NEGATIVE NEGATIVE Final    Comment: (NOTE) SARS-CoV-2 target nucleic acids are NOT DETECTED. The SARS-CoV-2 RNA is generally detectable in upper and lower respiratory specimens during the acute phase of infection. Negative results do not preclude SARS-CoV-2 infection, do not rule out co-infections with other pathogens, and should not be used as the sole basis for treatment or other patient management decisions. Negative results must be combined with clinical observations, patient history, and epidemiological information. The expected result is Negative. Fact Sheet for Patients: HairSlick.no Fact Sheet for Healthcare Providers: quierodirigir.com This test is not yet approved or cleared by the [place] FDA and  has been authorized for detection and/or diagnosis of SARS-CoV-2 by FDA under an Emergency Use  Authorization (EUA). This EUA will remain  in effect (meaning this test can be used) for the duration of the COVID-19 declaration under Section 56 4(b)(1) of the Act, 21 U.S.C. section 360bbb-3(b)(1), unless the authorization is terminated or revoked sooner. Performed at [facility], [address] [place], [place] [address]     Procedures and diagnostic studies:  DG Chest Portable 1 View  Result Date: 11/08/2019 CLINICAL DATA:  Shortness of breath EXAM: PORTABLE CHEST 1 VIEW COMPARISON:  December 06, 2018 FINDINGS: The heart size is enlarged. The patient is status post prior median sternotomy. There is no pneumothorax. There is vascular congestion. There are developing perihilar airspace opacities. There is no pneumothorax. IMPRESSION: Cardiomegaly with findings of developing congestive heart  failure. Electronically Signed   By: Katherine Mantle M.D.   On: 11/08/2019 03:42   ECHOCARDIOGRAM COMPLETE  Result Date: 11/08/2019   ECHOCARDIOGRAM REPORT   Patient Name:   JSHON IBE [facility] Date of Exam: 11/08/2019 Medical Rec #:  [id number]         Height:       70.0 in Accession #:    [id number]        Weight:       224.0 lb Date of Birth:  01/13/1936          BSA:          2.19 m Patient Age:    83 years          BP:           138/76 mmHg Patient Gender: M                 HR:           66 bpm. Exam Location:  [facility] Procedure: 2D Echo, Cardiac Doppler and Color Doppler Indications:     CHF- acute diastolic 428.31  History:         Patient has prior history of Echocardiogram examinations, most                  recent 12/05/2018. CHF; Risk Factors:Diabetes and Hypertension.                  Mitral valve disorder, PVD.  Sonographer:     Cristela Blue RDCS (AE) Referring Phys:  [id number] Andris Baumann Diagnosing Phys: Harold Hedge MD IMPRESSIONS  1. Left ventricular ejection fraction, by visual estimation, is 60 to 65%. The left ventricle has normal function. Left ventricular septal wall thickness was  mildly increased. Mildly increased left ventricular posterior wall thickness. There is mildly increased left ventricular hypertrophy.  2. The left ventricle has no regional wall motion abnormalities.  3. Global right ventricle has normal systolic function.The right ventricular size is mildly enlarged. No increase in right ventricular wall thickness.  4. Left atrial size was mildly dilated.  5. Right atrial size was normal.  6. The mitral valve was not well visualized. Moderate mitral valve regurgitation.  7. The tricuspid valve is not well visualized. Tricuspid valve regurgitation is trivial.  8. The aortic valve was not well visualized. Aortic valve regurgitation is trivial. Mild to moderate aortic valve sclerosis/calcification without any evidence of aortic stenosis.  9. The pulmonic valve was not well visualized. Pulmonic valve regurgitation is trivial. 10. The aortic root was not well visualized. 11. Moderately elevated pulmonary artery systolic pressure. 12. The atrial septum is grossly normal. FINDINGS  Left Ventricle: Left ventricular ejection fraction, by visual estimation, is 60 to 65%. The left ventricle has normal function. The left ventricle has no regional wall motion abnormalities. Mildly increased left ventricular posterior wall thickness. There is mildly increased left ventricular hypertrophy. Right Ventricle: The right ventricular size is mildly enlarged. No increase in right ventricular wall thickness. Global RV systolic function is has normal systolic function. The tricuspid regurgitant velocity is 2.74 m/s, and with an assumed right atrial  pressure of 10 mmHg, the estimated right ventricular systolic pressure is moderately elevated at 40.0 mmHg. Left Atrium: Left atrial size was mildly dilated. Right Atrium: Right atrial size was normal in size Pericardium: There is no evidence of pericardial effusion. Mitral Valve: The mitral valve was not well visualized. Moderate mitral valve regurgitation.  MV peak gradient, 18.5 mmHg.  Tricuspid Valve: The tricuspid valve is not well visualized. Tricuspid valve regurgitation is trivial. Aortic Valve: The aortic valve was not well visualized. Aortic valve regurgitation is trivial. Mild to moderate aortic valve sclerosis/calcification is present, without any evidence of aortic stenosis. Aortic valve mean gradient measures 5.5 mmHg. Aortic  valve peak gradient measures 9.1 mmHg. Aortic valve area, by VTI measures 2.53 cm. Pulmonic Valve: The pulmonic valve was not well visualized. Pulmonic valve regurgitation is trivial. Pulmonic regurgitation is trivial. Aorta: The aortic root was not well visualized. IAS/Shunts: The atrial septum is grossly normal.  LEFT VENTRICLE PLAX 2D LVIDd:         4.42 cm  Diastology LVIDs:         2.92 cm  LV e' lateral:   6.96 cm/s LV PW:         1.08 cm  LV E/e' lateral: 27.3 LV IVS:        1.83 cm  LV e' medial:    5.33 cm/s LVOT diam:     2.00 cm  LV E/e' medial:  35.6 LV SV:         56 ml LV SV Index:   24.60 LVOT Area:     3.14 cm  RIGHT VENTRICLE RV Basal diam:  4.61 cm RV S prime:     8.05 cm/s TAPSE (M-mode): 2.8 cm LEFT ATRIUM              Index       RIGHT ATRIUM           Index LA diam:        4.40 cm  2.01 cm/m  RA Area:     28.90 cm LA Vol (A2C):   107.0 ml 48.85 ml/m RA Volume:   106.00 ml 48.39 ml/m LA Vol (A4C):   74.5 ml  34.01 ml/m LA Biplane Vol: 92.9 ml  42.41 ml/m  AORTIC VALVE                    PULMONIC VALVE AV Area (Vmax):    2.11 cm     PV Vmax:        0.59 m/s AV Area (Vmean):   1.96 cm     PV Peak grad:   1.4 mmHg AV Area (VTI):     2.53 cm     RVOT Peak grad: 2 mmHg AV Vmax:           150.50 cm/s AV Vmean:          106.150 cm/s AV VTI:            0.312 m AV Peak Grad:      9.1 mmHg AV Mean Grad:      5.5 mmHg LVOT Vmax:         101.00 cm/s LVOT Vmean:        66.300 cm/s LVOT VTI:          0.251 m LVOT/AV VTI ratio: 0.80  AORTA Ao Root diam: 3.10 cm MITRAL VALVE                         TRICUSPID VALVE MV  Area (PHT): 2.29 cm              TR Peak grad:   30.0 mmHg MV Peak grad:  18.5 mmHg             TR Vmax:  309.00 cm/s MV Mean grad:  6.0 mmHg MV Vmax:       2.15 m/s              SHUNTS MV Vmean:      104.0 cm/s            Systemic VTI:  0.25 m MV VTI:        0.63 m                Systemic Diam: 2.00 cm MV PHT:        95.99 msec MV Decel Time: 331 msec MV E velocity: 190.00 cm/s 103 cm/s MV A velocity: 104.00 cm/s 70.3 cm/s MV E/A ratio:  1.83        1.5  Harold Hedge MD Electronically signed by Harold Hedge MD Signature Date/Time: 11/08/2019/2:37:58 PM    Final     Medications:   . sodium chloride   Intravenous Once  . brimonidine  1 drop Both Eyes BID  . buPROPion  150 mg Oral Daily  . Chlorhexidine Gluconate Cloth  6 each Topical Daily  . escitalopram  10 mg Oral Daily  . ferrous gluconate  324 mg Oral Q breakfast  . insulin aspart  0-15 Units Subcutaneous Q4H  . latanoprost  1 drop Both Eyes BID  . metoprolol succinate  25 mg Oral BID  . pantoprazole (PROTONIX) IV  40 mg Intravenous Q12H  . simvastatin  40 mg Oral q1800  . vitamin B-12  500 mcg Oral Daily   Continuous Infusions: . sodium chloride    . dextrose 5 % and 0.9% NaCl 50 mL/hr at 11/09/19 4098  . iron sucrose       LOS: 1 day   Quintella Mura  [facility]   *Please refer to [email or web address], password [email or web address] to get updated schedule on who will round on this patient, as hospitalists switch teams weekly. If 7PM-7AM, please contact night-coverage at [email or web address], password [email or web address] for any overnight needs.  11/09/2019, 1:40 PM

## 2019-11-10 LAB — BASIC METABOLIC PANEL
Anion gap: 7 (ref 5–15)
BUN: 30 mg/dL — ABNORMAL HIGH (ref 8–23)
CO2: 20 mmol/L — ABNORMAL LOW (ref 22–32)
Calcium: 8.3 mg/dL — ABNORMAL LOW (ref 8.9–10.3)
Chloride: 109 mmol/L (ref 98–111)
Creatinine, Ser: 1.54 mg/dL — ABNORMAL HIGH (ref 0.61–1.24)
GFR calc Af Amer: 48 mL/min — ABNORMAL LOW (ref >60)
GFR calc non Af Amer: 42 mL/min — ABNORMAL LOW (ref >60)
Glucose, Bld: 162 mg/dL — ABNORMAL HIGH (ref 70–99)
Potassium: 4.5 mmol/L (ref 3.5–5.1)
Sodium: 136 mmol/L (ref 135–145)

## 2019-11-10 LAB — CBC
HCT: 20.9 % — ABNORMAL LOW (ref 39.0–52.0)
Hemoglobin: 6.7 g/dL — ABNORMAL LOW (ref 13.0–17.0)
MCH: 30.2 pg (ref 26.0–34.0)
MCHC: 32.1 g/dL (ref 30.0–36.0)
MCV: 94.1 fL (ref 80.0–100.0)
Platelets: 128 10*3/uL — ABNORMAL LOW (ref 150–400)
RBC: 2.22 MIL/uL — ABNORMAL LOW (ref 4.22–5.81)
RDW: 18.2 % — ABNORMAL HIGH (ref 11.5–15.5)
WBC: 6 10*3/uL (ref 4.0–10.5)
nRBC: 0 % (ref 0.0–0.2)

## 2019-11-10 LAB — PREPARE RBC (CROSSMATCH)

## 2019-11-10 LAB — HEMOGLOBIN AND HEMATOCRIT, BLOOD
HCT: 22.5 % — ABNORMAL LOW (ref 39.0–52.0)
Hemoglobin: 7.5 g/dL — ABNORMAL LOW (ref 13.0–17.0)

## 2019-11-10 LAB — GLUCOSE, CAPILLARY
Glucose-Capillary: 137 mg/dL — ABNORMAL HIGH (ref 70–99)
Glucose-Capillary: 148 mg/dL — ABNORMAL HIGH (ref 70–99)
Glucose-Capillary: 148 mg/dL — ABNORMAL HIGH (ref 70–99)
Glucose-Capillary: 171 mg/dL — ABNORMAL HIGH (ref 70–99)
Glucose-Capillary: 202 mg/dL — ABNORMAL HIGH (ref 70–99)

## 2019-11-10 MED ORDER — INSULIN ASPART 100 UNIT/ML ~~LOC~~ SOLN
0.0000 [IU] | Freq: Three times a day (TID) | SUBCUTANEOUS | Status: DC
  Administered 2019-11-10: 12:00:00 2 [IU] via SUBCUTANEOUS
  Administered 2019-11-10 (×2): 1 [IU] via SUBCUTANEOUS
  Administered 2019-11-12: 13:00:00 2 [IU] via SUBCUTANEOUS
  Filled 2019-11-10 (×3): qty 1

## 2019-11-10 MED ORDER — INSULIN ASPART 100 UNIT/ML ~~LOC~~ SOLN: 0.0000 [IU] | Freq: Every day | SUBCUTANEOUS | Status: DC

## 2019-11-10 MED ORDER — SODIUM CHLORIDE 0.9% IV SOLUTION
Freq: Once | INTRAVENOUS | Status: AC
  Administered 2019-11-10: 06:00:00 via INTRAVENOUS

## 2019-11-10 NOTE — Progress Notes (Signed)
Progress Note    Jeffrey LennoxLarry W Mills  ZOX:[id number] DOB: 06/18/1936  DOA: 11/08/2019 PCP: Danella PentonMiller, Mark F, MD        Assessment/Plan:   Active Problems:   Sepsis (HCC)   Severe anemia   Insulin dependent type 2 diabetes mellitus (HCC)   CKD (chronic kidney disease) stage 3, GFR 30-59 ml/min   History of melena   Acute on chronic diastolic CHF (congestive heart failure) (HCC)   Subtherapeutic international normalized ratio (INR)   Atrial fibrillation, chronic (HCC)   Chronic anticoagulation   Acute kidney injury superimposed on CKD (HCC)   Melena   Body mass index is 32.14 kg/m.    Severe acute blood loss anemia/iron deficiency anemia: Hemoglobin dropped again from 7.4-6.7 overnight so he was transfused with 1 unit of packed red blood cells today.  S/p transfusion with 4 units total of packed red blood cells.  Continue IV iron sucrose. Monitor H&H.  Recent GI bleeding/melena: Patient is on clear liquid diet and has been made n.p.o. after midnight tonight.  Continue V Protonix.  Hold Coumadin.  Plan for EGD tomorrow.  Follow-up with gastroenterologist for further recommendations.  AKI on CKD stage IIIa with metabolic acidosis: Improved.  Acute hypoxemic respiratory failure: Resolved.    Acute on chronic diastolic CHF: Improved with blood transfusion. 2D echo on 11/08/2019 showed EF of 60 to 65%, mild LVH, moderate MR, moderately elevated pulmonary artery systolic pressure  Chronic atrial fibrillation: Coumadin on hold.  Continue metoprolol  IDDM: Hold Metformin and insulin.  Monitor glucose levels closely.  NovoLog as needed for hyperglycemia.  Hypertension: Continue metoprolol   Family Communication/Anticipated D/C date and plan/Code Status   DVT prophylaxis: SCDs Code Status: Full code Family Communication: Discussed with the patient  disposition Plan: Possible discharge to home in 2 days     Subjective:   Patient has no complaints.  He said he feels  fine.  He does not have any black stools or hematemesis.  Objective:    Vitals:   11/10/19 0519 11/10/19 0545 11/10/19 0615 11/10/19 1001  BP: 123/69 131/67 126/66 (!) 146/58  Pulse: 67 66 61 64  Resp: 16 16 16    Temp: 99 F (37.2 C) 98.7 F (37.1 C) 98.6 F (37 C) 98.2 F (36.8 C)  TempSrc: Oral Oral Oral Oral  SpO2: 96% 99% 96% 100%  Weight:      Height:        Intake/Output Summary (Last 24 hours) at 11/10/2019 1222 Last data filed at 11/10/2019 0958 Gross per 24 hour  Intake 866 ml  Output 251 ml  Net 615 ml   Filed Weights   11/08/19 1143  Weight: 101.6 kg    Exam:   GEN: NAD SKIN: No wounds EYES: pale, anicteric ENT: MMM CV: RRR PULM: CTA B ABD: soft, obese, NT, +BS CNS: AAO x 3, non focal EXT: No edema or tenderness    Data Reviewed:   I have personally reviewed following labs and imaging studies:  Labs: Labs show the following:   Basic Metabolic Panel: Recent Labs  Lab 11/08/19 0318 11/08/19 0327 11/09/19 0519 11/10/19 0340  NA 140 139 142 136  K 4.6 4.6 4.5 4.5  CL 113* 112* 114* 109  CO2 15* 16* 20* 20*  GLUCOSE 116* 116* 112* 162*  BUN 34* 34* 27* 30*  CREATININE 1.81* 1.84* 1.42* 1.54*  CALCIUM 8.4* 8.5* 8.3* 8.3*   GFR Estimated Creatinine Clearance: 44.9 mL/min (A) (by C-G formula  based on SCr of 1.54 mg/dL (H)). Liver Function Tests: Recent Labs  Lab 11/08/19 0327  AST 28  ALT 34  ALKPHOS 77  BILITOT 1.0  PROT 6.9  ALBUMIN 3.4*   No results for input(s): LIPASE, AMYLASE in the last 168 hours. No results for input(s): AMMONIA in the last 168 hours. Coagulation profile Recent Labs  Lab 11/08/19 0318  INR 1.5*    CBC: Recent Labs  Lab 11/08/19 0327 11/08/19 1607 11/09/19 0519 11/10/19 0340  WBC 7.7  --  6.2 6.0  NEUTROABS 5.6  --   --   --   HGB 3.9* 7.2* 7.4* 6.7*  HCT 13.6* 22.6* 23.2* 20.9*  MCV 109.7*  --  96.7 94.1  PLT 178  --  142* 128*   Cardiac Enzymes: No results for input(s): CKTOTAL,  CKMB, CKMBINDEX, TROPONINI in the last 168 hours. BNP (last 3 results) No results for input(s): PROBNP in the last 8760 hours. CBG: Recent Labs  Lab 11/09/19 1953 11/10/19 0036 11/10/19 0416 11/10/19 0735 11/10/19 1137  GLUCAP 190* 202* 148* 148* 171*   D-Dimer: No results for input(s): DDIMER in the last 72 hours. Hgb A1c: Recent Labs    11/08/19 2159  HGBA1C 6.7*   Lipid Profile: No results for input(s): CHOL, HDL, LDLCALC, TRIG, CHOLHDL, LDLDIRECT in the last 72 hours. Thyroid function studies: No results for input(s): TSH, T4TOTAL, T3FREE, THYROIDAB in the last 72 hours.  Invalid input(s): FREET3 Anemia work up: Recent Labs    11/08/19 0318 11/08/19 2159  VITAMINB12  --  566  FOLATE 12.1  --   FERRITIN  --  13*  TIBC 489*  --   IRON 30*  --    Sepsis Labs: Recent Labs  Lab 11/08/19 0327 11/09/19 0519 11/10/19 0340  WBC 7.7 6.2 6.0    Microbiology Recent Results (from the past 240 hour(s))  SARS CORONAVIRUS 2 (TAT 6-24 HRS) Nasopharyngeal Nasopharyngeal Swab     Status: None   Collection Time: 11/08/19  5:32 AM   Specimen: Nasopharyngeal Swab  Result Value Ref Range Status   SARS Coronavirus 2 NEGATIVE NEGATIVE Final    Comment: (NOTE) SARS-CoV-2 target nucleic acids are NOT DETECTED. The SARS-CoV-2 RNA is generally detectable in upper and lower respiratory specimens during the acute phase of infection. Negative results do not preclude SARS-CoV-2 infection, do not rule out co-infections with other pathogens, and should not be used as the sole basis for treatment or other patient management decisions. Negative results must be combined with clinical observations, patient history, and epidemiological information. The expected result is Negative. Fact Sheet for Patients: HairSlick.no Fact Sheet for Healthcare Providers: quierodirigir.com This test is not yet approved or cleared by the [place]  FDA and  has been authorized for detection and/or diagnosis of SARS-CoV-2 by FDA under an Emergency Use Authorization (EUA). This EUA will remain  in effect (meaning this test can be used) for the duration of the COVID-19 declaration under Section 56 4(b)(1) of the Act, 21 U.S.C. section 360bbb-3(b)(1), unless the authorization is terminated or revoked sooner. Performed at [facility], [address], [place], [place] [address]     Procedures and diagnostic studies:  ECHOCARDIOGRAM COMPLETE  Result Date: 11/08/2019   ECHOCARDIOGRAM REPORT   Patient Name:   Jeffrey Mills Date of Exam: 11/08/2019 Medical Rec #:  [id number]         Height:       70.0 in Accession #:    [id number]  Weight:       224.0 lb Date of Birth:  18-May-1936          BSA:          2.19 m Patient Age:    83 years          BP:           138/76 mmHg Patient Gender: M                 HR:           66 bpm. Exam Location:  [facility] Procedure: 2D Echo, Cardiac Doppler and Color Doppler Indications:     CHF- acute diastolic 428.31  History:         Patient has prior history of Echocardiogram examinations, most                  recent 12/05/2018. CHF; Risk Factors:Diabetes and Hypertension.                  Mitral valve disorder, PVD.  Sonographer:     Cristela Blue RDCS (AE) Referring Phys:  [id number] Andris Baumann Diagnosing Phys: Harold Hedge MD IMPRESSIONS  1. Left ventricular ejection fraction, by visual estimation, is 60 to 65%. The left ventricle has normal function. Left ventricular septal wall thickness was mildly increased. Mildly increased left ventricular posterior wall thickness. There is mildly increased left ventricular hypertrophy.  2. The left ventricle has no regional wall motion abnormalities.  3. Global right ventricle has normal systolic function.The right ventricular size is mildly enlarged. No increase in right ventricular wall thickness.  4. Left atrial size was mildly dilated.  5. Right atrial size was normal.   6. The mitral valve was not well visualized. Moderate mitral valve regurgitation.  7. The tricuspid valve is not well visualized. Tricuspid valve regurgitation is trivial.  8. The aortic valve was not well visualized. Aortic valve regurgitation is trivial. Mild to moderate aortic valve sclerosis/calcification without any evidence of aortic stenosis.  9. The pulmonic valve was not well visualized. Pulmonic valve regurgitation is trivial. 10. The aortic root was not well visualized. 11. Moderately elevated pulmonary artery systolic pressure. 12. The atrial septum is grossly normal. FINDINGS  Left Ventricle: Left ventricular ejection fraction, by visual estimation, is 60 to 65%. The left ventricle has normal function. The left ventricle has no regional wall motion abnormalities. Mildly increased left ventricular posterior wall thickness. There is mildly increased left ventricular hypertrophy. Right Ventricle: The right ventricular size is mildly enlarged. No increase in right ventricular wall thickness. Global RV systolic function is has normal systolic function. The tricuspid regurgitant velocity is 2.74 m/s, and with an assumed right atrial  pressure of 10 mmHg, the estimated right ventricular systolic pressure is moderately elevated at 40.0 mmHg. Left Atrium: Left atrial size was mildly dilated. Right Atrium: Right atrial size was normal in size Pericardium: There is no evidence of pericardial effusion. Mitral Valve: The mitral valve was not well visualized. Moderate mitral valve regurgitation. MV peak gradient, 18.5 mmHg. Tricuspid Valve: The tricuspid valve is not well visualized. Tricuspid valve regurgitation is trivial. Aortic Valve: The aortic valve was not well visualized. Aortic valve regurgitation is trivial. Mild to moderate aortic valve sclerosis/calcification is present, without any evidence of aortic stenosis. Aortic valve mean gradient measures 5.5 mmHg. Aortic  valve peak gradient measures 9.1 mmHg.  Aortic valve area, by VTI measures 2.53 cm. Pulmonic Valve: The pulmonic valve was not well visualized. Pulmonic  valve regurgitation is trivial. Pulmonic regurgitation is trivial. Aorta: The aortic root was not well visualized. IAS/Shunts: The atrial septum is grossly normal.  LEFT VENTRICLE PLAX 2D LVIDd:         4.42 cm  Diastology LVIDs:         2.92 cm  LV e' lateral:   6.96 cm/s LV PW:         1.08 cm  LV E/e' lateral: 27.3 LV IVS:        1.83 cm  LV e' medial:    5.33 cm/s LVOT diam:     2.00 cm  LV E/e' medial:  35.6 LV SV:         56 ml LV SV Index:   24.60 LVOT Area:     3.14 cm  RIGHT VENTRICLE RV Basal diam:  4.61 cm RV S prime:     8.05 cm/s TAPSE (M-mode): 2.8 cm LEFT ATRIUM              Index       RIGHT ATRIUM           Index LA diam:        4.40 cm  2.01 cm/m  RA Area:     28.90 cm LA Vol (A2C):   107.0 ml 48.85 ml/m RA Volume:   106.00 ml 48.39 ml/m LA Vol (A4C):   74.5 ml  34.01 ml/m LA Biplane Vol: 92.9 ml  42.41 ml/m  AORTIC VALVE                    PULMONIC VALVE AV Area (Vmax):    2.11 cm     PV Vmax:        0.59 m/s AV Area (Vmean):   1.96 cm     PV Peak grad:   1.4 mmHg AV Area (VTI):     2.53 cm     RVOT Peak grad: 2 mmHg AV Vmax:           150.50 cm/s AV Vmean:          106.150 cm/s AV VTI:            0.312 m AV Peak Grad:      9.1 mmHg AV Mean Grad:      5.5 mmHg LVOT Vmax:         101.00 cm/s LVOT Vmean:        66.300 cm/s LVOT VTI:          0.251 m LVOT/AV VTI ratio: 0.80  AORTA Ao Root diam: 3.10 cm MITRAL VALVE                         TRICUSPID VALVE MV Area (PHT): 2.29 cm              TR Peak grad:   30.0 mmHg MV Peak grad:  18.5 mmHg             TR Vmax:        309.00 cm/s MV Mean grad:  6.0 mmHg MV Vmax:       2.15 m/s              SHUNTS MV Vmean:      104.0 cm/s            Systemic VTI:  0.25 m MV VTI:        0.63 m  Systemic Diam: 2.00 cm MV PHT:        95.99 msec MV Decel Time: 331 msec MV E velocity: 190.00 cm/s 103 cm/s MV A velocity: 104.00 cm/s 70.3  cm/s MV E/A ratio:  1.83        1.5  Harold Hedge MD Electronically signed by Harold Hedge MD Signature Date/Time: 11/08/2019/2:37:58 PM    Final     Medications:   . sodium chloride   Intravenous Once  . brimonidine  1 drop Both Eyes BID  . buPROPion  150 mg Oral Daily  . Chlorhexidine Gluconate Cloth  6 each Topical Daily  . escitalopram  10 mg Oral Daily  . ferrous gluconate  324 mg Oral Q breakfast  . insulin aspart  0-5 Units Subcutaneous QHS  . insulin aspart  0-9 Units Subcutaneous TID WC  . latanoprost  1 drop Both Eyes BID  . metoprolol succinate  25 mg Oral BID  . pantoprazole (PROTONIX) IV  40 mg Intravenous Q12H  . polyethylene glycol-electrolytes  4,000 mL Oral Once  . simvastatin  40 mg Oral q1800  . vitamin B-12  500 mcg Oral Daily   Continuous Infusions: . sodium chloride    . iron sucrose 200 mg (11/10/19 1158)     LOS: 2 days   Chardae Mulkern  [facility]   *Please refer to [email or web address], password [email or web address] to get updated schedule on who will round on this patient, as hospitalists switch teams weekly. If 7PM-7AM, please contact night-coverage at [email or web address], password [email or web address] for any overnight needs.  11/10/2019, 12:22 PM

## 2019-11-10 NOTE — Progress Notes (Signed)
Patient's hemoglobin 6.7, notified on call provider Rufina Falco NP, received order to transfuse 1 unit of blood.

## 2019-11-10 NOTE — Consult Note (Signed)
Cardiology Consultation Note    Patient ID: Jeffrey Mills, MRN: [id number], DOB/AGE: 12/07/1935 83 y.o. Admit date: 11/08/2019   Date of Consult: 11/10/2019 Primary Physician: Danella Penton, MD Primary Cardiologist: Dr. Darrold Junker  Chief Complaint: anemia Reason for Consultation: pre egd Requesting MD: Dr. Myriam Forehand      HPI: Jeffrey Mills is a 83 y.o. male with history of cad s/p cabg x 4 with mvr in 1999, pci of m1 2010, htn, hyperlipidemia, dm and afib who was admitted with anemia. Informed of urgent consult by secure messaging this afternoon. Echo done this admission revealed normal lv function with mild to moderate mr and trivial ai. Has ruled out for an mi with normal hsTrop despite severely reduce hgb of 3.9. Had acute on chronic renal insuffiency with creatinine of 1.84 up from baseline of 1.29. EKG showed nsr with no ischemia. Cxr was rea as showing cardiomegaly with mild pulmonary vascular congestion. INR was subtherapeutic at 1.5 on admission. kPt is on wafrarin. His hgb was 7.4 at [facility] on 10/14/2019 and he was treated  With ferrous sulfate and vit b12. He has had some dark stools. He was transfused. He is hemodynamically stable. He has diuresed 1.2 liters since admission.   Past Medical History:  Diagnosis Date  . Anemia   . Anxiety   . Arthritis   . Benign hypertension   . Cardiopulmonary arrest (HCC)   . CHF (congestive heart failure) (HCC)   . Coronary atherosclerosis of native coronary artery   . Depression   . Diabetes (HCC)   . Glaucoma   . Hyperlipidemia   . Mitral valve disorder   . Neuralgia   . PVD (peripheral vascular disease) [facility])       Surgical History:  Past Surgical History:  Procedure Laterality Date  . CHOLECYSTECTOMY    . CORONARY ANGIOPLASTY WITH STENT PLACEMENT    . CORONARY ARTERY BYPASS GRAFT    . MITRAL VALVE ANNULOPLASTY    . TEE WITHOUT CARDIOVERSION N/A 12/05/2018   Procedure: TRANSESOPHAGEAL ECHOCARDIOGRAM (TEE);  Surgeon: Dalia Heading, MD;  Location: [facility] ORS;  Service: Cardiovascular;  Laterality: N/A;     Home Meds: Prior to Admission medications   Medication Sig Start Date End Date Taking? Authorizing Provider  ALPRAZolam Prudy Feeler) 0.5 MG tablet Take 0.25 mg by mouth 2 (two) times daily as needed for anxiety.    Yes [provider]  aspirin 81 MG tablet Take 81 mg by mouth daily.   Yes [provider]  brimonidine (ALPHAGAN) 0.2 % ophthalmic solution Place 1 drop into both eyes 2 (two) times daily.   Yes [provider]  escitalopram (LEXAPRO) 10 MG tablet Take 10 mg by mouth daily.    Yes [provider]  ferrous gluconate (FERGON) 324 MG tablet Take 324 mg by mouth 2 (two) times daily.    Yes [provider]  gabapentin (NEURONTIN) 100 MG capsule Take 100 mg by mouth daily.    Yes [provider]  insulin lispro protamine-lispro (HUMALOG 75/25 MIX) (75-25) 100 UNIT/ML SUSP injection Inject 55-60 Units into the skin 2 (two) times daily with a meal. Take 55 units before breakfast and 60 units before supper   Yes [provider]  insulin regular (NOVOLIN R) 100 units/mL injection Inject into the skin 3 (three) times daily before meals. Dose per sliding Scale dose max of 10 units daily   Yes [provider]  latanoprost (XALATAN) 0.005 % ophthalmic solution  Place 1 drop into both eyes at bedtime.  05/28/14  Yes [provider]  magnesium oxide (MAG-OX) 400 MG tablet Take 400 mg by mouth daily.   Yes [provider]  meclizine (ANTIVERT) 25 MG tablet Take 25 mg by mouth 3 (three) times daily as needed for dizziness.   Yes [provider]  metFORMIN (GLUCOPHAGE) 500 MG tablet Take 1,000 mg by mouth 2 (two) times daily with a meal.    Yes [provider]  metoprolol succinate (TOPROL-XL) 25 MG 24 hr tablet Take 25 mg by mouth 2 (two) times daily.    Yes [provider]  pantoprazole (PROTONIX) 40 MG tablet  Take 40 mg by mouth 2 (two) times daily.    Yes [provider]  simvastatin (ZOCOR) 40 MG tablet Take 40 mg by mouth daily.   Yes [provider]  traMADol (ULTRAM) 50 MG tablet Take 25 mg by mouth 3 (three) times daily as needed for moderate pain.   Yes [provider]  vitamin B-12 (CYANOCOBALAMIN) 500 MCG tablet Take 500 mcg by mouth daily.   Yes [provider]  warfarin (COUMADIN) 5 MG tablet Take 7.5-10 mg by mouth as directed. Take 10 mg on Tuesday and Thursday. Take 7.5 mg all other days.   Yes [provider]  Insulin Syringe-Needle U-100 (INSULIN SYRINGE .3CC/31GX5/16") 31G X 5/16" 0.3 ML MISC by Does not apply route.    [provider]    Inpatient Medications:  . sodium chloride   Intravenous Once  . brimonidine  1 drop Both Eyes BID  . buPROPion  150 mg Oral Daily  . Chlorhexidine Gluconate Cloth  6 each Topical Daily  . escitalopram  10 mg Oral Daily  . ferrous gluconate  324 mg Oral Q breakfast  . insulin aspart  0-5 Units Subcutaneous QHS  . insulin aspart  0-9 Units Subcutaneous TID WC  . latanoprost  1 drop Both Eyes BID  . metoprolol succinate  25 mg Oral BID  . pantoprazole (PROTONIX) IV  40 mg Intravenous Q12H  . polyethylene glycol-electrolytes  4,000 mL Oral Once  . simvastatin  40 mg Oral q1800  . vitamin B-12  500 mcg Oral Daily   . sodium chloride    . iron sucrose 200 mg (11/10/19 1158)    Allergies:  Allergies  Allergen Reactions  . Captopril Other (See Comments)    Reaction: unknown  . Morphine And Related Other (See Comments)    Reaction: unknown  . Penicillins Other (See Comments)    Reaction: unknown Did it involve swelling of the face/tongue/throat, SOB, or low BP? Unknown Did it involve sudden or severe rash/hives, skin peeling, or any reaction on the inside of your mouth or nose? Unknown Did you need to seek medical attention at a hospital or doctor's office? Unknown When did it last  happen?unknown If all above answers are "NO", may proceed with cephalosporin use.     Social History   Socioeconomic History  . Marital status: Widowed    Spouse name: Not on file  . Number of children: 2  . Years of education: Not on file  . Highest education level: Not on file  Occupational History  . Occupation: retired  Tobacco Use  . Smoking status: Never Smoker  . Smokeless tobacco: Never Used  Substance and Sexual Activity  . Alcohol use: No  . Drug use: No  . Sexual activity: Not on file  Other Topics Concern  .  Not on file  Social History Narrative  . Not on file   Social Determinants of Health   Financial Resource Strain:   . Difficulty of Paying Living Expenses: Not on file  Food Insecurity:   . Worried About Programme researcher, broadcasting/film/videounning Out of Food in the Last Year: Not on file  . Ran Out of Food in the Last Year: Not on file  Transportation Needs:   . Lack of Transportation (Medical): Not on file  . Lack of Transportation (Non-Medical): Not on file  Physical Activity:   . Days of Exercise per Week: Not on file  . Minutes of Exercise per Session: Not on file  Stress:   . Feeling of Stress : Not on file  Social Connections:   . Frequency of Communication with Friends and Family: Not on file  . Frequency of Social Gatherings with Friends and Family: Not on file  . Attends Religious Services: Not on file  . Active Member of Clubs or Organizations: Not on file  . Attends BankerClub or Organization Meetings: Not on file  . Marital Status: Not on file  Intimate Partner Violence:   . Fear of Current or Ex-Partner: Not on file  . Emotionally Abused: Not on file  . Physically Abused: Not on file  . Sexually Abused: Not on file     Family History  Problem Relation Age of Onset  . Heart disease Father   . ALS Father   . Hypertension Mother   . Breast cancer Mother   . Heart attack Paternal Uncle   . Diabetes Son   . Heart disease Son      Review of Systems: A 12-system  review of systems was performed and is negative except as noted in the HPI.  Labs: No results for input(s): CKTOTAL, CKMB, TROPONINI in the last 72 hours. Lab Results  Component Value Date   WBC 6.0 11/10/2019   HGB 6.7 (L) 11/10/2019   HCT 20.9 (L) 11/10/2019   MCV 94.1 11/10/2019   PLT 128 (L) 11/10/2019    Recent Labs  Lab 11/08/19 0327 11/10/19 0340  NA 139 136  K 4.6 4.5  CL 112* 109  CO2 16* 20*  BUN 34* 30*  CREATININE 1.84* 1.54*  CALCIUM 8.5* 8.3*  PROT 6.9  --   BILITOT 1.0  --   ALKPHOS 77  --   ALT 34  --   AST 28  --   GLUCOSE 116* 162*   No results found for: CHOL, HDL, LDLCALC, TRIG No results found for: DDIMER  Radiology/Studies:  DG Chest Portable 1 View  Result Date: 11/08/2019 CLINICAL DATA:  Shortness of breath EXAM: PORTABLE CHEST 1 VIEW COMPARISON:  December 06, 2018 FINDINGS: The heart size is enlarged. The patient is status post prior median sternotomy. There is no pneumothorax. There is vascular congestion. There are developing perihilar airspace opacities. There is no pneumothorax. IMPRESSION: Cardiomegaly with findings of developing congestive heart failure. Electronically Signed   By: Katherine Mantlehristopher  Green M.D.   On: 11/08/2019 03:42   ECHOCARDIOGRAM COMPLETE  Result Date: 11/08/2019   ECHOCARDIOGRAM REPORT   Patient Name:   Ellsworth LennoxLARRY W Matsushita Date of Exam: 11/08/2019 Medical Rec #:  [id number]         Height:       70.0 in Accession #:    [id number]        Weight:       224.0 lb Date of Birth:  11/16/1936  BSA:          2.19 m Patient Age:    83 years          BP:           138/76 mmHg Patient Gender: M                 HR:           66 bpm. Exam Location:  [facility] Procedure: 2D Echo, Cardiac Doppler and Color Doppler Indications:     CHF- acute diastolic 428.31  History:         Patient has prior history of Echocardiogram examinations, most                  recent 12/05/2018. CHF; Risk Factors:Diabetes and Hypertension.                  Mitral valve  disorder, PVD.  Sonographer:     Cristela Blue RDCS (AE) Referring Phys:  [id number] Andris Baumann Diagnosing Phys: Harold Hedge MD IMPRESSIONS  1. Left ventricular ejection fraction, by visual estimation, is 60 to 65%. The left ventricle has normal function. Left ventricular septal wall thickness was mildly increased. Mildly increased left ventricular posterior wall thickness. There is mildly increased left ventricular hypertrophy.  2. The left ventricle has no regional wall motion abnormalities.  3. Global right ventricle has normal systolic function.The right ventricular size is mildly enlarged. No increase in right ventricular wall thickness.  4. Left atrial size was mildly dilated.  5. Right atrial size was normal.  6. The mitral valve was not well visualized. Moderate mitral valve regurgitation.  7. The tricuspid valve is not well visualized. Tricuspid valve regurgitation is trivial.  8. The aortic valve was not well visualized. Aortic valve regurgitation is trivial. Mild to moderate aortic valve sclerosis/calcification without any evidence of aortic stenosis.  9. The pulmonic valve was not well visualized. Pulmonic valve regurgitation is trivial. 10. The aortic root was not well visualized. 11. Moderately elevated pulmonary artery systolic pressure. 12. The atrial septum is grossly normal. FINDINGS  Left Ventricle: Left ventricular ejection fraction, by visual estimation, is 60 to 65%. The left ventricle has normal function. The left ventricle has no regional wall motion abnormalities. Mildly increased left ventricular posterior wall thickness. There is mildly increased left ventricular hypertrophy. Right Ventricle: The right ventricular size is mildly enlarged. No increase in right ventricular wall thickness. Global RV systolic function is has normal systolic function. The tricuspid regurgitant velocity is 2.74 m/s, and with an assumed right atrial  pressure of 10 mmHg, the estimated right ventricular systolic  pressure is moderately elevated at 40.0 mmHg. Left Atrium: Left atrial size was mildly dilated. Right Atrium: Right atrial size was normal in size Pericardium: There is no evidence of pericardial effusion. Mitral Valve: The mitral valve was not well visualized. Moderate mitral valve regurgitation. MV peak gradient, 18.5 mmHg. Tricuspid Valve: The tricuspid valve is not well visualized. Tricuspid valve regurgitation is trivial. Aortic Valve: The aortic valve was not well visualized. Aortic valve regurgitation is trivial. Mild to moderate aortic valve sclerosis/calcification is present, without any evidence of aortic stenosis. Aortic valve mean gradient measures 5.5 mmHg. Aortic  valve peak gradient measures 9.1 mmHg. Aortic valve area, by VTI measures 2.53 cm. Pulmonic Valve: The pulmonic valve was not well visualized. Pulmonic valve regurgitation is trivial. Pulmonic regurgitation is trivial. Aorta: The aortic root was not well visualized. IAS/Shunts: The atrial septum is grossly normal.  LEFT VENTRICLE PLAX 2D LVIDd:         4.42 cm  Diastology LVIDs:         2.92 cm  LV e' lateral:   6.96 cm/s LV PW:         1.08 cm  LV E/e' lateral: 27.3 LV IVS:        1.83 cm  LV e' medial:    5.33 cm/s LVOT diam:     2.00 cm  LV E/e' medial:  35.6 LV SV:         56 ml LV SV Index:   24.60 LVOT Area:     3.14 cm  RIGHT VENTRICLE RV Basal diam:  4.61 cm RV S prime:     8.05 cm/s TAPSE (M-mode): 2.8 cm LEFT ATRIUM              Index       RIGHT ATRIUM           Index LA diam:        4.40 cm  2.01 cm/m  RA Area:     28.90 cm LA Vol (A2C):   107.0 ml 48.85 ml/m RA Volume:   106.00 ml 48.39 ml/m LA Vol (A4C):   74.5 ml  34.01 ml/m LA Biplane Vol: 92.9 ml  42.41 ml/m  AORTIC VALVE                    PULMONIC VALVE AV Area (Vmax):    2.11 cm     PV Vmax:        0.59 m/s AV Area (Vmean):   1.96 cm     PV Peak grad:   1.4 mmHg AV Area (VTI):     2.53 cm     RVOT Peak grad: 2 mmHg AV Vmax:           150.50 cm/s AV Vmean:           106.150 cm/s AV VTI:            0.312 m AV Peak Grad:      9.1 mmHg AV Mean Grad:      5.5 mmHg LVOT Vmax:         101.00 cm/s LVOT Vmean:        66.300 cm/s LVOT VTI:          0.251 m LVOT/AV VTI ratio: 0.80  AORTA Ao Root diam: 3.10 cm MITRAL VALVE                         TRICUSPID VALVE MV Area (PHT): 2.29 cm              TR Peak grad:   30.0 mmHg MV Peak grad:  18.5 mmHg             TR Vmax:        309.00 cm/s MV Mean grad:  6.0 mmHg MV Vmax:       2.15 m/s              SHUNTS MV Vmean:      104.0 cm/s            Systemic VTI:  0.25 m MV VTI:        0.63 m                Systemic Diam: 2.00 cm MV PHT:        95.99 msec MV  Decel Time: 331 msec MV E velocity: 190.00 cm/s 103 cm/s MV A velocity: 104.00 cm/s 70.3 cm/s MV E/A ratio:  1.83        1.5  Harold Hedge MD Electronically signed by Harold Hedge MD Signature Date/Time: 11/08/2019/2:37:58 PM    Final     Wt Readings from Last 3 Encounters:  11/08/19 101.6 kg  12/06/18 107.4 kg  07/24/17 106.1 kg    EKG: nsr  Physical Exam:  Blood pressure (!) 146/58, pulse 64, temperature 98.2 F (36.8 C), temperature source Oral, resp. rate 16, height  (1.778 m), weight 101.6 kg, SpO2 100 %. Body mass index is 32.14 kg/m. General: Well developed, well nourished, in no acute distress. Head: Normocephalic, atraumatic, sclera non-icteric, no xanthomas, nares are without discharge.  Neck: Negative for carotid bruits. JVD not elevated. Lungs: Clear bilaterally to auscultation without wheezes, rales, or rhonchi. Breathing is unlabored. Heart: RRR with S1 S2.  1 out of 6 to 2 out of 6 systolic murmur radiating to the left lower sternal border.. Abdomen: Soft, non-tender, non-distended with normoactive bowel sounds. No hepatomegaly. No rebound/guarding. No obvious abdominal masses. Msk:  Strength and tone appear normal for age. Extremities: No clubbing or cyanosis. No edema.  Distal pedal pulses are 2+ and equal bilaterally. Neuro: Alert and oriented X  3. No facial asymmetry. No focal deficit. Moves all extremities spontaneously. Psych:  Responds to questions appropriately with a normal affect.     Assessment and Plan  83 year old male with history of coronary artery disease status post coronary artery bypass grafting x4 with a mitral valve valvuloplasty at [facility] in 1999, history of PCI of the OM1 in 2010, hypertension, diabetes, atrial fibrillation anticoagulated with warfarin who presented with shortness of breath and fatigue.  He was noted to be severely anemic with a hemoglobin of 3.9.  He also had acute on chronic renal insufficiency.  EKG showed sinus rhythm with no ischemia.  Chest x-ray was read as showing possible mild pulmonary edema.  He received multiple units of blood transfusion.  He feels a lot better.  He denies any chest pain and no shortness of breath.  He has diuresed 1.2 L since admission.  He is dynamically stable.  Echocardiogram done during this admission revealed again preserved LV function with no significant structural abnormalities.  He had moderate MR.  He underwent a transesophageal echo earlier this year with no endocarditis.  He ruled out for myocardial infarction despite severe anemia.  He has no clinical evidence of heart failure at present.  Would proceed with EGD and colonoscopy with monitored sedation with routine cardiac monitoring.  No further cardiac work-up indicated as he appears to be optimized from a cardiac standpoint for this procedure. Would give clindamycin 900 mg iv 60 minutes before procedure due to relative risk of sbe due to valvuloplasty of mitral valve. PCN allergic.   Signed, Dalia Heading MD 11/10/2019, 2:56 PM Pager: [phone]

## 2019-11-10 NOTE — Plan of Care (Signed)

## 2019-11-11 ENCOUNTER — Inpatient Hospital Stay: Payer: Medicare Other | Admitting: Anesthesiology

## 2019-11-11 ENCOUNTER — Encounter: Payer: Self-pay | Admitting: Internal Medicine

## 2019-11-11 ENCOUNTER — Encounter
Admission: EM | Disposition: A | Discharge status: Home / Self Care | Payer: Self-pay | Source: Home / Self Care | Attending: Internal Medicine

## 2019-11-11 ENCOUNTER — Inpatient Hospital Stay: Payer: Medicare Other

## 2019-11-11 DIAGNOSIS — Z8719 Personal history of other diseases of the digestive system: Secondary | ICD-10-CM

## 2019-11-11 DIAGNOSIS — K31819 Angiodysplasia of stomach and duodenum without bleeding: Secondary | ICD-10-CM

## 2019-11-11 DIAGNOSIS — D500 Iron deficiency anemia secondary to blood loss (chronic): Secondary | ICD-10-CM

## 2019-11-11 HISTORY — PX: ESOPHAGOGASTRODUODENOSCOPY (EGD) WITH PROPOFOL: SHX5813

## 2019-11-11 HISTORY — PX: COLONOSCOPY WITH PROPOFOL: SHX5780

## 2019-11-11 LAB — TYPE AND SCREEN
ABO/RH(D): A NEG
Antibody Screen: NEGATIVE
Unit division: 0
Unit division: 0
Unit division: 0
Unit division: 0

## 2019-11-11 LAB — CBC
HCT: 25.7 % — ABNORMAL LOW (ref 39.0–52.0)
Hemoglobin: 8.4 g/dL — ABNORMAL LOW (ref 13.0–17.0)
MCH: 30.9 pg (ref 26.0–34.0)
MCHC: 32.7 g/dL (ref 30.0–36.0)
MCV: 94.5 fL (ref 80.0–100.0)
Platelets: 131 10*3/uL — ABNORMAL LOW (ref 150–400)
RBC: 2.72 MIL/uL — ABNORMAL LOW (ref 4.22–5.81)
RDW: 18.8 % — ABNORMAL HIGH (ref 11.5–15.5)
WBC: 6.7 10*3/uL (ref 4.0–10.5)
nRBC: 0.3 % — ABNORMAL HIGH (ref 0.0–0.2)

## 2019-11-11 LAB — PROTIME-INR
INR: 1.4 — ABNORMAL HIGH (ref 0.8–1.2)
Prothrombin Time: 17 seconds — ABNORMAL HIGH (ref 11.4–15.2)

## 2019-11-11 LAB — BASIC METABOLIC PANEL
Anion gap: 10 (ref 5–15)
BUN: 17 mg/dL (ref 8–23)
CO2: 21 mmol/L — ABNORMAL LOW (ref 22–32)
Calcium: 8.7 mg/dL — ABNORMAL LOW (ref 8.9–10.3)
Chloride: 107 mmol/L (ref 98–111)
Creatinine, Ser: 1.31 mg/dL — ABNORMAL HIGH (ref 0.61–1.24)
GFR calc Af Amer: 59 mL/min — ABNORMAL LOW (ref >60)
GFR calc non Af Amer: 51 mL/min — ABNORMAL LOW (ref >60)
Glucose, Bld: 121 mg/dL — ABNORMAL HIGH (ref 70–99)
Potassium: 4.3 mmol/L (ref 3.5–5.1)
Sodium: 138 mmol/L (ref 135–145)

## 2019-11-11 LAB — BPAM RBC
Blood Product Expiration Date: 202012232359
Blood Product Expiration Date: 202012272359
Blood Product Expiration Date: 202101082359
Blood Product Expiration Date: 202101092359
ISSUE DATE / TIME: 202012110430
ISSUE DATE / TIME: 202012110430
ISSUE DATE / TIME: 202012111044
ISSUE DATE / TIME: 202012130547
Unit Type and Rh: 5100
Unit Type and Rh: 5100
Unit Type and Rh: 600
Unit Type and Rh: 600

## 2019-11-11 LAB — GLUCOSE, CAPILLARY
Glucose-Capillary: 119 mg/dL — ABNORMAL HIGH (ref 70–99)
Glucose-Capillary: 124 mg/dL — ABNORMAL HIGH (ref 70–99)
Glucose-Capillary: 113 mg/dL — ABNORMAL HIGH (ref 70–99)

## 2019-11-11 SURGERY — COLONOSCOPY WITH PROPOFOL
Anesthesia: General

## 2019-11-11 MED ORDER — PROPOFOL 10 MG/ML IV BOLUS
INTRAVENOUS | Status: DC | PRN
  Administered 2019-11-11: 180 ug/kg/min via INTRAVENOUS

## 2019-11-11 MED ORDER — IPRATROPIUM-ALBUTEROL 0.5-2.5 (3) MG/3ML IN SOLN
3.0000 mL | Freq: Four times a day (QID) | RESPIRATORY_TRACT | Status: DC | PRN
  Administered 2019-11-11: 02:00:00 3 mL via RESPIRATORY_TRACT
  Filled 2019-11-11: qty 3

## 2019-11-11 MED ORDER — FUROSEMIDE 10 MG/ML IJ SOLN
40.0000 mg | Freq: Once | INTRAMUSCULAR | Status: AC
  Administered 2019-11-11: 05:00:00 40 mg via INTRAVENOUS
  Filled 2019-11-11: qty 4

## 2019-11-11 MED ORDER — WARFARIN - PHARMACIST DOSING INPATIENT: Freq: Every day | Status: DC

## 2019-11-11 MED ORDER — SODIUM CHLORIDE 0.9 % IV SOLN
INTRAVENOUS | Status: DC
  Administered 2019-11-11: 13:00:00 via INTRAVENOUS

## 2019-11-11 MED ORDER — PROPOFOL 500 MG/50ML IV EMUL
INTRAVENOUS | Status: AC
  Filled 2019-11-11: qty 50

## 2019-11-11 MED ORDER — EPHEDRINE SULFATE 50 MG/ML IJ SOLN
INTRAMUSCULAR | Status: DC | PRN
  Administered 2019-11-11: 15 mg via INTRAVENOUS

## 2019-11-11 MED ORDER — WARFARIN SODIUM 7.5 MG PO TABS
7.5000 mg | ORAL_TABLET | Freq: Once | ORAL | Status: AC
  Administered 2019-11-11: 20:00:00 7.5 mg via ORAL
  Filled 2019-11-11: qty 1

## 2019-11-11 NOTE — Anesthesia Post-op Follow-up Note (Signed)
Anesthesia QCDR form completed.        

## 2019-11-11 NOTE — Anesthesia Procedure Notes (Addendum)
Date/Time: 11/11/2019 2:20 PM Performed by: Allean Found, CRNA Pre-anesthesia Checklist: Patient identified, Emergency Drugs available, Suction available, Patient being monitored and Timeout performed Patient Re-evaluated:Patient Re-evaluated prior to induction Oxygen Delivery Method: Nasal cannula Induction Type: IV induction Placement Confirmation: positive ETCO2

## 2019-11-11 NOTE — Consult Note (Signed)
[place] for Warfarin Indication: atrial fibrillation  Allergies  Allergen Reactions  . Captopril Other (See Comments)    Reaction: unknown  . Morphine And Related Other (See Comments)    Reaction: unknown  . Penicillins Other (See Comments)    Reaction: unknown Did it involve swelling of the face/tongue/throat, SOB, or low BP? Unknown Did it involve sudden or severe rash/hives, skin peeling, or any reaction on the inside of your mouth or nose? Unknown Did you need to seek medical attention at a hospital or doctor's office? Unknown When did it last happen?unknown If all above answers are "NO", may proceed with cephalosporin use.     Patient Measurements: Height: 5\' 10"  (177.8 cm) Weight: 234 lb (106.1 kg) IBW/kg (Calculated) : 73   Vital Signs: Temp: 97.6 F (36.4 C) (12/14 1459) Temp Source: Temporal (12/14 1459) BP: 116/72 (12/14 1519) Pulse Rate: 75 (12/14 1519)  Labs: Recent Labs    11/08/19 2159 11/09/19 0519 11/10/19 0340 11/10/19 1451 11/11/19 0543  HGB  --  7.4* 6.7* 7.5* 8.4*  HCT  --  23.2* 20.9* 22.5* 25.7*  PLT  --  142* 128*  --  131*  CREATININE  --  1.42* 1.54*  --  1.31*  TROPONINIHS 14  --   --   --   --     Estimated Creatinine Clearance: 53.9 mL/min (A) (by C-G formula based on SCr of 1.31 mg/dL (H)).   Medications:  Warfarin 10 mg Tuesday and Thursday and 7.5 mg all other days- last dose 12/10; warfarin regimen confirmed with patient.   Assessment: Patient admitted for GI bleed and warfarin has been held. Per GI, patient can resume warfarin and this was confirmed with GI MD.    Today's INR: subtherapeutic   Date INR Dose 12/14 1.4 7.5 mg   Goal of Therapy:  INR 2-3 Monitor platelets by anticoagulation protocol: Yes   Plan:  Will order warfarin 7.5 mg x1 dose for tonight. Will check INR daily and CBC every 3 days, unless otherwise indicated frequently.   [name] 11/11/2019,3:24  PM

## 2019-11-11 NOTE — Progress Notes (Addendum)
Progress Note    Jeffrey Mills  ZOX:[id number] DOB: 09-13-1936  DOA: 11/08/2019 PCP: Danella Penton, MD        Assessment/Plan:   Principal Problem:   Severe anemia Active Problems:   Insulin dependent type 2 diabetes mellitus (HCC)   CKD (chronic kidney disease) stage 3, GFR 30-59 ml/min   History of melena   Acute on chronic diastolic CHF (congestive heart failure) (HCC)   Subtherapeutic international normalized ratio (INR)   Atrial fibrillation, chronic (HCC)   Chronic anticoagulation   Acute kidney injury superimposed on CKD (HCC)   Melena   Body mass index is 33.58 kg/m.    Severe acute blood loss anemia/iron deficiency anemia: Hemoglobin improved after transfusion with 1 unit of blood yesterday.  S/p transfusion with 5 units total of packed red blood cells.  Continue IV iron sucrose. Monitor H&H.  Recent GI bleeding/melena: Patient is n.p.o. for endoscopy today.  Continue IV Protonix.  Hold Coumadin.  Plan for EGD today.  Follow-up with gastroenterologist for further recommendations.  AKI on CKD stage IIIa with metabolic acidosis: Improved.  Acute hypoxemic respiratory failure: Resolved.    Acute on chronic diastolic CHF: Patient had some difficulty breathing earlier this morning but this has resolved.  Chest x-ray that was done today showed mild pulmonary edema.  2D echo on 11/08/2019 showed EF of 60 to 65%, mild LVH, moderate MR, moderately elevated pulmonary artery systolic pressure  Chronic atrial fibrillation: Coumadin on hold.  Continue metoprolol  IDDM: Hold Metformin and insulin.  Monitor glucose levels closely.  NovoLog as needed for hyperglycemia.  Hypertension: Continue metoprolol  No evidence of sepsis thus far.   Family Communication/Anticipated D/C date and plan/Code Status   DVT prophylaxis: SCDs Code Status: Full code Family Communication: Discussed with the patient  disposition Plan: Possible discharge to home  tomorrow    Subjective:   Overnight events noted.  Patient had recent nonlabored breathing in the early hours of the morning requiring IV Lasix.  He feels much better now.  No shortness of breath, chest pain, dizziness, palpitations, hematemesis or black stools.  Objective:    Vitals:   11/11/19 1509 11/11/19 1519 11/11/19 1529 11/11/19 1628  BP: (!) 102/54 116/72 112/70 (!) 145/75  Pulse: 78 75 74 72  Resp: 13 12 10 17   Temp:    98.4 F (36.9 C)  TempSrc:    Oral  SpO2: 98% 98% 95% 97%  Weight:      Height:        Intake/Output Summary (Last 24 hours) at 11/11/2019 1630 Last data filed at 11/11/2019 1600 Gross per 24 hour  Intake 680.67 ml  Output 3400 ml  Net -2719.33 ml   Filed Weights   11/08/19 1143 11/11/19 0708 11/11/19 1312  Weight: 101.6 kg 108 kg 106.1 kg    Exam:  GEN: NAD SKIN: No rash EYES: anicteric, pale ENT: MMM CV: RRR PULM: CTA B ABD: soft, obese, NT, +BS CNS: AAO x 3, non focal EXT: No edema or tenderness     Data Reviewed:   I have personally reviewed following labs and imaging studies:  Labs: Labs show the following:   Basic Metabolic Panel: Recent Labs  Lab 11/08/19 0318 11/08/19 0327 11/09/19 0519 11/10/19 0340 11/11/19 0543  NA 140 139 142 136 138  K 4.6 4.6 4.5 4.5 4.3  CL 113* 112* 114* 109 107  CO2 15* 16* 20* 20* 21*  GLUCOSE 116* 116* 112* 162* 121*  BUN 34* 34* 27* 30* 17  CREATININE 1.81* 1.84* 1.42* 1.54* 1.31*  CALCIUM 8.4* 8.5* 8.3* 8.3* 8.7*   GFR Estimated Creatinine Clearance: 53.9 mL/min (A) (by C-G formula based on SCr of 1.31 mg/dL (H)). Liver Function Tests: Recent Labs  Lab 11/08/19 0327  AST 28  ALT 34  ALKPHOS 77  BILITOT 1.0  PROT 6.9  ALBUMIN 3.4*   No results for input(s): LIPASE, AMYLASE in the last 168 hours. No results for input(s): AMMONIA in the last 168 hours. Coagulation profile Recent Labs  Lab 11/08/19 0318  INR 1.5*    CBC: Recent Labs  Lab 11/08/19 0327  11/08/19 1607 11/09/19 0519 11/10/19 0340 11/10/19 1451 11/11/19 0543  WBC 7.7  --  6.2 6.0  --  6.7  NEUTROABS 5.6  --   --   --   --   --   HGB 3.9* 7.2* 7.4* 6.7* 7.5* 8.4*  HCT 13.6* 22.6* 23.2* 20.9* 22.5* 25.7*  MCV 109.7*  --  96.7 94.1  --  94.5  PLT 178  --  142* 128*  --  131*   Cardiac Enzymes: No results for input(s): CKTOTAL, CKMB, CKMBINDEX, TROPONINI in the last 168 hours. BNP (last 3 results) No results for input(s): PROBNP in the last 8760 hours. CBG: Recent Labs  Lab 11/10/19 0735 11/10/19 1137 11/10/19 1638 11/11/19 0758 11/11/19 1145  GLUCAP 148* 171* 137* 119* 124*   D-Dimer: No results for input(s): DDIMER in the last 72 hours. Hgb A1c: Recent Labs    11/08/19 2159  HGBA1C 6.7*   Lipid Profile: No results for input(s): CHOL, HDL, LDLCALC, TRIG, CHOLHDL, LDLDIRECT in the last 72 hours. Thyroid function studies: No results for input(s): TSH, T4TOTAL, T3FREE, THYROIDAB in the last 72 hours.  Invalid input(s): FREET3 Anemia work up: Recent Labs    11/08/19 2159  VITAMINB12 566  FERRITIN 13*   Sepsis Labs: Recent Labs  Lab 11/08/19 0327 11/09/19 0519 11/10/19 0340 11/11/19 0543  WBC 7.7 6.2 6.0 6.7    Microbiology Recent Results (from the past 240 hour(s))  SARS CORONAVIRUS 2 (TAT 6-24 HRS) Nasopharyngeal Nasopharyngeal Swab     Status: None   Collection Time: 11/08/19  5:32 AM   Specimen: Nasopharyngeal Swab  Result Value Ref Range Status   SARS Coronavirus 2 NEGATIVE NEGATIVE Final    Comment: (NOTE) SARS-CoV-2 target nucleic acids are NOT DETECTED. The SARS-CoV-2 RNA is generally detectable in upper and lower respiratory specimens during the acute phase of infection. Negative results do not preclude SARS-CoV-2 infection, do not rule out co-infections with other pathogens, and should not be used as the sole basis for treatment or other patient management decisions. Negative results must be combined with clinical  observations, patient history, and epidemiological information. The expected result is Negative. Fact Sheet for Patients: HairSlick.nohttps://www.fda.gov/media/138098/download Fact Sheet for Healthcare Providers: quierodirigir.comhttps://www.fda.gov/media/138095/download This test is not yet approved or cleared by the [place] FDA and  has been authorized for detection and/or diagnosis of SARS-CoV-2 by FDA under an Emergency Use Authorization (EUA). This EUA will remain  in effect (meaning this test can be used) for the duration of the COVID-19 declaration under Section 56 4(b)(1) of the Act, 21 U.S.C. section 360bbb-3(b)(1), unless the authorization is terminated or revoked sooner. Performed at [facility], [address] [place], [place] [address]     Procedures and diagnostic studies:  DG Chest 1 View  Result Date: 11/11/2019 CLINICAL DATA:  Shortness of breath and wheezing.  EXAM: CHEST  1 VIEW COMPARISON:  Radiograph 11/08/2019. CT 12/03/2018. FINDINGS: Post median sternotomy and CABG. Mild cardiomegaly. Atherosclerosis of the thoracic aorta. Vascular congestion with development of mild pulmonary edema from prior. No confluent airspace disease. No pleural effusion or pneumothorax. Degenerative change of the left shoulder. IMPRESSION: CHF. Cardiomegaly with worsening vascular congestion and development of mild pulmonary edema for the past 3 days. Electronically Signed   By: Keith Rake M.D.   On: 11/11/2019 03:03    Medications:   . [MAR Hold] sodium chloride   Intravenous Once  . [MAR Hold] brimonidine  1 drop Both Eyes BID  . [MAR Hold] buPROPion  150 mg Oral Daily  . [MAR Hold] Chlorhexidine Gluconate Cloth  6 each Topical Daily  . [MAR Hold] escitalopram  10 mg Oral Daily  . [MAR Hold] ferrous gluconate  324 mg Oral Q breakfast  . [MAR Hold] insulin aspart  0-5 Units Subcutaneous QHS  . [MAR Hold] insulin aspart  0-9 Units Subcutaneous TID WC  . [MAR Hold] latanoprost  1 drop Both  Eyes BID  . [MAR Hold] metoprolol succinate  25 mg Oral BID  . [MAR Hold] pantoprazole (PROTONIX) IV  40 mg Intravenous Q12H  . [MAR Hold] simvastatin  40 mg Oral q1800  . [MAR Hold] vitamin B-12  500 mcg Oral Daily   Continuous Infusions: . [MAR Hold] sodium chloride    . sodium chloride 300 mL/hr at 11/11/19 1454     LOS: 3 days   Tyrah Broers  [facility]   *Please refer to [email or web address], password [email or web address] to get updated schedule on who will round on this patient, as hospitalists switch teams weekly. If 7PM-7AM, please contact night-coverage at [email or web address], password [email or web address] for any overnight needs.  11/11/2019, 4:30 PM

## 2019-11-11 NOTE — Op Note (Signed)
[facility] Patient Name: Jeffrey Mills Procedure Date: 11/11/2019 1:51 PM MRN: [id number] Account #: [id number] Date of Birth: 16-Jun-1936 Admit Type: Inpatient Age: 83 Room: [facility] ENDO ROOM 3 Gender: Male Note Status: Finalized Procedure:             Upper GI endoscopy Indications:           Iron deficiency anemia secondary to chronic blood                         loss, Melena Providers:             Lin Landsman MD, MD Medicines:             Monitored Anesthesia Care Complications:         No immediate complications. Estimated blood loss: None. Procedure:             Pre-Anesthesia Assessment:                        - Prior to the procedure, a History and Physical was                         performed, and patient medications and allergies were                         reviewed. The patient is competent. The risks and                         benefits of the procedure and the sedation options and                         risks were discussed with the patient. All questions                         were answered and informed consent was obtained.                         Patient identification and proposed procedure were                         verified by the physician, the nurse, the                         anesthesiologist, the anesthetist and the technician                         in the pre-procedure area in the procedure room in the                         endoscopy suite. Mental Status Examination: alert and                         oriented. Airway Examination: normal oropharyngeal                         airway and neck mobility. Respiratory Examination:                         clear to auscultation. CV Examination: normal.  Prophylactic Antibiotics: The patient does not require                         prophylactic antibiotics. Prior Anticoagulants: The                         patient has taken Coumadin (warfarin),  last dose was 4                         days prior to procedure. ASA Grade Assessment: III - A                         patient with severe systemic disease. After reviewing                         the risks and benefits, the patient was deemed in                         satisfactory condition to undergo the procedure. The                         anesthesia plan was to use monitored anesthesia care                         (MAC). Immediately prior to administration of                         medications, the patient was re-assessed for adequacy                         to receive sedatives. The heart rate, respiratory                         rate, oxygen saturations, blood pressure, adequacy of                         pulmonary ventilation, and response to care were                         monitored throughout the procedure. The physical                         status of the patient was re-assessed after the                         procedure.                        After obtaining informed consent, the endoscope was                         passed under direct vision. Throughout the procedure,                         the patient's blood pressure, pulse, and oxygen                         saturations were monitored continuously. The Endoscope  was introduced through the mouth, and advanced to the                         second part of duodenum. The upper GI endoscopy was                         accomplished without difficulty. The patient tolerated                         the procedure well. Findings:      The duodenal bulb and second portion of the duodenum were normal.      A single 5 mm angioectasia with no bleeding was found on the greater       curvature of the gastric body. Coagulation for hemostasis using argon       plasma was successful.      The cardia and gastric fundus were normal on retroflexion.      The gastroesophageal junction and examined esophagus were  normal.      The exam was otherwise without abnormality. Impression:            - Normal duodenal bulb and second portion of the                         duodenum.                        - A single non-bleeding angioectasia in the stomach.                         Treated with argon plasma coagulation (APC).                        - Normal gastroesophageal junction and esophagus.                        - The examination was otherwise normal.                        - No specimens collected. Recommendation:        - Proceed with colonoscopy as scheduled                        See colonoscopy report Procedure Code(s):     --- Professional ---                        [phone], Esophagogastroduodenoscopy, flexible,                         transoral; with control of bleeding, any method Diagnosis Code(s):     --- Professional ---                        K31.819, Angiodysplasia of stomach and duodenum                         without bleeding                        D50.0, Iron deficiency anemia secondary to blood loss                         (  chronic)                        K92.1, Melena (includes Hematochezia) CPT copyright 2019 American Medical Association. All rights reserved. The codes documented in this report are preliminary and upon coder review may  be revised to meet current compliance requirements. Dr. Ulyess Mort Lin Landsman MD, MD 11/11/2019 2:35:15 PM This report has been signed electronically. Number of Addenda: 0 Note Initiated On: 11/11/2019 1:51 PM Estimated Blood Loss:  Estimated blood loss: none.      [facility]

## 2019-11-11 NOTE — Progress Notes (Signed)
Patient wheezing, labored breathing at times. 02 sats 95%, discussed with on call provider Rufina Falco NP, duoneb prn ordered and chest x ray.

## 2019-11-11 NOTE — Progress Notes (Signed)
Received order from Rufina Falco NP for one time order lasix 40mg .

## 2019-11-11 NOTE — Anesthesia Preprocedure Evaluation (Signed)
Anesthesia Evaluation  Patient identified by MRN, date of birth, ID band Patient awake    Reviewed: Allergy & Precautions, H&P , NPO status , Patient's Chart, lab work & pertinent test results  History of Anesthesia Complications Negative for: history of anesthetic complications  Airway Mallampati: III  TM Distance: <3 FB Neck ROM: limited    Dental  (+) Poor Dentition, Missing   Pulmonary neg pulmonary ROS, neg shortness of breath,           Cardiovascular hypertension, + CAD, + Past MI, + CABG, + Peripheral Vascular Disease and +CHF       Neuro/Psych PSYCHIATRIC DISORDERS negative neurological ROS     GI/Hepatic negative GI ROS, Neg liver ROS, neg GERD  ,  Endo/Other  diabetes, Type 2  Renal/GU CRFRenal disease  negative genitourinary   Musculoskeletal  (+) Arthritis ,   Abdominal   Peds  Hematology negative hematology ROS (+)   Anesthesia Other Findings Past Medical History: No date: Anemia No date: Anxiety No date: Arthritis No date: Benign hypertension No date: Cardiopulmonary arrest (HCC) No date: CHF (congestive heart failure) (HCC) No date: Coronary atherosclerosis of native coronary artery No date: Depression No date: Diabetes ([place]) No date: Glaucoma No date: Hyperlipidemia No date: Mitral valve disorder No date: Neuralgia No date: PVD (peripheral vascular disease) ([place])  Past Surgical History: No date: CHOLECYSTECTOMY No date: CORONARY ANGIOPLASTY WITH STENT PLACEMENT No date: CORONARY ARTERY BYPASS GRAFT No date: MITRAL VALVE ANNULOPLASTY 12/05/2018: TEE WITHOUT CARDIOVERSION; N/A     Comment:  Procedure: TRANSESOPHAGEAL ECHOCARDIOGRAM (TEE);                Surgeon: Teodoro Spray, MD;  Location: [facility] ORS;                Service: Cardiovascular;  Laterality: N/A;  BMI    Body Mass Index: 33.58 kg/m      Reproductive/Obstetrics negative OB ROS                              Anesthesia Physical Anesthesia Plan  ASA: III  Anesthesia Plan: General   Post-op Pain Management:    Induction: Intravenous  PONV Risk Score and Plan: Propofol infusion and TIVA  Airway Management Planned: Natural Airway and Nasal Cannula  Additional Equipment:   Intra-op Plan:   Post-operative Plan:   Informed Consent: I have reviewed the patients History and Physical, chart, labs and discussed the procedure including the risks, benefits and alternatives for the proposed anesthesia with the patient or authorized representative who has indicated his/her understanding and acceptance.     Dental Advisory Given  Plan Discussed with: Anesthesiologist, CRNA and Surgeon  Anesthesia Plan Comments: (Patient consented for risks of anesthesia including but not limited to:  - adverse reactions to medications - risk of intubation if required - damage to teeth, lips or other oral mucosa - sore throat or hoarseness - Damage to heart, brain, lungs or loss of life  Patient voiced understanding.)        Anesthesia Quick Evaluation

## 2019-11-11 NOTE — Plan of Care (Signed)

## 2019-11-11 NOTE — Op Note (Signed)
[facility] Patient Name: Jeffrey Mills Procedure Date: 11/11/2019 1:51 PM MRN: [id number] Account #: [id number] Date of Birth: August 09, 1936 Admit Type: Inpatient Age: 83 Room: [facility] ENDO ROOM 3 Gender: Male Note Status: Finalized Procedure:             Colonoscopy Indications:           Melena, Iron deficiency anemia secondary to chronic                         blood loss, Iron deficiency anemia Providers:             Lin Landsman MD, MD Medicines:             Monitored Anesthesia Care Complications:         No immediate complications. Estimated blood loss: None. Procedure:             Pre-Anesthesia Assessment:                        - Prior to the procedure, a History and Physical was                         performed, and patient medications and allergies were                         reviewed. The patient is competent. The risks and                         benefits of the procedure and the sedation options and                         risks were discussed with the patient. All questions                         were answered and informed consent was obtained.                         Patient identification and proposed procedure were                         verified by the physician, the nurse, the                         anesthesiologist, the anesthetist and the technician                         in the pre-procedure area in the procedure room in the                         endoscopy suite. Mental Status Examination: alert and                         oriented. Airway Examination: normal oropharyngeal                         airway and neck mobility. Respiratory Examination:                         clear to auscultation. CV Examination:  normal.                         Prophylactic Antibiotics: The patient does not require                         prophylactic antibiotics. Prior Anticoagulants: The                         patient has taken  Coumadin (warfarin), last dose was 4                         days prior to procedure. ASA Grade Assessment: III - A                         patient with severe systemic disease. After reviewing                         the risks and benefits, the patient was deemed in                         satisfactory condition to undergo the procedure. The                         anesthesia plan was to use monitored anesthesia care                         (MAC). Immediately prior to administration of                         medications, the patient was re-assessed for adequacy                         to receive sedatives. The heart rate, respiratory                         rate, oxygen saturations, blood pressure, adequacy of                         pulmonary ventilation, and response to care were                         monitored throughout the procedure. The physical                         status of the patient was re-assessed after the                         procedure.                        After obtaining informed consent, the colonoscope was                         passed under direct vision. Throughout the procedure,                         the patient's blood pressure, pulse, and oxygen  saturations were monitored continuously. The                         Colonoscope was introduced through the anus and                         advanced to the the cecum, identified by appendiceal                         orifice and ileocecal valve. The colonoscopy was                         performed with moderate difficulty due to inadequate                         bowel prep and significant looping. Successful                         completion of the procedure was aided by applying                         abdominal pressure. The patient tolerated the                         procedure well. The quality of the bowel preparation                         was fair. Findings:      The  perianal and digital rectal examinations were normal. Pertinent       negatives include normal sphincter tone and no palpable rectal lesions.      Multiple diverticula were found in the sigmoid colon and descending       colon.      Copious quantities of semi-liquid yellow stool was found in the entire       colon, interfering with visualization. Lavage of the area was performed       using 50 - 200 mL of sterile water, resulting in clearance with fair       visualization.      The entire examined colon appeared normal.      Normal mucosa was found in the entire colon. Impression:            - Preparation of the colon was fair.                        - Diverticulosis in the sigmoid colon and in the                         descending colon.                        - Stool in the entire examined colon.                        - The entire examined colon is normal.                        - Normal mucosa in the entire examined colon.                        -  No specimens collected. Recommendation:        - Return patient to hospital ward for ongoing care.                        - Cardiac diet and diabetic (ADA) diet today.                        - Resume Coumadin (warfarin) at prior dose today.                        - Return to GI office in 2 weeks.                        - To visualize the small bowel, perform video capsule                         endoscopy at appointment to be scheduled. Procedure Code(s):     --- Professional ---                        [phone], Colonoscopy, flexible; diagnostic, including                         collection of specimen(s) by brushing or washing, when                         performed (separate procedure) Diagnosis Code(s):     --- Professional ---                        K92.1, Melena (includes Hematochezia)                        D50.0, Iron deficiency anemia secondary to blood loss                         (chronic)                        D50.9, Iron  deficiency anemia, unspecified                        K57.30, Diverticulosis of large intestine without                         perforation or abscess without bleeding CPT copyright 2019 American Medical Association. All rights reserved. The codes documented in this report are preliminary and upon coder review may  be revised to meet current compliance requirements. Dr. Ulyess Mort Lin Landsman MD, MD 11/11/2019 2:53:57 PM This report has been signed electronically. Number of Addenda: 0 Note Initiated On: 11/11/2019 1:51 PM Scope Withdrawal Time: 0 hours 4 minutes 15 seconds  Total Procedure Duration: 0 hours 12 minutes 6 seconds  Estimated Blood Loss:  Estimated blood loss: none.      [facility]

## 2019-11-11 NOTE — Care Management Important Message (Signed)
Important Message  Patient Details  Name: Jeffrey Mills MRN: [id number] Date of Birth: Nov 19, 1936   Medicare Important Message Given:  Yes     Dannette Barbara 11/11/2019, 3:10 PM

## 2019-11-11 NOTE — Consult Note (Signed)
EGD and colonoscopy post procedure note  Nonbleeding gastric AVM, cauterized.  Colon was normal.  Recommend to resume Coumadin.  If rebleeds, recommend video capsule endoscopy as inpatient.  If hemoglobin is stable, can go home tomorrow and follow-up with Dr Bonna Gains as outpatient  Cephas Darby, MD [address]  [place]  [place], [place] [address]  Main: [phone]  Fax: [phone] Pager: [phone]

## 2019-11-11 NOTE — Transfer of Care (Signed)
Immediate Anesthesia Transfer of Care Note  Patient: Jonh Mcqueary [facility]  Procedure(s) Performed: COLONOSCOPY WITH PROPOFOL (N/A ) ESOPHAGOGASTRODUODENOSCOPY (EGD) WITH PROPOFOL (N/A )  Patient Location: PACU  Anesthesia Type:General  Level of Consciousness: sedated  Airway & Oxygen Therapy: Patient Spontanous Breathing and Patient connected to nasal cannula oxygen  Post-op Assessment: Report given to RN and Post -op Vital signs reviewed and stable  Post vital signs: Reviewed and stable  Last Vitals:  Vitals Value Taken Time  BP 109/74 11/11/19 1505  Temp    Pulse 77 11/11/19 1507  Resp 12 11/11/19 1507  SpO2 98 % 11/11/19 1507  Vitals shown include unvalidated device data.  Last Pain:  Vitals:   11/11/19 1459  TempSrc: Temporal  PainSc: Asleep         Complications: No apparent anesthesia complications

## 2019-11-12 ENCOUNTER — Encounter: Payer: Self-pay | Admitting: *Deleted

## 2019-11-12 LAB — BASIC METABOLIC PANEL
Anion gap: 9 (ref 5–15)
BUN: 13 mg/dL (ref 8–23)
CO2: 24 mmol/L (ref 22–32)
Calcium: 8.6 mg/dL — ABNORMAL LOW (ref 8.9–10.3)
Chloride: 104 mmol/L (ref 98–111)
Creatinine, Ser: 1.23 mg/dL (ref 0.61–1.24)
GFR calc Af Amer: >60 mL/min (ref >60)
GFR calc non Af Amer: 55 mL/min — ABNORMAL LOW (ref >60)
Glucose, Bld: 113 mg/dL — ABNORMAL HIGH (ref 70–99)
Potassium: 3.9 mmol/L (ref 3.5–5.1)
Sodium: 137 mmol/L (ref 135–145)

## 2019-11-12 LAB — GLUCOSE, CAPILLARY
Glucose-Capillary: 135 mg/dL — ABNORMAL HIGH (ref 70–99)
Glucose-Capillary: 193 mg/dL — ABNORMAL HIGH (ref 70–99)
Glucose-Capillary: 107 mg/dL — ABNORMAL HIGH (ref 70–99)
Glucose-Capillary: 155 mg/dL — ABNORMAL HIGH (ref 70–99)

## 2019-11-12 LAB — CBC
HCT: 24.7 % — ABNORMAL LOW (ref 39.0–52.0)
Hemoglobin: 7.9 g/dL — ABNORMAL LOW (ref 13.0–17.0)
MCH: 30.9 pg (ref 26.0–34.0)
MCHC: 32 g/dL (ref 30.0–36.0)
MCV: 96.5 fL (ref 80.0–100.0)
Platelets: 118 10*3/uL — ABNORMAL LOW (ref 150–400)
RBC: 2.56 MIL/uL — ABNORMAL LOW (ref 4.22–5.81)
RDW: 18.2 % — ABNORMAL HIGH (ref 11.5–15.5)
WBC: 5.9 10*3/uL (ref 4.0–10.5)
nRBC: 0.3 % — ABNORMAL HIGH (ref 0.0–0.2)

## 2019-11-12 LAB — PROTIME-INR
INR: 1.3 — ABNORMAL HIGH (ref 0.8–1.2)
Prothrombin Time: 16.5 seconds — ABNORMAL HIGH (ref 11.4–15.2)

## 2019-11-12 MED ORDER — FUROSEMIDE 20 MG PO TABS
20.0000 mg | ORAL_TABLET | Freq: Every day | ORAL | Status: DC
  Administered 2019-11-12: 20 mg via ORAL
  Filled 2019-11-12: qty 1

## 2019-11-12 MED ORDER — SODIUM CHLORIDE 0.9 % IV SOLN
200.0000 mg | Freq: Once | INTRAVENOUS | Status: AC
  Administered 2019-11-12: 200 mg via INTRAVENOUS
  Filled 2019-11-12: qty 10

## 2019-11-12 MED ORDER — WARFARIN SODIUM 10 MG PO TABS
10.0000 mg | ORAL_TABLET | Freq: Once | ORAL | Status: DC
  Filled 2019-11-12: qty 1

## 2019-11-12 MED ORDER — FUROSEMIDE 20 MG PO TABS: 20.0000 mg | ORAL_TABLET | ORAL | 0 refills | Status: DC

## 2019-11-12 NOTE — Progress Notes (Signed)
Pt for discharge home. A/o. No distress. Instructions given. meds diet activity and f/u.  Sl d/cd.  No c/o out via w/c

## 2019-11-12 NOTE — Consult Note (Signed)
[place] for Warfarin Indication: atrial fibrillation  Allergies  Allergen Reactions  . Captopril Other (See Comments)    Reaction: unknown  . Morphine And Related Other (See Comments)    Reaction: unknown  . Penicillins Other (See Comments)    Reaction: unknown Did it involve swelling of the face/tongue/throat, SOB, or low BP? Unknown Did it involve sudden or severe rash/hives, skin peeling, or any reaction on the inside of your mouth or nose? Unknown Did you need to seek medical attention at a hospital or doctor's office? Unknown When did it last happen?unknown If all above answers are "NO", may proceed with cephalosporin use.     Patient Measurements: Height: 5\' 10"  (177.8 cm) Weight: 235 lb 10.8 oz (106.9 kg) IBW/kg (Calculated) : 73   Vital Signs: Temp: 98.8 F (37.1 C) (12/14 2358) BP: 143/69 (12/14 2358) Pulse Rate: 65 (12/14 2358)  Labs: Recent Labs    11/10/19 0340 11/10/19 1451 11/11/19 0543 11/11/19 1711 11/12/19 0452  HGB 6.7* 7.5* 8.4*  --  7.9*  HCT 20.9* 22.5* 25.7*  --  24.7*  PLT 128*  --  131*  --  118*  LABPROT  --   --   --  17.0* 16.5*  INR  --   --   --  1.4* 1.3*  CREATININE 1.54*  --  1.31*  --  1.23    Estimated Creatinine Clearance: 57.7 mL/min (by C-G formula based on SCr of 1.23 mg/dL).   Medications:  Warfarin 10 mg Tuesday and Thursday and 7.5 mg all other days- last dose 12/10; warfarin regimen confirmed with patient.   Assessment: Patient admitted for GI bleed and warfarin has been held. Per GI, patient can resume warfarin and this was confirmed with GI MD.    Platelets dropping since admission 178>142>128>>118  Today's INR: subtherapeutic   Date INR Dose 12/14 1.4 7.5 mg  12/15   1.3       10 mg  Goal of Therapy:  INR 2-3 Monitor platelets by anticoagulation protocol: Yes   Plan:  Will order warfarin 10 mg x1 dose for tonight. Will check INR daily and CBC every 3 days,  unless otherwise indicated frequently.   Lu Duffel, PharmD, BCPS Clinical Pharmacist 11/12/2019 7:26 AM

## 2019-11-12 NOTE — Discharge Instructions (Signed)
Anemia  Anemia is a condition in which you do not have enough red blood cells or hemoglobin. Hemoglobin is a substance in red blood cells that carries oxygen. When you do not have enough red blood cells or hemoglobin (are anemic), your body cannot get enough oxygen and your organs may not work properly. As a result, you may feel very tired or have other problems. What are the causes? Common causes of anemia include:  Excessive bleeding. Anemia can be caused by excessive bleeding inside or outside the body, including bleeding from the intestine or from periods in women.  Poor nutrition.  Long-lasting (chronic) kidney, thyroid, and liver disease.  Bone marrow disorders.  Cancer and treatments for cancer.  HIV (human immunodeficiency virus) and AIDS (acquired immunodeficiency syndrome).  Treatments for HIV and AIDS.  Spleen problems.  Blood disorders.  Infections, medicines, and autoimmune disorders that destroy red blood cells. What are the signs or symptoms? Symptoms of this condition include:  Minor weakness.  Dizziness.  Headache.  Feeling heartbeats that are irregular or faster than normal (palpitations).  Shortness of breath, especially with exercise.  Paleness.  Cold sensitivity.  Indigestion.  Nausea.  Difficulty sleeping.  Difficulty concentrating. Symptoms may occur suddenly or develop slowly. If your anemia is mild, you may not have symptoms. How is this diagnosed? This condition is diagnosed based on:  Blood tests.  Your medical history.  A physical exam.  Bone marrow biopsy. Your health care provider may also check your stool (feces) for blood and may do additional testing to look for the cause of your bleeding. You may also have other tests, including:  Imaging tests, such as a CT scan or MRI.  Endoscopy.  Colonoscopy. How is this treated? Treatment for this condition depends on the cause. If you continue to lose a lot of blood, you may  need to be treated at a hospital. Treatment may include:  Taking supplements of iron, vitamin M08, or folic acid.  Taking a hormone medicine (erythropoietin) that can help to stimulate red blood cell growth.  Having a blood transfusion. This may be needed if you lose a lot of blood.  Making changes to your diet.  Having surgery to remove your spleen. Follow these instructions at home:  Take over-the-counter and prescription medicines only as told by your health care provider.  Take supplements only as told by your health care provider.  Follow any diet instructions that you were given.  Keep all follow-up visits as told by your health care provider. This is important. Contact a health care provider if:  You develop new bleeding anywhere in the body. Get help right away if:  You are very weak.  You are short of breath.  You have pain in your abdomen or chest.  You are dizzy or feel faint.  You have trouble concentrating.  You have bloody or black, tarry stools.  You vomit repeatedly or you vomit up blood. Summary  Anemia is a condition in which you do not have enough red blood cells or enough of a substance in your red blood cells that carries oxygen (hemoglobin).  Symptoms may occur suddenly or develop slowly.  If your anemia is mild, you may not have symptoms.  This condition is diagnosed with blood tests as well as a medical history and physical exam. Other tests may be needed.  Treatment for this condition depends on the cause of the anemia. This information is not intended to replace advice given to you by  your health care provider. Make sure you discuss any questions you have with your health care provider. °Document Released: 12/22/2004 Document Revised: 10/27/2017 Document Reviewed: 12/16/2016 °Elsevier Patient Education © 2020 [employer] ° °

## 2019-11-12 NOTE — Progress Notes (Signed)
Patient Name: Jeffrey Mills Date of Encounter: 11/12/2019  Hospital Problem List     Principal Problem:   Severe anemia Active Problems:   Insulin dependent type 2 diabetes mellitus (HCC)   CKD (chronic kidney disease) stage 3, GFR 30-59 ml/min   History of melena   Acute on chronic diastolic CHF (congestive heart failure) (HCC)   Subtherapeutic international normalized ratio (INR)   Atrial fibrillation, chronic (HCC)   Chronic anticoagulation   Acute kidney injury superimposed on CKD [facility])   Melena    Patient Profile     83 y.o. male with history of cad s/p cabg x 4 with mvr in 1999, pci of m1 2010, htn, hyperlipidemia, dm and afib who was admitted with anemia. Underwent transfusion and EGD/Colon. A single 5 mm angioectasia in the antrum  with no bleeding was found and treated. Diverticulosis was found in the colon. No further bleeding.   Subjective   Resting comfortably.   Inpatient Medications    . sodium chloride   Intravenous Once  . brimonidine  1 drop Both Eyes BID  . buPROPion  150 mg Oral Daily  . Chlorhexidine Gluconate Cloth  6 each Topical Daily  . escitalopram  10 mg Oral Daily  . ferrous gluconate  324 mg Oral Q breakfast  . furosemide  20 mg Oral Daily  . insulin aspart  0-5 Units Subcutaneous QHS  . insulin aspart  0-9 Units Subcutaneous TID WC  . latanoprost  1 drop Both Eyes BID  . metoprolol succinate  25 mg Oral BID  . pantoprazole (PROTONIX) IV  40 mg Intravenous Q12H  . simvastatin  40 mg Oral q1800  . vitamin B-12  500 mcg Oral Daily  . warfarin  10 mg Oral ONCE-1800  . Warfarin - Pharmacist Dosing Inpatient   Does not apply q1800    Vital Signs    Vitals:   11/11/19 1628 11/11/19 1953 11/11/19 2358 11/12/19 0423  BP: (!) 145/75 140/71 (!) 143/69   Pulse: 72 67 65   Resp: 17  16   Temp: 98.4 F (36.9 C) 98.1 F (36.7 C) 98.8 F (37.1 C)   TempSrc: Oral     SpO2: 97% 95% 97%   Weight:    106.9 kg  Height:         Intake/Output Summary (Last 24 hours) at 11/12/2019 0859 Last data filed at 11/11/2019 2239 Gross per 24 hour  Intake 600.67 ml  Output 975 ml  Net -374.33 ml   Filed Weights   11/11/19 0708 11/11/19 1312 11/12/19 0423  Weight: 108 kg 106.1 kg 106.9 kg    Physical Exam    GEN: Well nourished, well developed, in no acute distress.  HEENT: normal.  Neck: Supple, no JVD, carotid bruits, or masses. Cardiac: RRR, no murmurs, rubs, or gallops. No clubbing, cyanosis, edema.  Radials/DP/PT 2+ and equal bilaterally.  Respiratory:  Respirations regular and unlabored, clear to auscultation bilaterally. GI: Soft, nontender, nondistended, BS + x 4. MS: no deformity or atrophy. Skin: warm and dry, no rash. Neuro:  Strength and sensation are intact. Psych: Normal affect.  Labs    CBC Recent Labs    11/11/19 0543 11/12/19 0452  WBC 6.7 5.9  HGB 8.4* 7.9*  HCT 25.7* 24.7*  MCV 94.5 96.5  PLT 131* 118*   Basic Metabolic Panel Recent Labs    16/10/96 0543 11/12/19 0452  NA 138 137  K 4.3 3.9  CL 107 104  CO2 21*  24  GLUCOSE 121* 113*  BUN 17 13  CREATININE 1.31* 1.23  CALCIUM 8.7* 8.6*   Liver Function Tests No results for input(s): AST, ALT, ALKPHOS, BILITOT, PROT, ALBUMIN in the last 72 hours. No results for input(s): LIPASE, AMYLASE in the last 72 hours. Cardiac Enzymes No results for input(s): CKTOTAL, CKMB, CKMBINDEX, TROPONINI in the last 72 hours. BNP No results for input(s): BNP in the last 72 hours. D-Dimer No results for input(s): DDIMER in the last 72 hours. Hemoglobin A1C No results for input(s): HGBA1C in the last 72 hours. Fasting Lipid Panel No results for input(s): CHOL, HDL, LDLCALC, TRIG, CHOLHDL, LDLDIRECT in the last 72 hours. Thyroid Function Tests No results for input(s): TSH, T4TOTAL, T3FREE, THYROIDAB in the last 72 hours.  Invalid input(s): FREET3  Telemetry    nsr  ECG   nsr  Radiology    DG Chest 1 View  Result Date:  11/11/2019 CLINICAL DATA:  Shortness of breath and wheezing. EXAM: CHEST  1 VIEW COMPARISON:  Radiograph 11/08/2019. CT 12/03/2018. FINDINGS: Post median sternotomy and CABG. Mild cardiomegaly. Atherosclerosis of the thoracic aorta. Vascular congestion with development of mild pulmonary edema from prior. No confluent airspace disease. No pleural effusion or pneumothorax. Degenerative change of the left shoulder. IMPRESSION: CHF. Cardiomegaly with worsening vascular congestion and development of mild pulmonary edema for the past 3 days. Electronically Signed   By: Narda Rutherford M.D.   On: 11/11/2019 03:03   DG Chest Portable 1 View  Result Date: 11/08/2019 CLINICAL DATA:  Shortness of breath EXAM: PORTABLE CHEST 1 VIEW COMPARISON:  December 06, 2018 FINDINGS: The heart size is enlarged. The patient is status post prior median sternotomy. There is no pneumothorax. There is vascular congestion. There are developing perihilar airspace opacities. There is no pneumothorax. IMPRESSION: Cardiomegaly with findings of developing congestive heart failure. Electronically Signed   By: Katherine Mantle M.D.   On: 11/08/2019 03:42   ECHOCARDIOGRAM COMPLETE  Result Date: 11/08/2019   ECHOCARDIOGRAM REPORT   Patient Name:   Jeffrey Mills [facility] Date of Exam: 11/08/2019 Medical Rec #:  [id number]         Height:       70.0 in Accession #:    [id number]        Weight:       224.0 lb Date of Birth:  21-Nov-1936          BSA:          2.19 m Patient Age:    83 years          BP:           138/76 mmHg Patient Gender: M                 HR:           66 bpm. Exam Location:  [facility] Procedure: 2D Echo, Cardiac Doppler and Color Doppler Indications:     CHF- acute diastolic 428.31  History:         Patient has prior history of Echocardiogram examinations, most                  recent 12/05/2018. CHF; Risk Factors:Diabetes and Hypertension.                  Mitral valve disorder, PVD.  Sonographer:     Cristela Blue RDCS (AE) Referring Phys:   [id number] Andris Baumann Diagnosing Phys: Jeffrey Hedge MD IMPRESSIONS  1. Left ventricular ejection  fraction, by visual estimation, is 60 to 65%. The left ventricle has normal function. Left ventricular septal wall thickness was mildly increased. Mildly increased left ventricular posterior wall thickness. There is mildly increased left ventricular hypertrophy.  2. The left ventricle has no regional wall motion abnormalities.  3. Global right ventricle has normal systolic function.The right ventricular size is mildly enlarged. No increase in right ventricular wall thickness.  4. Left atrial size was mildly dilated.  5. Right atrial size was normal.  6. The mitral valve was not well visualized. Moderate mitral valve regurgitation.  7. The tricuspid valve is not well visualized. Tricuspid valve regurgitation is trivial.  8. The aortic valve was not well visualized. Aortic valve regurgitation is trivial. Mild to moderate aortic valve sclerosis/calcification without any evidence of aortic stenosis.  9. The pulmonic valve was not well visualized. Pulmonic valve regurgitation is trivial. 10. The aortic root was not well visualized. 11. Moderately elevated pulmonary artery systolic pressure. 12. The atrial septum is grossly normal. FINDINGS  Left Ventricle: Left ventricular ejection fraction, by visual estimation, is 60 to 65%. The left ventricle has normal function. The left ventricle has no regional wall motion abnormalities. Mildly increased left ventricular posterior wall thickness. There is mildly increased left ventricular hypertrophy. Right Ventricle: The right ventricular size is mildly enlarged. No increase in right ventricular wall thickness. Global RV systolic function is has normal systolic function. The tricuspid regurgitant velocity is 2.74 m/s, and with an assumed right atrial  pressure of 10 mmHg, the estimated right ventricular systolic pressure is moderately elevated at 40.0 mmHg. Left Atrium: Left atrial  size was mildly dilated. Right Atrium: Right atrial size was normal in size Pericardium: There is no evidence of pericardial effusion. Mitral Valve: The mitral valve was not well visualized. Moderate mitral valve regurgitation. MV peak gradient, 18.5 mmHg. Tricuspid Valve: The tricuspid valve is not well visualized. Tricuspid valve regurgitation is trivial. Aortic Valve: The aortic valve was not well visualized. Aortic valve regurgitation is trivial. Mild to moderate aortic valve sclerosis/calcification is present, without any evidence of aortic stenosis. Aortic valve mean gradient measures 5.5 mmHg. Aortic  valve peak gradient measures 9.1 mmHg. Aortic valve area, by VTI measures 2.53 cm. Pulmonic Valve: The pulmonic valve was not well visualized. Pulmonic valve regurgitation is trivial. Pulmonic regurgitation is trivial. Aorta: The aortic root was not well visualized. IAS/Shunts: The atrial septum is grossly normal.  LEFT VENTRICLE PLAX 2D LVIDd:         4.42 cm  Diastology LVIDs:         2.92 cm  LV e' lateral:   6.96 cm/s LV PW:         1.08 cm  LV E/e' lateral: 27.3 LV IVS:        1.83 cm  LV e' medial:    5.33 cm/s LVOT diam:     2.00 cm  LV E/e' medial:  35.6 LV SV:         56 ml LV SV Index:   24.60 LVOT Area:     3.14 cm  RIGHT VENTRICLE RV Basal diam:  4.61 cm RV S prime:     8.05 cm/s TAPSE (M-mode): 2.8 cm LEFT ATRIUM              Index       RIGHT ATRIUM           Index LA diam:        4.40 cm  2.01 cm/m  RA Area:     28.90 cm LA Vol (A2C):   107.0 ml 48.85 ml/m RA Volume:   106.00 ml 48.39 ml/m LA Vol (A4C):   74.5 ml  34.01 ml/m LA Biplane Vol: 92.9 ml  42.41 ml/m  AORTIC VALVE                    PULMONIC VALVE AV Area (Vmax):    2.11 cm     PV Vmax:        0.59 m/s AV Area (Vmean):   1.96 cm     PV Peak grad:   1.4 mmHg AV Area (VTI):     2.53 cm     RVOT Peak grad: 2 mmHg AV Vmax:           150.50 cm/s AV Vmean:          106.150 cm/s AV VTI:            0.312 m AV Peak Grad:      9.1 mmHg  AV Mean Grad:      5.5 mmHg LVOT Vmax:         101.00 cm/s LVOT Vmean:        66.300 cm/s LVOT VTI:          0.251 m LVOT/AV VTI ratio: 0.80  AORTA Ao Root diam: 3.10 cm MITRAL VALVE                         TRICUSPID VALVE MV Area (PHT): 2.29 cm              TR Peak grad:   30.0 mmHg MV Peak grad:  18.5 mmHg             TR Vmax:        309.00 cm/s MV Mean grad:  6.0 mmHg MV Vmax:       2.15 m/s              SHUNTS MV Vmean:      104.0 cm/s            Systemic VTI:  0.25 m MV VTI:        0.63 m                Systemic Diam: 2.00 cm MV PHT:        95.99 msec MV Decel Time: 331 msec MV E velocity: 190.00 cm/s 103 cm/s MV A velocity: 104.00 cm/s 70.3 cm/s MV E/A ratio:  1.83        1.5  Jeffrey HedgeKenneth Cyncere Sontag MD Electronically signed by Jeffrey HedgeKenneth Stefon Ramthun MD Signature Date/Time: 11/08/2019/2:37:58 PM    Final     Assessment & Plan    83 year old male with history of coronary artery disease status post coronary artery bypass grafting x4 with a mitral valve valvul[facility] in 1999, history of PCI of the OM1 in 2010, hypertension, diabetes, atrial fibrillation anticoagulated with warfarin who presented with shortness of breath and fatigue.  He was noted to be severely anemic with a hemoglobin of 3.9.  He also had acute on chronic renal insufficiency.  EKG showed sinus rhythm with no ischemia.  Chest x-ray was read as showing possible mild pulmonary edema.    CAD-stable with no angina. Did ok with gi procedures. Would continue with preadmission meds.  AFIB-currently in nsr. No further bleeding. No sources of conitnued bleeding in egd/colon. WIll need guidance from gi  regarding restarting warfarin. Pt was sub therapeutic on admission.   CHF-no clinical chf at present. Likely secondary to anemia. Echocardiogram done during this admission revealed again preserved LV function with no significant structural abnormalities.  He had moderate MR    Signed, Javier Docker. Kaleesi Guyton MD 11/12/2019, 8:59 AM  Pager: [phone]  [phone]

## 2019-11-12 NOTE — Anesthesia Postprocedure Evaluation (Signed)
Anesthesia Post Note  Patient: Jeffrey Mills [facility]  Procedure(s) Performed: COLONOSCOPY WITH PROPOFOL (N/A ) ESOPHAGOGASTRODUODENOSCOPY (EGD) WITH PROPOFOL (N/A )  Patient location during evaluation: Endoscopy Anesthesia Type: General Level of consciousness: awake and alert Pain management: pain level controlled Vital Signs Assessment: post-procedure vital signs reviewed and stable Respiratory status: spontaneous breathing, nonlabored ventilation, respiratory function stable and patient connected to nasal cannula oxygen Cardiovascular status: blood pressure returned to baseline and stable Postop Assessment: no apparent nausea or vomiting Anesthetic complications: no     Last Vitals:  Vitals:   11/11/19 1953 11/11/19 2358  BP: 140/71 (!) 143/69  Pulse: 67 65  Resp:  16  Temp: 36.7 C 37.1 C  SpO2: 95% 97%    Last Pain:  Vitals:   11/11/19 2012  TempSrc:   PainSc: 0-No pain                 Martha Clan

## 2019-11-12 NOTE — Discharge Summary (Signed)
Physician Discharge Summary  Jeffrey LennoxLarry W Mills UXN:[id number] DOB: 08/21/1936 DOA: 11/08/2019  PCP: Danella PentonMiller, Mark F, MD  Admit date: 11/08/2019 Discharge date: 11/12/2019  Discharge disposition: Home   Recommendations for Outpatient Follow-Up:   Outpatient follow-up with PCP and gastroenterologist   Discharge Diagnosis:   Principal Problem:   Severe anemia Active Problems:   Insulin dependent type 2 diabetes mellitus (HCC)   CKD (chronic kidney disease) stage 3, GFR 30-59 ml/min   History of melena   Acute on chronic diastolic CHF (congestive heart failure) (HCC)   Subtherapeutic international normalized ratio (INR)   Atrial fibrillation, chronic (HCC)   Chronic anticoagulation   Acute kidney injury superimposed on CKD (HCC)   Melena    Discharge Condition: Stable.  Diet recommendation: low salt, low glucose diet  Code status: Full code.    Hospital Course:   Jeffrey Mills is an 83 year old man with medical history significant for hypertension, insulin-dependent diabetes mellitus, CAD with history of CABG, atrial fibrillation on warfarin, obesity (BMI 33.8) who presented to the hospital with increasing shortness of breath and generalized weakness.  He also reported having black stools a few days prior to admission.  He was found to have severe acute blood loss anemia with a hemoglobin of 3.9 and acute exacerbation of chronic diastolic CHF and stated by severe annual.  He was treated with IV Protonix, IV Lasix and he was transfused with a total of 5 units of packed red blood cells.  He was also treated with IV Venofer.  He was seen by cardiologist for preoperative clearance.  He was also seen in consultation by the gastroenterologist and he underwent EGD and colonoscopy on 11/11/2019.  EGD showed a single nonbleeding angiectasia was treated with argon plasma coagulation and colonoscopy showed diverticulosis.  His condition has improved and is deemed stable for discharge  to home today.      Discharge Exam:   Vitals:   11/11/19 1953 11/11/19 2358  BP: 140/71 (!) 143/69  Pulse: 67 65  Resp:  16  Temp: 98.1 F (36.7 C) 98.8 F (37.1 C)  SpO2: 95% 97%   Vitals:   11/11/19 1628 11/11/19 1953 11/11/19 2358 11/12/19 0423  BP: (!) 145/75 140/71 (!) 143/69   Pulse: 72 67 65   Resp: 17  16   Temp: 98.4 F (36.9 C) 98.1 F (36.7 C) 98.8 F (37.1 C)   TempSrc: Oral     SpO2: 97% 95% 97%   Weight:    106.9 kg  Height:         GEN: NAD SKIN: No rash EYES: EOMI ENT: MMM CV: RRR PULM: CTA B ABD: soft, obese, NT, +BS CNS: AAO x 3, non focal EXT: No edema or tenderness   The results of significant diagnostics from this hospitalization (including imaging, microbiology, ancillary and laboratory) are listed below for reference.     Procedures and Diagnostic Studies:   DG Chest Portable 1 View  Result Date: 11/08/2019 CLINICAL DATA:  Shortness of breath EXAM: PORTABLE CHEST 1 VIEW COMPARISON:  December 06, 2018 FINDINGS: The heart size is enlarged. The patient is status post prior median sternotomy. There is no pneumothorax. There is vascular congestion. There are developing perihilar airspace opacities. There is no pneumothorax. IMPRESSION: Cardiomegaly with findings of developing congestive heart failure. Electronically Signed   By: Katherine Mantlehristopher  Green M.D.   On: 11/08/2019 03:42   ECHOCARDIOGRAM COMPLETE  Result Date: 11/08/2019   ECHOCARDIOGRAM REPORT   Patient Name:  Jeffrey Mills Date of Exam: 11/08/2019 Medical Rec #:  [id number]         Height:       70.0 in Accession #:    [id number]        Weight:       224.0 lb Date of Birth:  February 17, 1936          BSA:          2.19 m Patient Age:    83 years          BP:           138/76 mmHg Patient Gender: M                 HR:           66 bpm. Exam Location:  [facility] Procedure: 2D Echo, Cardiac Doppler and Color Doppler Indications:     CHF- acute diastolic 428.31  History:         Patient has prior  history of Echocardiogram examinations, most                  recent 12/05/2018. CHF; Risk Factors:Diabetes and Hypertension.                  Mitral valve disorder, PVD.  Sonographer:     Cristela Blue RDCS (AE) Referring Phys:  [id number] Andris Baumann Diagnosing Phys: Harold Hedge MD IMPRESSIONS  1. Left ventricular ejection fraction, by visual estimation, is 60 to 65%. The left ventricle has normal function. Left ventricular septal wall thickness was mildly increased. Mildly increased left ventricular posterior wall thickness. There is mildly increased left ventricular hypertrophy.  2. The left ventricle has no regional wall motion abnormalities.  3. Global right ventricle has normal systolic function.The right ventricular size is mildly enlarged. No increase in right ventricular wall thickness.  4. Left atrial size was mildly dilated.  5. Right atrial size was normal.  6. The mitral valve was not well visualized. Moderate mitral valve regurgitation.  7. The tricuspid valve is not well visualized. Tricuspid valve regurgitation is trivial.  8. The aortic valve was not well visualized. Aortic valve regurgitation is trivial. Mild to moderate aortic valve sclerosis/calcification without any evidence of aortic stenosis.  9. The pulmonic valve was not well visualized. Pulmonic valve regurgitation is trivial. 10. The aortic root was not well visualized. 11. Moderately elevated pulmonary artery systolic pressure. 12. The atrial septum is grossly normal. FINDINGS  Left Ventricle: Left ventricular ejection fraction, by visual estimation, is 60 to 65%. The left ventricle has normal function. The left ventricle has no regional wall motion abnormalities. Mildly increased left ventricular posterior wall thickness. There is mildly increased left ventricular hypertrophy. Right Ventricle: The right ventricular size is mildly enlarged. No increase in right ventricular wall thickness. Global RV systolic function is has normal systolic  function. The tricuspid regurgitant velocity is 2.74 m/s, and with an assumed right atrial  pressure of 10 mmHg, the estimated right ventricular systolic pressure is moderately elevated at 40.0 mmHg. Left Atrium: Left atrial size was mildly dilated. Right Atrium: Right atrial size was normal in size Pericardium: There is no evidence of pericardial effusion. Mitral Valve: The mitral valve was not well visualized. Moderate mitral valve regurgitation. MV peak gradient, 18.5 mmHg. Tricuspid Valve: The tricuspid valve is not well visualized. Tricuspid valve regurgitation is trivial. Aortic Valve: The aortic valve was not well visualized. Aortic valve regurgitation is trivial. Mild to moderate aortic valve  sclerosis/calcification is present, without any evidence of aortic stenosis. Aortic valve mean gradient measures 5.5 mmHg. Aortic  valve peak gradient measures 9.1 mmHg. Aortic valve area, by VTI measures 2.53 cm. Pulmonic Valve: The pulmonic valve was not well visualized. Pulmonic valve regurgitation is trivial. Pulmonic regurgitation is trivial. Aorta: The aortic root was not well visualized. IAS/Shunts: The atrial septum is grossly normal.  LEFT VENTRICLE PLAX 2D LVIDd:         4.42 cm  Diastology LVIDs:         2.92 cm  LV e' lateral:   6.96 cm/s LV PW:         1.08 cm  LV E/e' lateral: 27.3 LV IVS:        1.83 cm  LV e' medial:    5.33 cm/s LVOT diam:     2.00 cm  LV E/e' medial:  35.6 LV SV:         56 ml LV SV Index:   24.60 LVOT Area:     3.14 cm  RIGHT VENTRICLE RV Basal diam:  4.61 cm RV S prime:     8.05 cm/s TAPSE (M-mode): 2.8 cm LEFT ATRIUM              Index       RIGHT ATRIUM           Index LA diam:        4.40 cm  2.01 cm/m  RA Area:     28.90 cm LA Vol (A2C):   107.0 ml 48.85 ml/m RA Volume:   106.00 ml 48.39 ml/m LA Vol (A4C):   74.5 ml  34.01 ml/m LA Biplane Vol: 92.9 ml  42.41 ml/m  AORTIC VALVE                    PULMONIC VALVE AV Area (Vmax):    2.11 cm     PV Vmax:        0.59 m/s AV  Area (Vmean):   1.96 cm     PV Peak grad:   1.4 mmHg AV Area (VTI):     2.53 cm     RVOT Peak grad: 2 mmHg AV Vmax:           150.50 cm/s AV Vmean:          106.150 cm/s AV VTI:            0.312 m AV Peak Grad:      9.1 mmHg AV Mean Grad:      5.5 mmHg LVOT Vmax:         101.00 cm/s LVOT Vmean:        66.300 cm/s LVOT VTI:          0.251 m LVOT/AV VTI ratio: 0.80  AORTA Ao Root diam: 3.10 cm MITRAL VALVE                         TRICUSPID VALVE MV Area (PHT): 2.29 cm              TR Peak grad:   30.0 mmHg MV Peak grad:  18.5 mmHg             TR Vmax:        309.00 cm/s MV Mean grad:  6.0 mmHg MV Vmax:       2.15 m/s              SHUNTS MV  Vmean:      104.0 cm/s            Systemic VTI:  0.25 m MV VTI:        0.63 m                Systemic Diam: 2.00 cm MV PHT:        95.99 msec MV Decel Time: 331 msec MV E velocity: 190.00 cm/s 103 cm/s MV A velocity: 104.00 cm/s 70.3 cm/s MV E/A ratio:  1.83        1.5  Harold Hedge MD Electronically signed by Harold Hedge MD Signature Date/Time: 11/08/2019/2:37:58 PM    Final      Labs:   Basic Metabolic Panel: Recent Labs  Lab 11/08/19 0327 11/09/19 0519 11/10/19 0340 11/11/19 0543 11/12/19 0452  NA 139 142 136 138 137  K 4.6 4.5 4.5 4.3 3.9  CL 112* 114* 109 107 104  CO2 16* 20* 20* 21* 24  GLUCOSE 116* 112* 162* 121* 113*  BUN 34* 27* 30* 17 13  CREATININE 1.84* 1.42* 1.54* 1.31* 1.23  CALCIUM 8.5* 8.3* 8.3* 8.7* 8.6*   GFR Estimated Creatinine Clearance: 57.7 mL/min (by C-G formula based on SCr of 1.23 mg/dL). Liver Function Tests: Recent Labs  Lab 11/08/19 0327  AST 28  ALT 34  ALKPHOS 77  BILITOT 1.0  PROT 6.9  ALBUMIN 3.4*   No results for input(s): LIPASE, AMYLASE in the last 168 hours. No results for input(s): AMMONIA in the last 168 hours. Coagulation profile Recent Labs  Lab 11/08/19 0318 11/11/19 1711 11/12/19 0452  INR 1.5* 1.4* 1.3*    CBC: Recent Labs  Lab 11/08/19 0327 11/09/19 0519 11/10/19 0340 11/10/19 1451  11/11/19 0543 11/12/19 0452  WBC 7.7 6.2 6.0  --  6.7 5.9  NEUTROABS 5.6  --   --   --   --   --   HGB 3.9* 7.4* 6.7* 7.5* 8.4* 7.9*  HCT 13.6* 23.2* 20.9* 22.5* 25.7* 24.7*  MCV 109.7* 96.7 94.1  --  94.5 96.5  PLT 178 142* 128*  --  131* 118*   Cardiac Enzymes: No results for input(s): CKTOTAL, CKMB, CKMBINDEX, TROPONINI in the last 168 hours. BNP: Invalid input(s): POCBNP CBG: Recent Labs  Lab 11/10/19 2042 11/11/19 0758 11/11/19 1145 11/11/19 1630 11/12/19 0748  GLUCAP 135* 119* 124* 113* 107*   D-Dimer No results for input(s): DDIMER in the last 72 hours. Hgb A1c No results for input(s): HGBA1C in the last 72 hours. Lipid Profile No results for input(s): CHOL, HDL, LDLCALC, TRIG, CHOLHDL, LDLDIRECT in the last 72 hours. Thyroid function studies No results for input(s): TSH, T4TOTAL, T3FREE, THYROIDAB in the last 72 hours.  Invalid input(s): FREET3 Anemia work up No results for input(s): VITAMINB12, FOLATE, FERRITIN, TIBC, IRON, RETICCTPCT in the last 72 hours. Microbiology Recent Results (from the past 240 hour(s))  SARS CORONAVIRUS 2 (TAT 6-24 HRS) Nasopharyngeal Nasopharyngeal Swab     Status: None   Collection Time: 11/08/19  5:32 AM   Specimen: Nasopharyngeal Swab  Result Value Ref Range Status   SARS Coronavirus 2 NEGATIVE NEGATIVE Final    Comment: (NOTE) SARS-CoV-2 target nucleic acids are NOT DETECTED. The SARS-CoV-2 RNA is generally detectable in upper and lower respiratory specimens during the acute phase of infection. Negative results do not preclude SARS-CoV-2 infection, do not rule out co-infections with other pathogens, and should not be used as the sole basis for treatment or other patient management decisions.  Negative results must be combined with clinical observations, patient history, and epidemiological information. The expected result is Negative. Fact Sheet for Patients: HairSlick.no Fact Sheet for  Healthcare Providers: quierodirigir.com This test is not yet approved or cleared by the [place] FDA and  has been authorized for detection and/or diagnosis of SARS-CoV-2 by FDA under an Emergency Use Authorization (EUA). This EUA will remain  in effect (meaning this test can be used) for the duration of the COVID-19 declaration under Section 56 4(b)(1) of the Act, 21 U.S.C. section 360bbb-3(b)(1), unless the authorization is terminated or revoked sooner. Performed at [facility], [address] [place], [place] [address]      Discharge Instructions:   Discharge Instructions    Diet - low sodium heart healthy   Complete by: As directed    Diet Carb Modified   Complete by: As directed    Increase activity slowly   Complete by: As directed      Allergies as of 11/12/2019      Reactions   Captopril Other (See Comments)   Reaction: unknown   Morphine And Related Other (See Comments)   Reaction: unknown   Penicillins Other (See Comments)   Reaction: unknown Did it involve swelling of the face/tongue/throat, SOB, or low BP? Unknown Did it involve sudden or severe rash/hives, skin peeling, or any reaction on the inside of your mouth or nose? Unknown Did you need to seek medical attention at a hospital or doctor's office? Unknown When did it last happen?unknown If all above answers are "NO", may proceed with cephalosporin use.      Medication List    TAKE these medications   ALPRAZolam 0.5 MG tablet Commonly known as: XANAX Take 0.25 mg by mouth 2 (two) times daily as needed for anxiety.   aspirin 81 MG tablet Take 81 mg by mouth daily.   brimonidine 0.2 % ophthalmic solution Commonly known as: ALPHAGAN Place 1 drop into both eyes 2 (two) times daily.   escitalopram 10 MG tablet Commonly known as: LEXAPRO Take 10 mg by mouth daily.   ferrous gluconate 324 MG tablet Commonly known as: FERGON Take 1 tablet (324 mg total) by  mouth daily with breakfast. What changed: when to take this   furosemide 20 MG tablet Commonly known as: LASIX Take 1 tablet (20 mg total) by mouth every other day.   gabapentin 100 MG capsule Commonly known as: NEURONTIN Take 100 mg by mouth daily.   insulin lispro protamine-lispro (75-25) 100 UNIT/ML Susp injection Commonly known as: HUMALOG 75/25 MIX Inject 55-60 Units into the skin 2 (two) times daily with a meal. Take 55 units before breakfast and 60 units before supper   INSULIN SYRINGE .3CC/31GX5/16" 31G X 5/16" 0.3 ML Misc by Does not apply route.   latanoprost 0.005 % ophthalmic solution Commonly known as: XALATAN Place 1 drop into both eyes at bedtime.   magnesium oxide 400 MG tablet Commonly known as: MAG-OX Take 400 mg by mouth daily.   meclizine 25 MG tablet Commonly known as: ANTIVERT Take 25 mg by mouth 3 (three) times daily as needed for dizziness.   metFORMIN 500 MG tablet Commonly known as: GLUCOPHAGE Take 1,000 mg by mouth 2 (two) times daily with a meal.   metoprolol succinate 25 MG 24 hr tablet Commonly known as: TOPROL-XL Take 25 mg by mouth 2 (two) times daily.   NovoLIN R 100 units/mL injection Generic drug: insulin regular Inject into the skin 3 (three)  times daily before meals. Dose per sliding Scale dose max of 10 units daily   pantoprazole 40 MG tablet Commonly known as: PROTONIX Take 40 mg by mouth 2 (two) times daily.   simvastatin 40 MG tablet Commonly known as: ZOCOR Take 40 mg by mouth daily.   traMADol 50 MG tablet Commonly known as: ULTRAM Take 25 mg by mouth 3 (three) times daily as needed for moderate pain.   vitamin B-12 500 MCG tablet Commonly known as: CYANOCOBALAMIN Take 500 mcg by mouth daily.   warfarin 5 MG tablet Commonly known as: COUMADIN Take 7.5-10 mg by mouth as directed. Take 10 mg on Tuesday and Thursday. Take 7.5 mg all other days.      Follow-up Information    [facility] Follow up on 11/19/2019.   Specialty: Cardiology Why: at 2:00pm. Enter through the [facility] entrance Contact information: [address] [place] [address] [phone]           Time coordinating discharge: 28 minutes  Signed:  Lurene Shadow  [facility] 11/12/2019, 10:31 AM

## 2019-11-14 ENCOUNTER — Telehealth: Payer: Self-pay | Admitting: Family

## 2019-11-14 NOTE — Telephone Encounter (Signed)
Agreed -

## 2019-11-18 DIAGNOSIS — K31819 Angiodysplasia of stomach and duodenum without bleeding: Secondary | ICD-10-CM | POA: Insufficient documentation

## 2019-11-19 ENCOUNTER — Ambulatory Visit: Payer: Medicare Other | Admitting: Family

## 2019-11-20 ENCOUNTER — Other Ambulatory Visit: Payer: Self-pay

## 2019-11-20 DIAGNOSIS — D500 Iron deficiency anemia secondary to blood loss (chronic): Secondary | ICD-10-CM

## 2019-11-20 DIAGNOSIS — K921 Melena: Secondary | ICD-10-CM

## 2019-12-03 ENCOUNTER — Telehealth: Payer: Self-pay

## 2019-12-03 NOTE — Telephone Encounter (Signed)
Patient daughter is calling because she states the patient is wanting to cancel his Video capsule study test that is scheduled on 12/12/2019 because he wants to talk to his PCP first. Daughter states they will call back and rescheduled this test when they are ready.  Called ENDO and talk to [place] to cancel the procedure.   FYI

## 2019-12-12 ENCOUNTER — Ambulatory Visit: Admit: 2019-12-12 | Payer: Medicare Other | Admitting: Gastroenterology

## 2019-12-12 SURGERY — IMAGING PROCEDURE, GI TRACT, INTRALUMINAL, VIA CAPSULE
Anesthesia: General

## 2020-03-15 IMAGING — CT CT ANGIO CHEST
4 of 11 series · 17 of 46 positions shown · IV contrast (APPLIED)
Comparison: Chest radiograph earlier this day. Abdominal CT
09/01/2014

CLINICAL DATA: Nausea, vomiting, diarrhea, abdominal pain and
fever.

EXAM:
CT ANGIOGRAPHY CHEST
CT ABDOMEN AND PELVIS WITH CONTRAST
TECHNIQUE: Multidetector CT imaging of the chest was performed using the
standard protocol during bolus administration of intravenous
contrast. Multiplanar CT image reconstructions and MIPs were
obtained to evaluate the vascular anatomy. Multidetector CT imaging
of the abdomen and pelvis was performed using the standard protocol
during bolus administration of intravenous contrast.
CONTRAST:  100mL OMNIPAQUE IOHEXOL 350 MG/ML SOLN

[Series 7: coronal mpr · coronal · 0.59mm/px · 1 of 151 slices shown]
[im 76/151  soft-tissue]
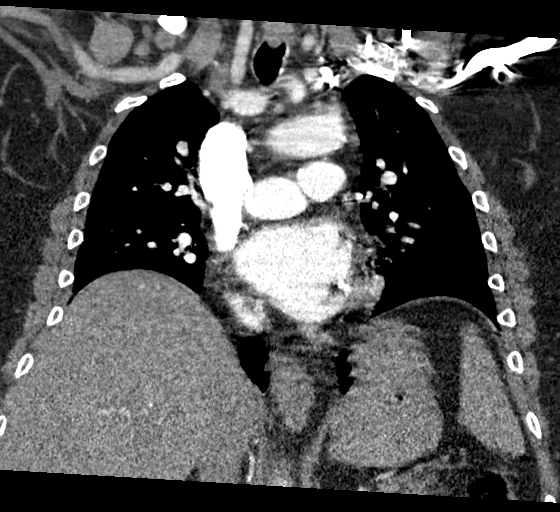

[Series 11: axial st · axial · 0.82mm/px · z∈[-972,-617]mm · 5 of 107 slices shown (1 of 2)]
[im 18/107  lung]
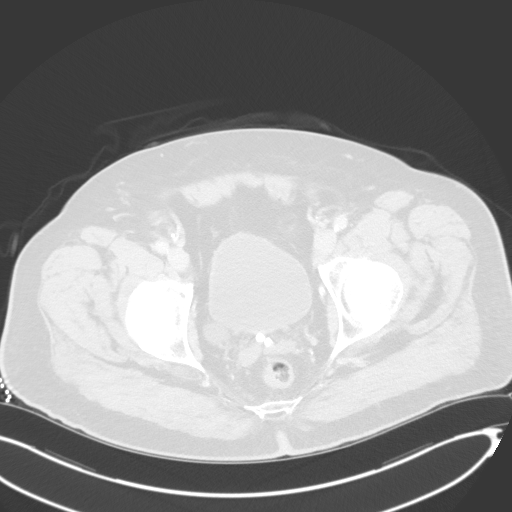
[im 36/107  soft-tissue]
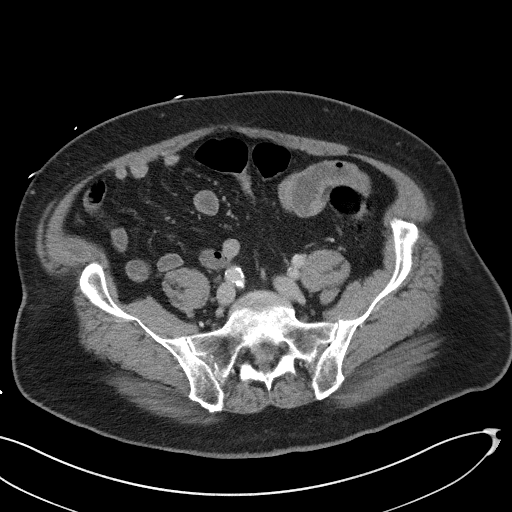
[im 54/107  lung]
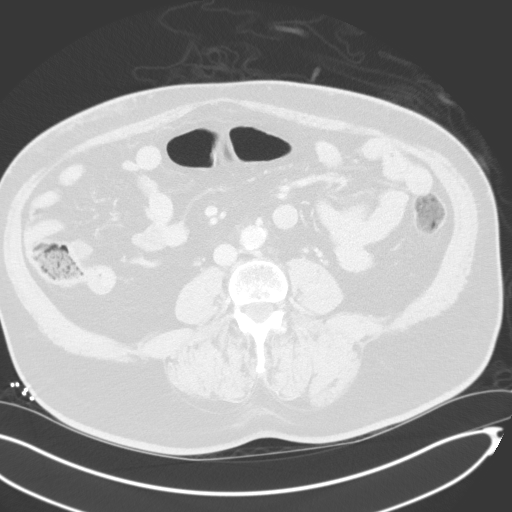
[im 71/107  soft-tissue]
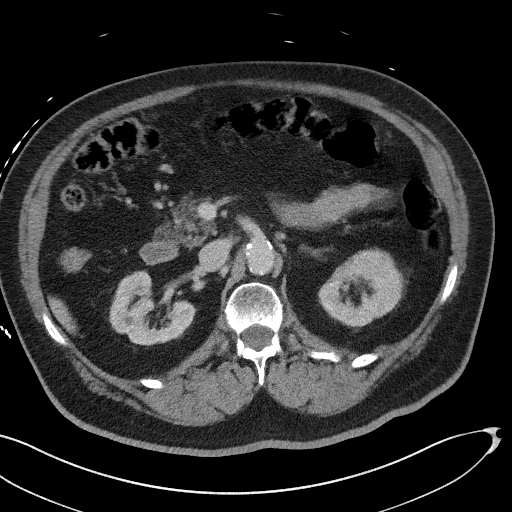
[im 89/107  lung]
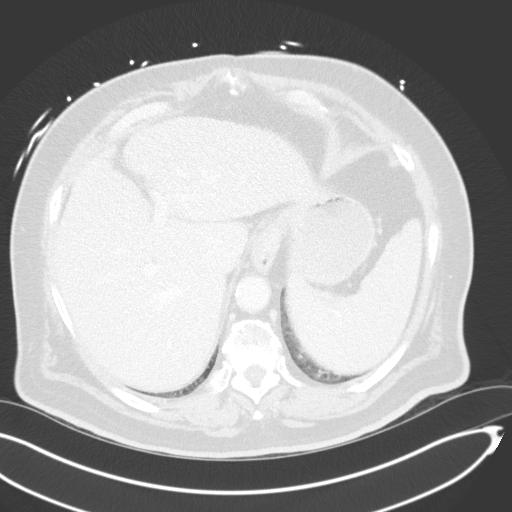

[Series 17: axial st · axial · 0.77mm/px · z∈[-588,-453]mm · 3 of 91 slices shown (2 of 2)]
[im 23/91  lung]
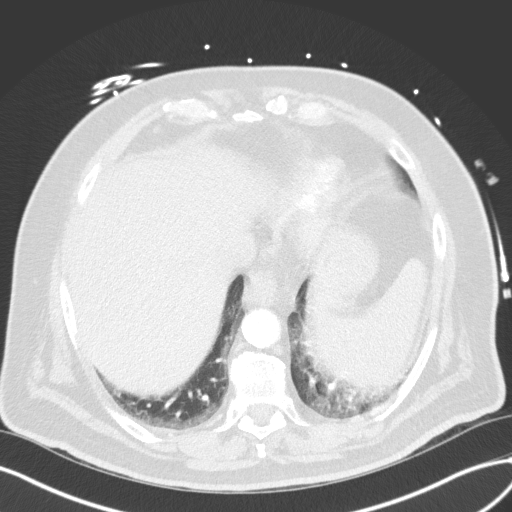
[im 46/91  lung]
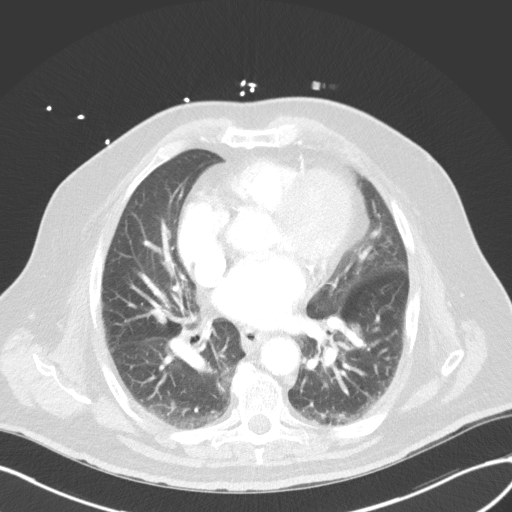
[im 68/91  lung]
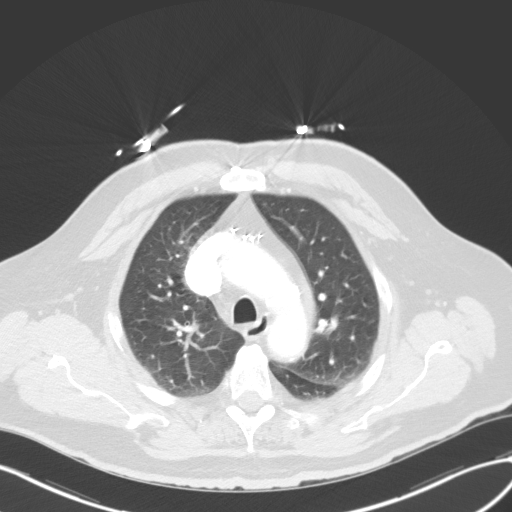

[Series 18: thins · axial · 0.70mm/px · z∈[-636,-402]mm · 8 of 271 slices shown]
[im 19/271  lung]
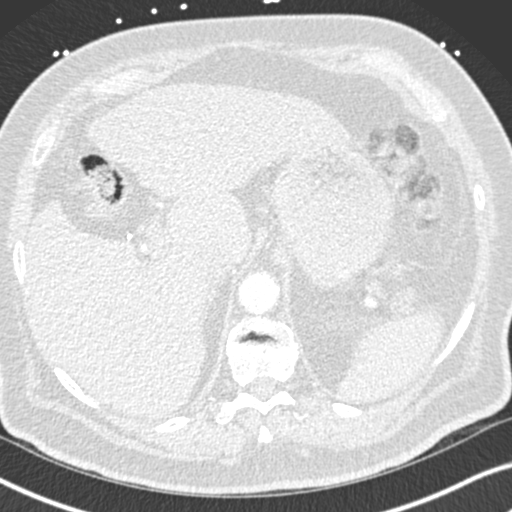
[im 55/271  lung]
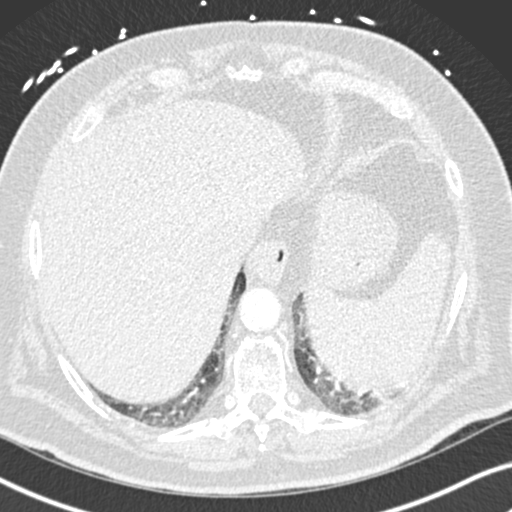
[im 91/271  lung]
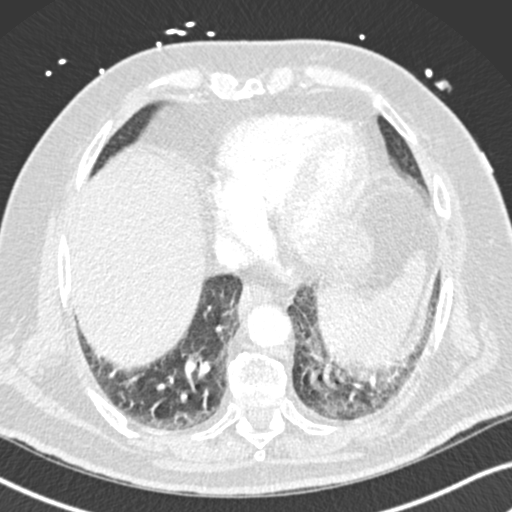
[im 127/271  lung]
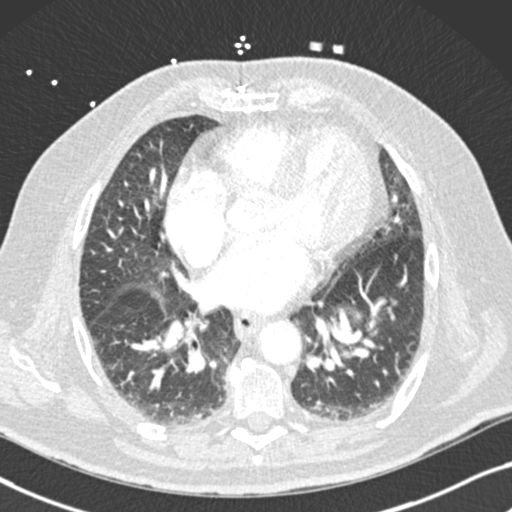
[im 145/271  lung]
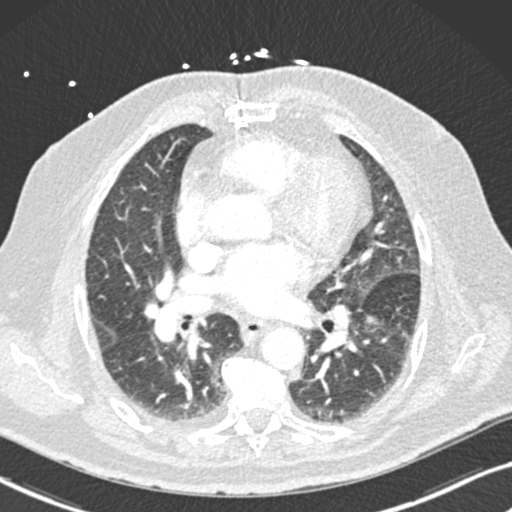
[im 181/271  lung]
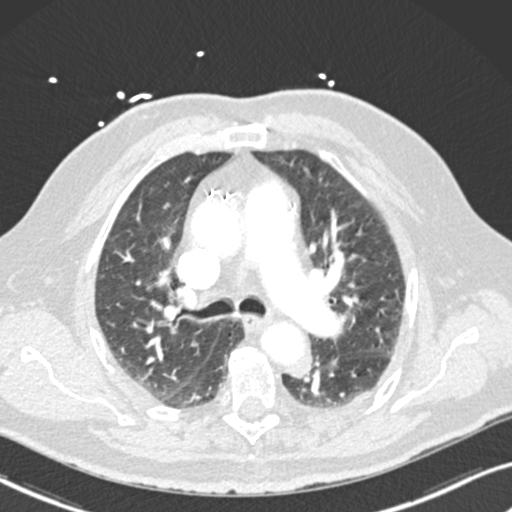
[im 217/271  lung]
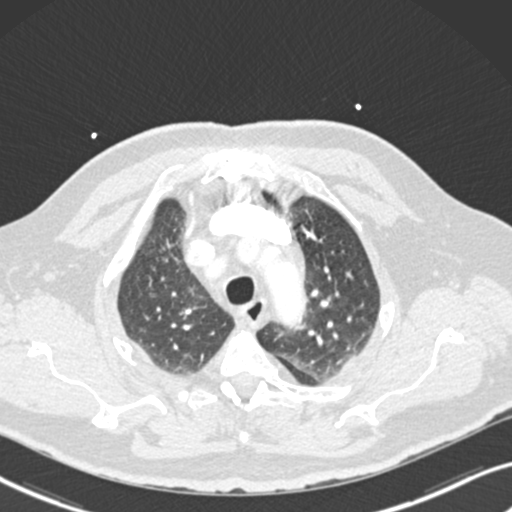
[im 253/271  lung]
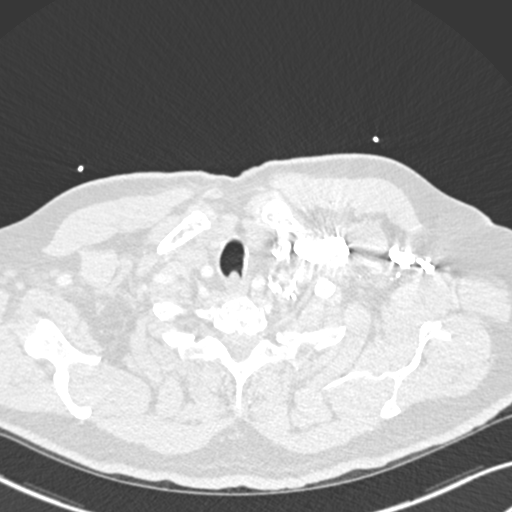

[17 of 46 positions shown; findings below may reference images not displayed]

FINDINGS: CTA CHEST FINDINGS

Cardiovascular: There are no filling defects within the pulmonary
arteries to suggest pulmonary embolus. Tortuous atherosclerotic
thoracic aorta without dissection. Post CABG with calcification of
native coronary arteries. Borderline cardiomegaly. No pericardial
effusion.

Mediastinum/Nodes: No enlarged mediastinal or hilar lymph nodes.
Esophagus mildly patulous. Small hiatal hernia. No visualized
thyroid nodule.

Lungs/Pleura: Breathing motion artifact limits basilar assessment.
Suggestion of mild septal thickening. Minor dependent atelectasis.
No confluent airspace disease. No pleural fluid.

Musculoskeletal: There are no acute or suspicious osseous
abnormalities.

Review of the MIP images confirms the above findings.

CT ABDOMEN and PELVIS FINDINGS

Hepatobiliary: Mild motion artifact limitations. Question of nodular
hepatic contour versus motion artifact. No focal hepatic
abnormality. Clips in the gallbladder fossa postcholecystectomy. No
biliary dilatation.

Pancreas: No ductal dilatation or inflammation.

Spleen: Upper normal in size spanning 14 cm.

Adrenals/Urinary Tract: Normal adrenal glands. No hydronephrosis.
Suggestion of heterogeneous enhancement of the left kidney with loss
of corticomedullary differentiation and mild perinephric edema,
motion artifact partially obscures evaluation. There are bilateral
simple cysts. Urinary bladder is physiologically distended with a
small right posterior bladder diverticulum. No perivesicular edema.

Stomach/Bowel: Small hiatal hernia. Stomach physiologically
distended. No small bowel wall thickening, inflammatory change, or
obstruction. Normal appendix. Small to moderate colonic stool
burden. Mild sigmoid diverticulosis. Distal colonic tortuosity. No
colonic wall thickening or inflammatory change.

Vascular/Lymphatic: Moderate to advanced aorta bi-iliac
atherosclerosis. No acute vascular finding. Portal vein and
mesenteric branches are patent. No enlarged lymph nodes in the
abdomen or pelvis, allowing for mild motion artifact limitations.

Reproductive: Prostate is unremarkable. Vas deferens calcifications.

Other: No free air, free fluid, or intra-abdominal fluid collection.

Musculoskeletal: There are no acute or suspicious osseous
abnormalities.

Review of the MIP images confirms the above findings.
IMPRESSION: CHEST CTA:

1. No pulmonary embolus.
2. Minimal septal thickening which may represent mild pulmonary
edema.

ABDOMEN/PELVIS:

1. Heterogeneous enhancement of the left kidney mild perinephric
edema, suspicious for acute pyelonephritis. Recommend correlation
with urinalysis.
2. Questionable nodular hepatic contours which raise possibility of
cirrhosis. Recommend correlation with cirrhosis risk factors.
3. Colonic diverticulosis without diverticulitis.

Aortic Atherosclerosis ([id number]-LOB.B).

## 2020-08-17 DIAGNOSIS — G4710 Hypersomnia, unspecified: Secondary | ICD-10-CM | POA: Insufficient documentation

## 2020-08-17 DIAGNOSIS — E1165 Type 2 diabetes mellitus with hyperglycemia: Secondary | ICD-10-CM | POA: Insufficient documentation

## 2020-12-16 ENCOUNTER — Encounter: Payer: Self-pay | Admitting: Podiatry

## 2020-12-16 ENCOUNTER — Ambulatory Visit: Payer: Medicare Other | Admitting: Podiatry

## 2020-12-16 ENCOUNTER — Other Ambulatory Visit: Payer: Self-pay

## 2020-12-16 DIAGNOSIS — L97511 Non-pressure chronic ulcer of other part of right foot limited to breakdown of skin: Secondary | ICD-10-CM

## 2020-12-16 MED ORDER — CLINDAMYCIN HCL 150 MG PO CAPS: 150.0000 mg | ORAL_CAPSULE | Freq: Three times a day (TID) | ORAL | 1 refills | Status: DC

## 2020-12-16 MED ORDER — MUPIROCIN 2 % EX OINT: 1.0000 "application " | TOPICAL_OINTMENT | Freq: Two times a day (BID) | CUTANEOUS | 0 refills | Status: DC

## 2020-12-16 NOTE — Progress Notes (Signed)
Subjective:  Patient ID: Jeffrey Mills, male    DOB: 04-01-1936,  MRN: [id number] HPI Chief Complaint  Patient presents with  . Toe Pain    Hallux right - wound tip of toe x 6 weeks, PCP said it was a bunion, been using neosporin  . New Patient (Initial Visit)    Est pt 69    85 y.o. male presents with the above complaint.   ROS: Denies fever chills nausea vomiting muscle aches pains calf pain back pain chest pain shortness of breath.  Past Medical History:  Diagnosis Date  . Anemia   . Anxiety   . Arthritis   . Benign hypertension   . Cardiopulmonary arrest (HCC)   . CHF (congestive heart failure) (HCC)   . Coronary atherosclerosis of native coronary artery   . Depression   . Diabetes (HCC)   . Glaucoma   . Hyperlipidemia   . Mitral valve disorder   . Neuralgia   . PVD (peripheral vascular disease) (HCC)    Past Surgical History:  Procedure Laterality Date  . CHOLECYSTECTOMY    . COLONOSCOPY WITH PROPOFOL N/A 11/11/2019   Procedure: COLONOSCOPY WITH PROPOFOL;  Surgeon: Toney Reil, MD;  Location: [facility] ENDOSCOPY;  Service: Gastroenterology;  Laterality: N/A;  . CORONARY ANGIOPLASTY WITH STENT PLACEMENT    . CORONARY ARTERY BYPASS GRAFT    . ESOPHAGOGASTRODUODENOSCOPY (EGD) WITH PROPOFOL N/A 11/11/2019   Procedure: ESOPHAGOGASTRODUODENOSCOPY (EGD) WITH PROPOFOL;  Surgeon: Toney Reil, MD;  Location: [facility] ENDOSCOPY;  Service: Gastroenterology;  Laterality: N/A;  . MITRAL VALVE ANNULOPLASTY    . TEE WITHOUT CARDIOVERSION N/A 12/05/2018   Procedure: TRANSESOPHAGEAL ECHOCARDIOGRAM (TEE);  Surgeon: Dalia Heading, MD;  Location: [facility] ORS;  Service: Cardiovascular;  Laterality: N/A;    Current Outpatient Medications:  .  clindamycin (CLEOCIN) 150 MG capsule, Take 1 capsule (150 mg total) by mouth 3 (three) times daily., Disp: 30 capsule, Rfl: 1 .  diphenhydramine-acetaminophen (TYLENOL PM) 25-500 MG TABS tablet, Take 1 tablet by mouth at bedtime as  needed., Disp: , Rfl:  .  mupirocin ointment (BACTROBAN) 2 %, Apply 1 application topically 2 (two) times daily., Disp: 22 g, Rfl: 0 .  aspirin 81 MG tablet, Take 81 mg by mouth daily., Disp: , Rfl:  .  brimonidine (ALPHAGAN) 0.2 % ophthalmic solution, Place 1 drop into both eyes 2 (two) times daily., Disp: , Rfl:  .  escitalopram (LEXAPRO) 10 MG tablet, Take 10 mg by mouth daily. , Disp: , Rfl:  .  ferrous gluconate (FERGON) 324 MG tablet, Take 1 tablet (324 mg total) by mouth daily with breakfast., Disp: , Rfl: 3 .  furosemide (LASIX) 20 MG tablet, Take 1 tablet (20 mg total) by mouth every other day., Disp: 30 tablet, Rfl: 0 .  insulin lispro protamine-lispro (HUMALOG 75/25 MIX) (75-25) 100 UNIT/ML SUSP injection, Inject 55-60 Units into the skin 2 (two) times daily with a meal. Take 55 units before breakfast and 60 units before supper, Disp: , Rfl:  .  insulin regular (NOVOLIN R) 100 units/mL injection, Inject into the skin 3 (three) times daily before meals. Dose per sliding Scale dose Jihan Mellette of 10 units daily, Disp: , Rfl:  .  Insulin Syringe-Needle U-100 (INSULIN SYRINGE .3CC/31GX5/16") 31G X 5/16" 0.3 ML MISC, by Does not apply route., Disp: , Rfl:  .  latanoprost (XALATAN) 0.005 % ophthalmic solution, Place 1 drop into both eyes at bedtime. , Disp: , Rfl:  .  meclizine (ANTIVERT) 25 MG  tablet, Take 25 mg by mouth 3 (three) times daily as needed for dizziness., Disp: , Rfl:  .  metFORMIN (GLUCOPHAGE) 500 MG tablet, Take 1,000 mg by mouth 2 (two) times daily with a meal. , Disp: , Rfl:  .  metoprolol succinate (TOPROL-XL) 25 MG 24 hr tablet, Take 25 mg by mouth 2 (two) times daily. , Disp: , Rfl:  .  pantoprazole (PROTONIX) 40 MG tablet, Take 40 mg by mouth 2 (two) times daily. , Disp: , Rfl:  .  simvastatin (ZOCOR) 40 MG tablet, Take 40 mg by mouth daily., Disp: , Rfl:  .  vitamin B-12 (CYANOCOBALAMIN) 500 MCG tablet, Take 500 mcg by mouth daily., Disp: , Rfl:  .  warfarin (COUMADIN) 5 MG  tablet, Take 7.5-10 mg by mouth as directed. Take 10 mg on Tuesday and Thursday. Take 7.5 mg all other days., Disp: , Rfl:   Allergies  Allergen Reactions  . Captopril Other (See Comments)    Reaction: unknown  . Morphine And Related Other (See Comments)    Reaction: unknown  . Penicillins Other (See Comments)    Reaction: unknown Did it involve swelling of the face/tongue/throat, SOB, or low BP? Unknown Did it involve sudden or severe rash/hives, skin peeling, or any reaction on the inside of your mouth or nose? Unknown Did you need to seek medical attention at a hospital or doctor's office? Unknown When did it last happen?unknown If all above answers are "NO", may proceed with cephalosporin use.    Review of Systems Objective:  There were no vitals filed for this visit.  General: Well developed, nourished, in no acute distress, alert and oriented x3   Dermatological: Skin is warm, dry and supple bilateral. Nails x 10 are well maintained; remaining integument appears unremarkable at this time. There are no open sores, no preulcerative lesions, no rash or signs of infection present.  Vascular: Dorsalis Pedis artery and Posterior Tibial artery pedal pulses are 2/4 bilateral with immedate capillary fill time. Pedal hair growth present. No varicosities and no lower extremity edema present bilateral.  Diabetic ulceration to the distal medial aspect of the hallux right measuring 0.8 cm in total diameter after debridement 1.0 in diameter.  Gross granulation tissue is present no purulence no malodor does not probe to bone.  Neruologic: Grossly intact via light touch bilateral. Vibratory intact via tuning fork bilateral. Protective threshold with Semmes Wienstein monofilament intact to all pedal sites bilateral. Patellar and Achilles deep tendon reflexes 2+ bilateral. No Babinski or clonus noted bilateral.   Musculoskeletal: No gross boney pedal deformities bilateral. No pain, crepitus, or  limitation noted with foot and ankle range of motion bilateral. Muscular strength 5/5 in all groups tested bilateral.  Gait: Unassisted, Nonantalgic.    Radiographs:  None taken  Assessment & Plan:   Assessment: Diabetic ulceration distal medial aspect hallux right.  Plan: At this point we will start him on clindamycin.  I debrided all reactive tissue today and nongranular tissue placing silver nitrate and then Silvadene cream with a dry sterile compressive dressing.  Put him in a Darco shoe and I will follow-up with him in 2 weeks.  He will go ahead and start daily dressing changes with the Bactroban ointment that we have sent over.  We will have x-rays taken the next time he comes in to make sure that we have no bone infection.     Shaft Corigliano T. [place], [place]

## 2020-12-30 ENCOUNTER — Ambulatory Visit (INDEPENDENT_AMBULATORY_CARE_PROVIDER_SITE_OTHER): Payer: Medicare Other

## 2020-12-30 ENCOUNTER — Ambulatory Visit: Payer: Medicare Other | Admitting: Podiatry

## 2020-12-30 ENCOUNTER — Encounter: Payer: Self-pay | Admitting: Podiatry

## 2020-12-30 ENCOUNTER — Other Ambulatory Visit: Payer: Self-pay

## 2020-12-30 DIAGNOSIS — L97511 Non-pressure chronic ulcer of other part of right foot limited to breakdown of skin: Secondary | ICD-10-CM | POA: Diagnosis not present

## 2020-12-30 DIAGNOSIS — E1140 Type 2 diabetes mellitus with diabetic neuropathy, unspecified: Secondary | ICD-10-CM

## 2020-12-30 DIAGNOSIS — I482 Chronic atrial fibrillation, unspecified: Secondary | ICD-10-CM

## 2020-12-30 DIAGNOSIS — E1165 Type 2 diabetes mellitus with hyperglycemia: Secondary | ICD-10-CM | POA: Diagnosis not present

## 2020-12-30 DIAGNOSIS — I739 Peripheral vascular disease, unspecified: Secondary | ICD-10-CM

## 2021-01-01 NOTE — Progress Notes (Signed)
  Subjective:  Patient ID: Jeffrey Mills, male    DOB: 06-20-1936,  MRN: [id number]  Chief Complaint  Patient presents with  . Wound Check    Per daughter, "I think its looking better"    85 y.o. male presents with the above complaint. History confirmed with patient.  No pain  Objective:  Physical Exam: warm, good capillary refill and normal DP and PT pulses.  Abnormal sensory exam with loss of protective sensation and light touch .  Right hallux ulceration measuring 5 mm x 5 mm x 0.1 cm.  No signs of infection.  No purulence or malodor.  No drainage.  Surrounding hyperkeratosis.    Radiographs: X-ray of the right foot: Some periosteal reaction on the distal plantar medial phalanx but no definite evidence of osteomyelitis Assessment:   1. Skin ulcer of right great toe, limited to breakdown of skin (HCC)   2. Uncontrolled type 2 diabetes mellitus with hyperglycemia (HCC)   3. Atrial fibrillation, chronic (HCC)   4. Chronic painful diabetic neuropathy (HCC)   5. PVD (peripheral vascular disease) (HCC)      Plan:  Patient was evaluated and treated and all questions answered.  Ulcer right hallux -Debridement as below. -Dressed with Iodosorb, DSD. -Continue off-loading with surgical shoe. -X-ray reviewed, no definite evidence of osteomyelitis there is some periosteal reaction.  Given clinical appearance of the wound I have low suspicion for osteomyelitis, if it worsens will consider MRI  Procedure: Excisional Debridement of Wound Rationale: Removal of non-viable soft tissue from the wound to promote healing.  Anesthesia: none Pre-Debridement Wound Measurements: 0.5 cm x 0.5 cm x 0.1 cm  Post-Debridement Wound Measurements: Same as predebridement Type of Debridement: Sharp selective Tissue Removed: Non-viable soft tissue and hyperkeratosis Depth of Debridement: subcutaneous tissue. Technique: Sharp selective debridement to bleeding, viable wound base.  Dressing: Dry,  sterile, compression dressing. Disposition: Patient tolerated procedure well.   Return in about 3 weeks (around 01/20/2021) for wound re-check.

## 2021-01-18 ENCOUNTER — Ambulatory Visit: Payer: Medicare Other | Admitting: Podiatry

## 2021-01-27 ENCOUNTER — Ambulatory Visit: Payer: Medicare Other | Admitting: Podiatry

## 2021-01-27 ENCOUNTER — Encounter: Payer: Self-pay | Admitting: Podiatry

## 2021-01-27 ENCOUNTER — Other Ambulatory Visit: Payer: Self-pay

## 2021-01-27 DIAGNOSIS — E1165 Type 2 diabetes mellitus with hyperglycemia: Secondary | ICD-10-CM | POA: Diagnosis not present

## 2021-01-27 DIAGNOSIS — L97511 Non-pressure chronic ulcer of other part of right foot limited to breakdown of skin: Secondary | ICD-10-CM | POA: Diagnosis not present

## 2021-01-27 DIAGNOSIS — I739 Peripheral vascular disease, unspecified: Secondary | ICD-10-CM | POA: Diagnosis not present

## 2021-01-27 NOTE — Patient Instructions (Signed)
Monitor the toe for an new ulceration or wounds or cuts/scrapes/blisters. Come see [place] ASAP if they occur  No need to apply the salve any more. Apply a good lotion such as the udder cream you have daily   Wear your regular shoes again

## 2021-01-31 NOTE — Progress Notes (Signed)
     Subjective:  Patient ID: Jeffrey Mills, male    DOB: [birth year],  MRN: [id number]  Chief Complaint  Patient presents with  . Foot Ulcer    "its looking good.  I think its almost healed up now"    85 y.o. male returns with the above complaint. History confirmed with patient.  No pain  Objective:  Physical Exam: warm, good capillary refill and normal DP and PT pulses.  Abnormal sensory exam with loss of protective sensation and light touch .  Ulceration is healed    Radiographs: X-ray of the right foot: Some periosteal reaction on the distal plantar medial phalanx but no definite evidence of osteomyelitis   Assessment:     1. Skin ulcer of right great toe, limited to breakdown of skin (HCC)   2. Uncontrolled type 2 diabetes mellitus with hyperglycemia (HCC)   3. PVD (peripheral vascular disease) (HCC)      Plan:  Patient was evaluated and treated and all questions answered.  Ulcer right hallux -Healed completely.  Advised him to continue close surveillance on his feet for any new ulcerations or injuries.  Return if symptoms worsen or fail to improve.

## 2021-10-06 ENCOUNTER — Other Ambulatory Visit: Payer: Self-pay

## 2021-10-06 ENCOUNTER — Inpatient Hospital Stay
Admission: EM | Admit: 2021-10-06 | Discharge: 2021-10-11 | DRG: 378 | Disposition: A | Discharge status: Home / Self Care | Payer: Medicare Other | Attending: Student in an Organized Health Care Education/Training Program | Admitting: Student in an Organized Health Care Education/Training Program

## 2021-10-06 ENCOUNTER — Encounter: Payer: Self-pay | Admitting: Emergency Medicine

## 2021-10-06 DIAGNOSIS — K921 Melena: Secondary | ICD-10-CM | POA: Diagnosis present

## 2021-10-06 DIAGNOSIS — E1151 Type 2 diabetes mellitus with diabetic peripheral angiopathy without gangrene: Secondary | ICD-10-CM | POA: Diagnosis present

## 2021-10-06 DIAGNOSIS — Z952 Presence of prosthetic heart valve: Secondary | ICD-10-CM

## 2021-10-06 DIAGNOSIS — F32A Depression, unspecified: Secondary | ICD-10-CM | POA: Diagnosis present

## 2021-10-06 DIAGNOSIS — D649 Anemia, unspecified: Secondary | ICD-10-CM | POA: Diagnosis not present

## 2021-10-06 DIAGNOSIS — Z7901 Long term (current) use of anticoagulants: Secondary | ICD-10-CM | POA: Diagnosis not present

## 2021-10-06 DIAGNOSIS — Z7984 Long term (current) use of oral hypoglycemic drugs: Secondary | ICD-10-CM

## 2021-10-06 DIAGNOSIS — E861 Hypovolemia: Secondary | ICD-10-CM | POA: Diagnosis present

## 2021-10-06 DIAGNOSIS — I130 Hypertensive heart and chronic kidney disease with heart failure and stage 1 through stage 4 chronic kidney disease, or unspecified chronic kidney disease: Secondary | ICD-10-CM | POA: Diagnosis present

## 2021-10-06 DIAGNOSIS — E785 Hyperlipidemia, unspecified: Secondary | ICD-10-CM | POA: Diagnosis present

## 2021-10-06 DIAGNOSIS — Z888 Allergy status to other drugs, medicaments and biological substances status: Secondary | ICD-10-CM | POA: Diagnosis not present

## 2021-10-06 DIAGNOSIS — I482 Chronic atrial fibrillation, unspecified: Secondary | ICD-10-CM | POA: Diagnosis present

## 2021-10-06 DIAGNOSIS — K922 Gastrointestinal hemorrhage, unspecified: Secondary | ICD-10-CM | POA: Diagnosis present

## 2021-10-06 DIAGNOSIS — D7589 Other specified diseases of blood and blood-forming organs: Secondary | ICD-10-CM | POA: Diagnosis present

## 2021-10-06 DIAGNOSIS — E872 Acidosis, unspecified: Secondary | ICD-10-CM | POA: Diagnosis present

## 2021-10-06 DIAGNOSIS — N183 Chronic kidney disease, stage 3 unspecified: Secondary | ICD-10-CM

## 2021-10-06 DIAGNOSIS — Z8249 Family history of ischemic heart disease and other diseases of the circulatory system: Secondary | ICD-10-CM

## 2021-10-06 DIAGNOSIS — Z833 Family history of diabetes mellitus: Secondary | ICD-10-CM

## 2021-10-06 DIAGNOSIS — Z794 Long term (current) use of insulin: Secondary | ICD-10-CM

## 2021-10-06 DIAGNOSIS — I252 Old myocardial infarction: Secondary | ICD-10-CM | POA: Diagnosis not present

## 2021-10-06 DIAGNOSIS — Z20822 Contact with and (suspected) exposure to covid-19: Secondary | ICD-10-CM | POA: Diagnosis present

## 2021-10-06 DIAGNOSIS — E119 Type 2 diabetes mellitus without complications: Secondary | ICD-10-CM

## 2021-10-06 DIAGNOSIS — Z885 Allergy status to narcotic agent status: Secondary | ICD-10-CM

## 2021-10-06 DIAGNOSIS — K2970 Gastritis, unspecified, without bleeding: Secondary | ICD-10-CM | POA: Diagnosis present

## 2021-10-06 DIAGNOSIS — R269 Unspecified abnormalities of gait and mobility: Secondary | ICD-10-CM | POA: Diagnosis present

## 2021-10-06 DIAGNOSIS — T17908A Unspecified foreign body in respiratory tract, part unspecified causing other injury, initial encounter: Secondary | ICD-10-CM

## 2021-10-06 DIAGNOSIS — I509 Heart failure, unspecified: Secondary | ICD-10-CM | POA: Diagnosis present

## 2021-10-06 DIAGNOSIS — K274 Chronic or unspecified peptic ulcer, site unspecified, with hemorrhage: Secondary | ICD-10-CM | POA: Diagnosis not present

## 2021-10-06 DIAGNOSIS — E1122 Type 2 diabetes mellitus with diabetic chronic kidney disease: Secondary | ICD-10-CM | POA: Diagnosis present

## 2021-10-06 DIAGNOSIS — N179 Acute kidney failure, unspecified: Secondary | ICD-10-CM | POA: Diagnosis present

## 2021-10-06 DIAGNOSIS — E11649 Type 2 diabetes mellitus with hypoglycemia without coma: Secondary | ICD-10-CM | POA: Diagnosis present

## 2021-10-06 DIAGNOSIS — Z880 Allergy status to penicillin: Secondary | ICD-10-CM

## 2021-10-06 DIAGNOSIS — Z951 Presence of aortocoronary bypass graft: Secondary | ICD-10-CM

## 2021-10-06 DIAGNOSIS — I10 Essential (primary) hypertension: Secondary | ICD-10-CM | POA: Diagnosis not present

## 2021-10-06 DIAGNOSIS — I2510 Atherosclerotic heart disease of native coronary artery without angina pectoris: Secondary | ICD-10-CM | POA: Diagnosis present

## 2021-10-06 DIAGNOSIS — Z955 Presence of coronary angioplasty implant and graft: Secondary | ICD-10-CM

## 2021-10-06 DIAGNOSIS — N1831 Chronic kidney disease, stage 3a: Secondary | ICD-10-CM | POA: Diagnosis present

## 2021-10-06 DIAGNOSIS — K449 Diaphragmatic hernia without obstruction or gangrene: Secondary | ICD-10-CM | POA: Diagnosis present

## 2021-10-06 DIAGNOSIS — Z79899 Other long term (current) drug therapy: Secondary | ICD-10-CM

## 2021-10-06 DIAGNOSIS — D500 Iron deficiency anemia secondary to blood loss (chronic): Secondary | ICD-10-CM | POA: Diagnosis present

## 2021-10-06 DIAGNOSIS — H409 Unspecified glaucoma: Secondary | ICD-10-CM | POA: Diagnosis present

## 2021-10-06 DIAGNOSIS — Z7982 Long term (current) use of aspirin: Secondary | ICD-10-CM

## 2021-10-06 DIAGNOSIS — F419 Anxiety disorder, unspecified: Secondary | ICD-10-CM | POA: Diagnosis present

## 2021-10-06 LAB — RESP PANEL BY RT-PCR (FLU A&B, COVID) ARPGX2
Influenza A by PCR: NEGATIVE
Influenza B by PCR: NEGATIVE
SARS Coronavirus 2 by RT PCR: NEGATIVE

## 2021-10-06 LAB — COMPREHENSIVE METABOLIC PANEL
ALT: 15 U/L (ref 0–44)
AST: 19 U/L (ref 15–41)
Albumin: 3.7 g/dL (ref 3.5–5.0)
Alkaline Phosphatase: 66 U/L (ref 38–126)
Anion gap: 8 (ref 5–15)
BUN: 23 mg/dL (ref 8–23)
CO2: 19 mmol/L — ABNORMAL LOW (ref 22–32)
Calcium: 8.4 mg/dL — ABNORMAL LOW (ref 8.9–10.3)
Chloride: 110 mmol/L (ref 98–111)
Creatinine, Ser: 1.52 mg/dL — ABNORMAL HIGH (ref 0.61–1.24)
GFR, Estimated: 45 mL/min — ABNORMAL LOW (ref >60)
Glucose, Bld: 88 mg/dL (ref 70–99)
Potassium: 4.5 mmol/L (ref 3.5–5.1)
Sodium: 137 mmol/L (ref 135–145)
Total Bilirubin: 1.1 mg/dL (ref 0.3–1.2)
Total Protein: 7.6 g/dL (ref 6.5–8.1)

## 2021-10-06 LAB — CBC
HCT: 22.6 % — ABNORMAL LOW (ref 39.0–52.0)
Hemoglobin: 6.9 g/dL — ABNORMAL LOW (ref 13.0–17.0)
MCH: 31.5 pg (ref 26.0–34.0)
MCHC: 30.5 g/dL (ref 30.0–36.0)
MCV: 103.2 fL — ABNORMAL HIGH (ref 80.0–100.0)
Platelets: 135 10*3/uL — ABNORMAL LOW (ref 150–400)
RBC: 2.19 MIL/uL — ABNORMAL LOW (ref 4.22–5.81)
RDW: 17.1 % — ABNORMAL HIGH (ref 11.5–15.5)
WBC: 4.7 10*3/uL (ref 4.0–10.5)
nRBC: 0 % (ref 0.0–0.2)

## 2021-10-06 LAB — CBG MONITORING, ED: Glucose-Capillary: 76 mg/dL (ref 70–99)

## 2021-10-06 LAB — PROTIME-INR
INR: 1.7 — ABNORMAL HIGH (ref 0.8–1.2)
Prothrombin Time: 19.5 seconds — ABNORMAL HIGH (ref 11.4–15.2)

## 2021-10-06 LAB — HEMOGLOBIN AND HEMATOCRIT, BLOOD
HCT: 21.9 % — ABNORMAL LOW (ref 39.0–52.0)
Hemoglobin: 6.9 g/dL — ABNORMAL LOW (ref 13.0–17.0)

## 2021-10-06 LAB — PREPARE RBC (CROSSMATCH)

## 2021-10-06 MED ORDER — BRIMONIDINE TARTRATE 0.2 % OP SOLN
1.0000 [drp] | Freq: Two times a day (BID) | OPHTHALMIC | Status: DC
  Administered 2021-10-06 – 2021-10-11 (×10): 1 [drp] via OPHTHALMIC
  Filled 2021-10-06: qty 5

## 2021-10-06 MED ORDER — LACTATED RINGERS IV SOLN: INTRAVENOUS | Status: DC

## 2021-10-06 MED ORDER — PANTOPRAZOLE SODIUM 40 MG IV SOLR
40.0000 mg | Freq: Once | INTRAVENOUS | Status: AC
  Administered 2021-10-06: 40 mg via INTRAVENOUS
  Filled 2021-10-06: qty 40

## 2021-10-06 MED ORDER — PANTOPRAZOLE INFUSION (NEW) - SIMPLE MED
8.0000 mg/h | INTRAVENOUS | Status: DC
  Administered 2021-10-06 – 2021-10-08 (×4): 8 mg/h via INTRAVENOUS
  Filled 2021-10-06 (×2): qty 100
  Filled 2021-10-06: qty 80
  Filled 2021-10-06: qty 100

## 2021-10-06 MED ORDER — LORAZEPAM 0.5 MG PO TABS
0.5000 mg | ORAL_TABLET | Freq: Every day | ORAL | Status: DC | PRN
  Administered 2021-10-06 – 2021-10-10 (×5): 0.5 mg via ORAL
  Filled 2021-10-06 (×5): qty 1

## 2021-10-06 MED ORDER — PANTOPRAZOLE SODIUM 40 MG IV SOLR: 40.0000 mg | Freq: Two times a day (BID) | INTRAVENOUS | Status: DC

## 2021-10-06 MED ORDER — PANTOPRAZOLE 80MG IVPB - SIMPLE MED
80.0000 mg | Freq: Once | INTRAVENOUS | Status: AC
  Administered 2021-10-06: 80 mg via INTRAVENOUS
  Filled 2021-10-06: qty 80

## 2021-10-06 MED ORDER — SODIUM CHLORIDE 0.9 % IV SOLN: 10.0000 mL/h | Freq: Once | INTRAVENOUS | Status: DC

## 2021-10-06 MED ORDER — ESCITALOPRAM OXALATE 10 MG PO TABS
10.0000 mg | ORAL_TABLET | Freq: Every day | ORAL | Status: DC
  Administered 2021-10-07 – 2021-10-11 (×5): 10 mg via ORAL
  Filled 2021-10-06 (×5): qty 1

## 2021-10-06 MED ORDER — SIMVASTATIN 20 MG PO TABS
40.0000 mg | ORAL_TABLET | Freq: Every day | ORAL | Status: DC
  Administered 2021-10-07 – 2021-10-11 (×5): 40 mg via ORAL
  Filled 2021-10-06: qty 4
  Filled 2021-10-06 (×5): qty 2

## 2021-10-06 NOTE — H&P (Signed)
History and Physical  [name] XTK:[id number] DOB: August 20, 1936 DOA: 10/06/2021  Referring physician: Dr. Scotty Court, EDP PCP: Danella Penton, MD  Outpatient Specialists: Cardiology, endocrinology. Patient coming from: Home via his PCPs office.  Chief Complaint: Symptomatic anemia with concern for upper GI bleed.  HPI: Jeffrey Mills is a 85 y.o. male with medical history significant for chronic atrial fibrillation on Coumadin, iron deficiency anemia due to chronic blood loss, hyperlipidemia, type 2 diabetes, coronary artery disease status post PCI with stenting, CKD 3, severe mitral regurgitation, prior MI, mitral valve annuloplasty, who presented to [facility] ED at the recommendation of his primary care provider due to symptomatic anemia with hemoglobin drop down to 7.2 from 11.  Associated with intermittent melena and dyspnea with minimal exertion, fatigue, and dizziness with standing.  Endorses 7 bowel movements day prior to presentation with in between dark stools.  Denies hematochezia.  Has not taken his iron supplement in more than 3 weeks.  Denies abdomina pain or chest pain.  On review of medical records patient had an endoscopy, EGD done on 11/11/2019 by Dr. Allegra Lai which showed a single nonbleeding angiectasia in the stomach.  Treated with argon plasma coagulation.  He also had a colonoscopy which showed diverticulosis in the sigmoid and descending colon.  Upon presentation to the ED, hemoglobin 6.9 with hematocrit of 22.6, 2 units PRBCs ordered to be transfused.  Started on Protonix drip received IV Protonix bolus.  GI consulted by EDP.  Admitted by [facility], hospitalist service.  ED Course: Temperature 98.  BP 162/74, pulse 69, respiration rate 18, O2 saturation 98% on room air.  Lab studies remarkable for WBC 4.7, hemoglobin 6.9, MCV 103.2, platelet count 135.  Glucose 88, serum bicarb 19, BUN 23, creatinine 1.52.  Review of Systems: Review of systems as noted in the HPI. All other systems  reviewed and are negative.   Past Medical History:  Diagnosis Date   Anemia    Anxiety    Arthritis    Benign hypertension    Cardiopulmonary arrest (HCC)    CHF (congestive heart failure) (HCC)    Coronary atherosclerosis of native coronary artery    Depression    Diabetes (HCC)    Glaucoma    Hyperlipidemia    Mitral valve disorder    Neuralgia    PVD (peripheral vascular disease) (HCC)    Past Surgical History:  Procedure Laterality Date   CHOLECYSTECTOMY     COLONOSCOPY WITH PROPOFOL N/A 11/11/2019   Procedure: COLONOSCOPY WITH PROPOFOL;  Surgeon: Toney Reil, MD;  Location: [facility] ENDOSCOPY;  Service: Gastroenterology;  Laterality: N/A;   CORONARY ANGIOPLASTY WITH STENT PLACEMENT     CORONARY ARTERY BYPASS GRAFT     ESOPHAGOGASTRODUODENOSCOPY (EGD) WITH PROPOFOL N/A 11/11/2019   Procedure: ESOPHAGOGASTRODUODENOSCOPY (EGD) WITH PROPOFOL;  Surgeon: Toney Reil, MD;  Location: [facility] ENDOSCOPY;  Service: Gastroenterology;  Laterality: N/A;   MITRAL VALVE ANNULOPLASTY     TEE WITHOUT CARDIOVERSION N/A 12/05/2018   Procedure: TRANSESOPHAGEAL ECHOCARDIOGRAM (TEE);  Surgeon: Dalia Heading, MD;  Location: [facility] ORS;  Service: Cardiovascular;  Laterality: N/A;    Social History:  reports that he has never smoked. He has never used smokeless tobacco. He reports that he does not drink alcohol and does not use drugs.   Allergies  Allergen Reactions   Captopril Other (See Comments)    Reaction: unknown   Morphine And Related Other (See Comments)    Reaction: unknown   Penicillins Other (See Comments)  Reaction: unknown Did it involve swelling of the face/tongue/throat, SOB, or low BP? Unknown Did it involve sudden or severe rash/hives, skin peeling, or any reaction on the inside of your mouth or nose? Unknown Did you need to seek medical attention at a hospital or doctor's office? Unknown When did it last happen? unknown If all above answers are "NO", may proceed  with cephalosporin use.     Family History  Problem Relation Age of Onset   Heart disease Father    ALS Father    Hypertension Mother    Breast cancer Mother    Heart attack Paternal Uncle    Diabetes Son    Heart disease Son       Prior to Admission medications   Medication Sig Start Date End Date Taking? Authorizing Provider  aspirin 81 MG tablet Take 81 mg by mouth daily.    [provider]  brimonidine (ALPHAGAN) 0.2 % ophthalmic solution Place 1 drop into both eyes 2 (two) times daily.    [provider]  buPROPion (WELLBUTRIN XL) 150 MG 24 hr tablet Take 150 mg by mouth daily as needed. 09/27/21   [provider]  clindamycin (CLEOCIN) 150 MG capsule Take 1 capsule (150 mg total) by mouth 3 (three) times daily. Patient not taking: Reported on 10/06/2021 12/16/20   Hyatt, Max T, DPM  diphenhydramine-acetaminophen (TYLENOL PM) 25-500 MG TABS tablet Take 1 tablet by mouth at bedtime as needed.    [provider]  escitalopram (LEXAPRO) 10 MG tablet Take 10 mg by mouth daily.     [provider]  ferrous gluconate (FERGON) 324 MG tablet Take 1 tablet (324 mg total) by mouth daily with breakfast. 11/12/19   Lurene Shadow, MD  furosemide (LASIX) 20 MG tablet Take 1 tablet (20 mg total) by mouth every other day. 11/12/19   Lurene Shadow, MD  insulin lispro protamine-lispro (HUMALOG 75/25 MIX) (75-25) 100 UNIT/ML SUSP injection Inject 55-60 Units into the skin 2 (two) times daily with a meal. Take 55 units before breakfast and 60 units before supper    [provider]  insulin regular (NOVOLIN R) 100 units/mL injection Inject into the skin 3 (three) times daily before meals. Dose per sliding Scale dose max of 10 units daily    [provider]  Insulin Syringe-Needle U-100 (INSULIN SYRINGE .3CC/31GX5/16") 31G X 5/16" 0.3 ML MISC by Does not apply route.    [provider]  latanoprost (XALATAN) 0.005 % ophthalmic  solution Place 1 drop into both eyes at bedtime.  05/28/14   [provider]  LORazepam (ATIVAN) 1 MG tablet Take 1 mg by mouth daily as needed. 09/27/21   [provider]  meclizine (ANTIVERT) 25 MG tablet Take 25 mg by mouth 3 (three) times daily as needed for dizziness.    [provider]  metFORMIN (GLUCOPHAGE) 500 MG tablet Take 1,000 mg by mouth 2 (two) times daily with a meal.     [provider]  metoprolol succinate (TOPROL-XL) 25 MG 24 hr tablet Take 25 mg by mouth 2 (two) times daily.     [provider]  mupirocin ointment (BACTROBAN) 2 % Apply 1 application topically 2 (two) times daily. Patient not taking: Reported on 10/06/2021 12/16/20   Hyatt, Max T, DPM  pantoprazole (PROTONIX) 40 MG tablet Take 40 mg by mouth 2 (two) times daily.     [provider]  simvastatin (ZOCOR) 40 MG tablet Take 40 mg by mouth daily.  [provider]  vitamin B-12 (CYANOCOBALAMIN) 500 MCG tablet Take 500 mcg by mouth daily.    [provider]  warfarin (COUMADIN) 5 MG tablet Take 7.5-10 mg by mouth as directed. Take 10 mg on Tuesday and Thursday. Take 7.5 mg all other days.    [provider]    Physical Exam: BP (!) 162/74   Pulse 69   Temp 98 F (36.7 C) (Oral)   Resp 18   Ht 5\' 10"  (1.778 m)   Wt 106.9 kg   SpO2 98%   BMI 33.82 kg/m   General: 85 y.o. year-old male well developed well nourished in no acute distress.  Alert and oriented x3. Cardiovascular: Regular rate and rhythm with no rubs or gallops.  No thyromegaly or JVD noted.  No lower extremity edema. 2/4 pulses in all 4 extremities. Respiratory: Clear to auscultation with no wheezes or rales. Good inspiratory effort. Abdomen: Soft nontender nondistended with normal bowel sounds x4 quadrants. Muskuloskeletal: No cyanosis, clubbing or edema noted bilaterally Neuro: CN II-XII intact, strength, sensation, reflexes Skin: No ulcerative lesions noted or  rashes Psychiatry: Judgement and insight appear normal. Mood is appropriate for condition and setting          Labs on Admission:  Basic Metabolic Panel: Recent Labs  Lab 10/06/21 1620  NA 137  K 4.5  CL 110  CO2 19*  GLUCOSE 88  BUN 23  CREATININE 1.52*  CALCIUM 8.4*   Liver Function Tests: Recent Labs  Lab 10/06/21 1620  AST 19  ALT 15  ALKPHOS 66  BILITOT 1.1  PROT 7.6  ALBUMIN 3.7   No results for input(s): LIPASE, AMYLASE in the last 168 hours. No results for input(s): AMMONIA in the last 168 hours. CBC: Recent Labs  Lab 10/06/21 1620  WBC 4.7  HGB 6.9*  HCT 22.6*  MCV 103.2*  PLT 135*   Cardiac Enzymes: No results for input(s): CKTOTAL, CKMB, CKMBINDEX, TROPONINI in the last 168 hours.  BNP (last 3 results) No results for input(s): BNP in the last 8760 hours.  ProBNP (last 3 results) No results for input(s): PROBNP in the last 8760 hours.  CBG: No results for input(s): GLUCAP in the last 168 hours.  Radiological Exams on Admission: No results found.  EKG: I independently viewed the EKG done and my findings are as followed: None available at the time of this dictation, ordered.  Assessment/Plan Present on Admission:  GI bleed  Active Problems:   GI bleed  Acute GI bleed, suspect from upper GI  Presented from PCPs office with dyspnea on minimal exertion, generalized weakness and fatigue, intermittent melena, hemoglobin 6.9 K in the ED Baseline hemoglobin 11 K Needs 2 large IV bores placement Transfuse 2 units PRBCs History of gastric angioectasia seen on EGD done in 2020. Received Protonix bolus and started on Protonix drip. Monitor H&H every 6 hours Maintain MAP greater than 65 Transfuse to maintain hemoglobin greater than 8.0 in the setting of underlying cardiac disease. GI consulted by EDP. Started on clear liquid diet, n.p.o. after midnight until seen by GI.  History of gastric angioectasia EGD done on 11/11/2019 by Dr. Marius Ditch  which showed a single nonbleeding angioectasia in the stomach.  Treated with argon plasma coagulation.  Management per GI  History of severe mitral valve regurgitation status post mitral valve annuloplasty on Coumadin Hold off Coumadin for now Cardiology consulted to assist with the management, for anticoagulation recommendations. Closely monitor on telemetry  AKI on CKD  3A Baseline creatinine appears to be 1.2 with GFR 55 Presented with creatinine of 1.52 with GFR 45 Avoid nephrotoxic agents and hypotension Start gentle IV fluid hydration LR at 75 cc/h x 2 days Closely monitor urine output Repeat BMP in the morning  Non anion gap metabolic acidosis in the setting of acute renal insufficiency Serum bicarb 19, anion gap of 8 Continue IV fluid hydration Repeat BMP in the morning  Type 2 diabetes, tightly controlled Obtain hemoglobin A1c Start insulin sliding scale Avoid hypoglycemia  Hyperlipidemia Resume home simvastatin  Essential hypertension Avoid tight control of blood pressure at this time due to concern for GI bleed. Hold off home oral antihypertensives for now. Closely monitor vital signs.  Iron deficiency anemia He is on p.o. iron supplement Hold off for now until seen by GI.  Chronic anxiety/depression Resume home regimen.  Ambulatory dysfunction Uses a cane to ambulate Lives alone PT OT to assess Fall precautions  Critical care time: 65 minutes.   DVT prophylaxis: SCDs  Code Status: Full code  Family Communication: None at bedside  Disposition Plan: Admitted to stepdown unit  Consults called: GI, cardiology.  Admission status: Inpatient status.  Patient will require at least 2 midnights for further evaluation and treatment of present condition.   Status is: Inpatient         Kayleen Memos MD [facility] Pager [phone]  If 7PM-7AM, please contact night-coverage [email or web address] Password [facility]  10/06/2021, 6:08 PM

## 2021-10-06 NOTE — ED Notes (Signed)
Sara RN aware of assigned bed 

## 2021-10-06 NOTE — ED Triage Notes (Signed)
Pt comes into the ED via POV sent by [facility] clinic from Dr. Hyacinth Meeker for rectal bleeding.  Pt had blood work completed showing that his Hgb has dropped from 11 to 7.2.  Pt has h/o rectal bleeding in the past.  Pt only complaint is weakness.  Pt has even and unlabored respirations.

## 2021-10-06 NOTE — ED Notes (Signed)
Pt signed paper copy of blood consent 

## 2021-10-06 NOTE — ED Provider Notes (Signed)
[facility] Emergency Department Provider Note  ____________________________________________  Time seen: Approximately 5:45 PM  I have reviewed the triage vital signs and the nursing notes.   HISTORY  Chief Complaint Rectal Bleeding    HPI Jeffrey Mills is a 85 y.o. male with a past history of CHF diabetes mitral valve replacement on Coumadin who is sent to the ED by PCP due to anemia with a decline of his hemoglobin from a baseline of about 11-7.  Patient reports worsening shortness of breath with exertion, persistent fatigue, and dizziness with standing.  Denies chest pain.  He does report intermittent black stool.  EMR reviewed which showed that he had an episode of anemia 2 years ago, upper endoscopy found gastric angio ectasia.    Past Medical History:  Diagnosis Date  . Anemia   . Anxiety   . Arthritis   . Benign hypertension   . Cardiopulmonary arrest ([place])   . CHF (congestive heart failure) ([place])   . Coronary atherosclerosis of native coronary artery   . Depression   . Diabetes ([place])   . Glaucoma   . Hyperlipidemia   . Mitral valve disorder   . Neuralgia   . PVD (peripheral vascular disease) [facility])      Patient Active Problem List   Diagnosis Date Noted  . Hypersomnia 08/17/2020  . Uncontrolled type 2 diabetes mellitus with hyperglycemia ([place]) 08/17/2020  . 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 Severe anemia 11/08/2019  . Insulin dependent type 2 diabetes mellitus ([place]) 11/08/2019  . CKD (chronic kidney disease) stage 3, GFR 30-59 ml/min (HCC) 11/08/2019  . History of melena 11/08/2019  . Acute on chronic diastolic CHF (congestive heart failure) ([place]) 11/08/2019  . Subtherapeutic international normalized ratio (INR) 11/08/2019  . Atrial fibrillation, chronic ([place]) 11/08/2019  . Chronic anticoagulation 11/08/2019  . Acute kidney injury superimposed on CKD ([place])   . Melena   . B12 deficiency 12/21/2017  . Medicare  annual wellness visit, initial 04/19/2017  . Mitral regurgitation 03/20/2017  . Chronic painful diabetic neuropathy ([place]) 08/16/2016  . Depression, major, recurrent, in partial remission ([place]) 04/11/2016  . Macroalbuminuric diabetic nephropathy ([place]) 12/08/2015  . Combined hyperlipidemia 11/17/2014  . Benign essential hypertension 08/11/2014  . CAD (coronary artery disease), autologous vein bypass graft 08/11/2014  . CAD (coronary artery disease) 03/26/2014  . PVD (peripheral vascular disease) ([place]) 03/26/2014     Past Surgical History:  Procedure Laterality Date  . CHOLECYSTECTOMY    . COLONOSCOPY WITH PROPOFOL N/A 11/11/2019   Procedure: COLONOSCOPY WITH PROPOFOL;  Surgeon: Lin Landsman, MD;  Location: [facility] ENDOSCOPY;  Service: Gastroenterology;  Laterality: N/A;  . CORONARY ANGIOPLASTY WITH STENT PLACEMENT    . CORONARY ARTERY BYPASS GRAFT    . ESOPHAGOGASTRODUODENOSCOPY (EGD) WITH PROPOFOL N/A 11/11/2019   Procedure: ESOPHAGOGASTRODUODENOSCOPY (EGD) WITH PROPOFOL;  Surgeon: Lin Landsman, MD;  Location: [place];  Service: Gastroenterology;  Laterality: N/A;  . MITRAL VALVE ANNULOPLASTY    . TEE WITHOUT CARDIOVERSION N/A 12/05/2018   Procedure: TRANSESOPHAGEAL ECHOCARDIOGRAM (TEE);  Surgeon: Teodoro Spray, MD;  Location: [facility] ORS;  Service: Cardiovascular;  Laterality: N/A;     Prior to Admission medications   Medication Sig Start Date End Date Taking? Authorizing Provider  aspirin 81 MG tablet Take 81 mg by mouth daily.    [provider]  brimonidine (ALPHAGAN) 0.2 % ophthalmic solution Place 1 drop into both eyes 2 (two) times daily.    [provider]  buPROPion (  WELLBUTRIN XL) 150 MG 24 hr tablet Take 150 mg by mouth daily as needed. 09/27/21   [provider]  clindamycin (CLEOCIN) 150 MG capsule Take 1 capsule (150 mg total) by mouth 3 (three) times daily. 12/16/20   Hyatt, Max T, DPM  diphenhydramine-acetaminophen (TYLENOL PM)  25-500 MG TABS tablet Take 1 tablet by mouth at bedtime as needed.    [provider]  escitalopram (LEXAPRO) 10 MG tablet Take 10 mg by mouth daily.     [provider]  ferrous gluconate (FERGON) 324 MG tablet Take 1 tablet (324 mg total) by mouth daily with breakfast. 11/12/19   Lurene Shadow, MD  furosemide (LASIX) 20 MG tablet Take 1 tablet (20 mg total) by mouth every other day. 11/12/19   Lurene Shadow, MD  insulin lispro protamine-lispro (HUMALOG 75/25 MIX) (75-25) 100 UNIT/ML SUSP injection Inject 55-60 Units into the skin 2 (two) times daily with a meal. Take 55 units before breakfast and 60 units before supper    [provider]  insulin regular (NOVOLIN R) 100 units/mL injection Inject into the skin 3 (three) times daily before meals. Dose per sliding Scale dose max of 10 units daily    [provider]  Insulin Syringe-Needle U-100 (INSULIN SYRINGE .3CC/31GX5/16") 31G X 5/16" 0.3 ML MISC by Does not apply route.    [provider]  latanoprost (XALATAN) 0.005 % ophthalmic solution Place 1 drop into both eyes at bedtime.  05/28/14   [provider]  LORazepam (ATIVAN) 1 MG tablet Take 1 mg by mouth daily as needed. 09/27/21   [provider]  meclizine (ANTIVERT) 25 MG tablet Take 25 mg by mouth 3 (three) times daily as needed for dizziness.    [provider]  metFORMIN (GLUCOPHAGE) 500 MG tablet Take 1,000 mg by mouth 2 (two) times daily with a meal.     [provider]  metoprolol succinate (TOPROL-XL) 25 MG 24 hr tablet Take 25 mg by mouth 2 (two) times daily.     [provider]  mupirocin ointment (BACTROBAN) 2 % Apply 1 application topically 2 (two) times daily. 12/16/20   Hyatt, Max T, DPM  pantoprazole (PROTONIX) 40 MG tablet Take 40 mg by mouth 2 (two) times daily.     [provider]  simvastatin (ZOCOR) 40 MG tablet Take 40 mg by mouth daily.    [provider]  vitamin  B-12 (CYANOCOBALAMIN) 500 MCG tablet Take 500 mcg by mouth daily.    [provider]  warfarin (COUMADIN) 5 MG tablet Take 7.5-10 mg by mouth as directed. Take 10 mg on Tuesday and Thursday. Take 7.5 mg all other days.    [provider]     Allergies Captopril, Morphine and related, and Penicillins   Family History  Problem Relation Age of Onset  . Heart disease Father   . ALS Father   . Hypertension Mother   . Breast cancer Mother   . Heart attack Paternal Uncle   . Diabetes Son   . Heart disease Son     Social History Social History   Tobacco Use  . Smoking status: Never  . Smokeless tobacco: Never  Substance Use Topics  . Alcohol use: No  . Drug use: No    Review of Systems  Constitutional:   No fever or chills.  ENT:   No sore throat. No rhinorrhea. Cardiovascular:   No chest pain or syncope. Respiratory:   No dyspnea or cough.  Positive dyspnea with exertion Gastrointestinal:   Negative for abdominal pain, vomiting and diarrhea.  Musculoskeletal:   Negative for focal pain or swelling All other systems reviewed and are negative except as documented above in ROS and HPI.  ____________________________________________   PHYSICAL EXAM:  VITAL SIGNS: ED Triage Vitals  Enc Vitals Group     BP 10/06/21 1632 (!) 162/74     Pulse Rate 10/06/21 1632 69     Resp 10/06/21 1632 18     Temp 10/06/21 1632 98 F (36.7 C)     Temp Source 10/06/21 1632 Oral     SpO2 10/06/21 1632 98 %     Weight 10/06/21 1618 235 lb 10.8 oz (106.9 kg)     Height 10/06/21 1618 5\' 10"  (1.778 m)     Head Circumference --      Peak Flow --      Pain Score 10/06/21 1618 0     Pain Loc --      Pain Edu? --      Excl. in [place]? --     Vital signs reviewed, nursing assessments reviewed.   Constitutional:   Alert and oriented. Non-toxic appearance. Eyes:   Conjunctivae are pale. EOMI. PERRL. ENT      Head:   Normocephalic and atraumatic.      Nose:   Wearing a mask.       Mouth/Throat:   Wearing a mask.      Neck:   No meningismus. Full ROM. Hematological/Lymphatic/Immunilogical:   No cervical lymphadenopathy. Cardiovascular:   RRR. Symmetric bilateral radial and DP pulses.  No murmurs. Cap refill less than 2 seconds. Respiratory:   Normal respiratory effort without tachypnea/retractions. Breath sounds are clear and equal bilaterally. No wheezes/rales/rhonchi. Gastrointestinal:   Soft and nontender. Non distended. There is no CVA tenderness.  No rebound, rigidity, or guarding.  Rectal exam shows small external hemorrhoids which were not inflamed thrombosed or bleeding.  Nontender.  Minimal sample present which is Hemoccult positive. Genitourinary:   deferred Musculoskeletal:   Normal range of motion in all extremities. No joint effusions.  No lower extremity tenderness.  No edema. Neurologic:   Normal speech and language.  Motor grossly intact. No acute focal neurologic deficits are appreciated.  Skin:    Skin is warm, dry and intact. No rash noted.  No petechiae, purpura, or bullae.  ____________________________________________    LABS (pertinent positives/negatives) (all labs ordered are listed, but only abnormal results are displayed) Labs Reviewed  CBC - Abnormal; Notable for the following components:      Result Value   RBC 2.19 (*)    Hemoglobin 6.9 (*)    HCT 22.6 (*)    MCV 103.2 (*)    RDW 17.1 (*)    Platelets 135 (*)    All other components within normal limits  PROTIME-INR - Abnormal; Notable for the following components:   Prothrombin Time 19.5 (*)    INR 1.7 (*)    All other components within normal limits  RESP PANEL BY RT-PCR (FLU A&B, COVID) ARPGX2  COMPREHENSIVE METABOLIC PANEL  POC OCCULT BLOOD, ED  TYPE AND SCREEN  PREPARE RBC (CROSSMATCH)   ____________________________________________   EKG    ____________________________________________    RADIOLOGY  No results  found.  ____________________________________________   PROCEDURES .Critical Care Performed by: Carrie Mew, MD Authorized by: Carrie Mew, MD   Critical care provider statement:    Critical care time (minutes):  35   Critical care time was  exclusive of:  Separately billable procedures and treating other patients   Critical care was necessary to treat or prevent imminent or life-threatening deterioration of the following conditions:  Circulatory failure and shock   Critical care was time spent personally by me on the following activities:  Development of treatment plan with patient or surrogate, discussions with consultants, evaluation of patient's response to treatment, examination of patient, obtaining history from patient or surrogate, ordering and performing treatments and interventions, ordering and review of laboratory studies, ordering and review of radiographic studies, pulse oximetry, re-evaluation of patient's condition and review of old charts  ____________________________________________    CLINICAL IMPRESSION / ASSESSMENT AND PLAN / ED COURSE  Medications ordered in the ED: Medications  0.9 %  sodium chloride infusion (has no administration in time range)  pantoprazole (PROTONIX) injection 40 mg (has no administration in time range)    Pertinent labs & imaging results that were available during my care of the patient were reviewed by me and considered in my medical decision making (see chart for details).  Jeffrey Mills was evaluated in Emergency Department on 10/06/2021 for the symptoms described in the history of present illness. He was evaluated in the context of the global COVID-19 pandemic, which necessitated consideration that the patient might be at risk for infection with the SARS-CoV-2 virus that causes COVID-19. Institutional protocols and algorithms that pertain to the evaluation of patients at risk for COVID-19 are in a state of rapid change based  on information released by regulatory bodies including the CDC and federal and state organizations. These policies and algorithms were followed during the patient's care in the ED.   Patient presents with occult GI bleed and symptomatic anemia.  Hemoglobin today is 6.9.  INR 1.7.  Does not require urgent anticoagulant reversal.  Will transfuse 1 unit packed red blood cells, hospitalize for serial hemoglobin monitoring and symptomatic improvement and GI evaluation.  Clinical Course as of 10/06/21 Vernelle Emerald  Wed Oct 06, 2021  1821 Coumadin discussed with cardiology Dr. Saralyn Pilar who confirms it is appropriate to hold warfarin for now, especially since prosthetic mitral valve is not mechanical. [PS]    Clinical Course User Index [PS] Carrie Mew, MD     ____________________________________________   FINAL CLINICAL IMPRESSION(S) / ED DIAGNOSES    Final diagnoses:  Gastrointestinal hemorrhage, unspecified gastrointestinal hemorrhage type  Symptomatic anemia  Warfarin anticoagulation  Type 2 diabetes mellitus without complication, with long-term current use of insulin [facility])     ED Discharge Orders     None       Portions of this note were generated with dragon dictation software. Dictation errors may occur despite best attempts at proofreading.    Carrie Mew, MD 10/06/21 [phone]

## 2021-10-07 ENCOUNTER — Inpatient Hospital Stay: Payer: Medicare Other

## 2021-10-07 DIAGNOSIS — E119 Type 2 diabetes mellitus without complications: Secondary | ICD-10-CM | POA: Diagnosis not present

## 2021-10-07 DIAGNOSIS — D649 Anemia, unspecified: Secondary | ICD-10-CM | POA: Diagnosis not present

## 2021-10-07 DIAGNOSIS — Z794 Long term (current) use of insulin: Secondary | ICD-10-CM

## 2021-10-07 DIAGNOSIS — I10 Essential (primary) hypertension: Secondary | ICD-10-CM

## 2021-10-07 DIAGNOSIS — K922 Gastrointestinal hemorrhage, unspecified: Secondary | ICD-10-CM | POA: Diagnosis not present

## 2021-10-07 DIAGNOSIS — Z7901 Long term (current) use of anticoagulants: Secondary | ICD-10-CM

## 2021-10-07 DIAGNOSIS — I482 Chronic atrial fibrillation, unspecified: Secondary | ICD-10-CM

## 2021-10-07 LAB — HEMOGLOBIN AND HEMATOCRIT, BLOOD
HCT: 25.7 % — ABNORMAL LOW (ref 39.0–52.0)
HCT: 27.2 % — ABNORMAL LOW (ref 39.0–52.0)
HCT: 24.7 % — ABNORMAL LOW (ref 39.0–52.0)
HCT: 26.8 % — ABNORMAL LOW (ref 39.0–52.0)
Hemoglobin: 8.3 g/dL — ABNORMAL LOW (ref 13.0–17.0)
Hemoglobin: 8.6 g/dL — ABNORMAL LOW (ref 13.0–17.0)
Hemoglobin: 7.9 g/dL — ABNORMAL LOW (ref 13.0–17.0)
Hemoglobin: 8.7 g/dL — ABNORMAL LOW (ref 13.0–17.0)

## 2021-10-07 LAB — HEMOGLOBIN A1C
Hgb A1c MFr Bld: 6.9 % — ABNORMAL HIGH (ref 4.8–5.6)
Mean Plasma Glucose: 151.33 mg/dL

## 2021-10-07 LAB — PROTIME-INR
INR: 1.7 — ABNORMAL HIGH (ref 0.8–1.2)
Prothrombin Time: 20.4 seconds — ABNORMAL HIGH (ref 11.4–15.2)

## 2021-10-07 LAB — GLUCOSE, CAPILLARY
Glucose-Capillary: 102 mg/dL — ABNORMAL HIGH (ref 70–99)
Glucose-Capillary: 110 mg/dL — ABNORMAL HIGH (ref 70–99)
Glucose-Capillary: 56 mg/dL — ABNORMAL LOW (ref 70–99)
Glucose-Capillary: 65 mg/dL — ABNORMAL LOW (ref 70–99)
Glucose-Capillary: 65 mg/dL — ABNORMAL LOW (ref 70–99)
Glucose-Capillary: 73 mg/dL (ref 70–99)
Glucose-Capillary: 78 mg/dL (ref 70–99)
Glucose-Capillary: 81 mg/dL (ref 70–99)
Glucose-Capillary: 88 mg/dL (ref 70–99)
Glucose-Capillary: 98 mg/dL (ref 70–99)

## 2021-10-07 LAB — BASIC METABOLIC PANEL
Anion gap: 5 (ref 5–15)
BUN: 20 mg/dL (ref 8–23)
CO2: 22 mmol/L (ref 22–32)
Calcium: 8.5 mg/dL — ABNORMAL LOW (ref 8.9–10.3)
Chloride: 111 mmol/L (ref 98–111)
Creatinine, Ser: 1.47 mg/dL — ABNORMAL HIGH (ref 0.61–1.24)
GFR, Estimated: 47 mL/min — ABNORMAL LOW (ref >60)
Glucose, Bld: 103 mg/dL — ABNORMAL HIGH (ref 70–99)
Potassium: 4.5 mmol/L (ref 3.5–5.1)
Sodium: 138 mmol/L (ref 135–145)

## 2021-10-07 LAB — MRSA NEXT GEN BY PCR, NASAL: MRSA by PCR Next Gen: NOT DETECTED

## 2021-10-07 LAB — PROCALCITONIN: Procalcitonin: <0.1 ng/mL

## 2021-10-07 MED ORDER — IPRATROPIUM-ALBUTEROL 0.5-2.5 (3) MG/3ML IN SOLN: 3.0000 mL | RESPIRATORY_TRACT | Status: DC | PRN

## 2021-10-07 MED ORDER — DEXTROSE 50 % IV SOLN
12.5000 g | INTRAVENOUS | Status: AC
  Administered 2021-10-07: 12.5 g via INTRAVENOUS

## 2021-10-07 MED ORDER — LATANOPROST 0.005 % OP SOLN
1.0000 [drp] | Freq: Every day | OPHTHALMIC | Status: DC
  Administered 2021-10-07 – 2021-10-10 (×4): 1 [drp] via OPHTHALMIC
  Filled 2021-10-07: qty 2.5

## 2021-10-07 MED ORDER — DEXTROSE-NACL 5-0.9 % IV SOLN: INTRAVENOUS | Status: DC

## 2021-10-07 MED ORDER — DEXTROSE 50 % IV SOLN
12.5000 g | INTRAVENOUS | Status: AC
  Administered 2021-10-07: 12.5 g via INTRAVENOUS
  Filled 2021-10-07: qty 50

## 2021-10-07 MED ORDER — DEXTROSE 10 % IV SOLN: INTRAVENOUS | Status: DC

## 2021-10-07 MED ORDER — BUPROPION HCL ER (XL) 150 MG PO TB24
150.0000 mg | ORAL_TABLET | Freq: Every day | ORAL | Status: DC | PRN
  Administered 2021-10-08: 150 mg via ORAL
  Filled 2021-10-07 (×2): qty 1

## 2021-10-07 MED ORDER — FUROSEMIDE 10 MG/ML IJ SOLN
40.0000 mg | Freq: Once | INTRAMUSCULAR | Status: AC
  Administered 2021-10-07: 40 mg via INTRAVENOUS
  Filled 2021-10-07: qty 4

## 2021-10-07 MED ORDER — METOPROLOL SUCCINATE ER 50 MG PO TB24: 25.0000 mg | ORAL_TABLET | Freq: Every day | ORAL | Status: DC

## 2021-10-07 MED ORDER — CHLORHEXIDINE GLUCONATE CLOTH 2 % EX PADS
6.0000 | MEDICATED_PAD | Freq: Every day | CUTANEOUS | Status: DC
  Administered 2021-10-07 – 2021-10-10 (×3): 6 via TOPICAL

## 2021-10-07 MED ORDER — METOPROLOL TARTRATE 25 MG PO TABS
12.5000 mg | ORAL_TABLET | Freq: Two times a day (BID) | ORAL | Status: DC
  Administered 2021-10-07 – 2021-10-08 (×4): 12.5 mg via ORAL
  Filled 2021-10-07 (×4): qty 1

## 2021-10-07 NOTE — Consult Note (Signed)
CARDIOLOGY CONSULT NOTE               Patient ID: Jeffrey Mills MRN: [id number] DOB/AGE: 03/12/1936 85 y.o.  Admit date: 10/06/2021 Referring Physician Margo Aye Primary Physician [facility] Primary Cardiologist [place] Reason for Consultation GI bleed, on warfarin  HPI: 85 year old gentleman referred for evaluation of GI bleed on warfarin.  The patient has a history of coronary artery disease status post CABG x4 with mitral valve repair in 1999, coronary stents, most recently in the proximal LAD 06/2011, hypertension, hyperlipidemia, chronic atrial fibrillation on warfarin, and history of prior GI bleed 10/2019 with history of gastric angioectasias.  The patient presented to [facility] ER due to symptomatic anemia with hemoglobin drop from 10.5 in April to 7.2.  The patient had complaints of melena, a 2-3 week history of dyspnea with minimal exertion, fatigue, and dizziness with standing. Prior to symptom onset, the patient was in his usual state of health, denying chest pain or shortness of breath, and was doing his normal activities around the house.  2D echocardiogram 07/01/2020 revealed normal left ventricular function, with LVEF grain 55% with calculated aortic valve area 2.48 cm, peak velocity 2.21 m/s, peak gradient 19.6 mmHg and mean gradient 10.6 mmHg.   Review of systems complete and found to be negative unless listed above     Past Medical History:  Diagnosis Date   Anemia    Anxiety    Arthritis    Benign hypertension    Cardiopulmonary arrest (HCC)    CHF (congestive heart failure) (HCC)    Coronary atherosclerosis of native coronary artery    Depression    Diabetes (HCC)    Glaucoma    Hyperlipidemia    Mitral valve disorder    Neuralgia    PVD (peripheral vascular disease) (HCC)     Past Surgical History:  Procedure Laterality Date   CHOLECYSTECTOMY     COLONOSCOPY WITH PROPOFOL N/A 11/11/2019   Procedure: COLONOSCOPY WITH PROPOFOL;  Surgeon: Toney Reil,  MD;  Location: [facility] ENDOSCOPY;  Service: Gastroenterology;  Laterality: N/A;   CORONARY ANGIOPLASTY WITH STENT PLACEMENT     CORONARY ARTERY BYPASS GRAFT     ESOPHAGOGASTRODUODENOSCOPY (EGD) WITH PROPOFOL N/A 11/11/2019   Procedure: ESOPHAGOGASTRODUODENOSCOPY (EGD) WITH PROPOFOL;  Surgeon: Toney Reil, MD;  Location: [facility] ENDOSCOPY;  Service: Gastroenterology;  Laterality: N/A;   MITRAL VALVE ANNULOPLASTY     TEE WITHOUT CARDIOVERSION N/A 12/05/2018   Procedure: TRANSESOPHAGEAL ECHOCARDIOGRAM (TEE);  Surgeon: Dalia Heading, MD;  Location: [facility] ORS;  Service: Cardiovascular;  Laterality: N/A;    Medications Prior to Admission  Medication Sig Dispense Refill Last Dose   aspirin 81 MG tablet Take 81 mg by mouth daily.   10/06/2021   brimonidine (ALPHAGAN) 0.2 % ophthalmic solution Place 1 drop into both eyes 2 (two) times daily.   10/06/2021   buPROPion (WELLBUTRIN XL) 150 MG 24 hr tablet Take 150 mg by mouth daily as needed.   10/06/2021   escitalopram (LEXAPRO) 10 MG tablet Take 10 mg by mouth daily.    10/06/2021   ferrous gluconate (FERGON) 324 MG tablet Take 1 tablet (324 mg total) by mouth daily with breakfast.  3 10/06/2021 at 1000   insulin lispro protamine-lispro (HUMALOG 75/25 MIX) (75-25) 100 UNIT/ML SUSP injection Inject 65-75 Units into the skin 2 (two) times daily with a meal. Take 65 units before breakfast and 75 units before supper   10/06/2021 at 1000   insulin regular (NOVOLIN R) 100 units/mL  injection Inject into the skin 3 (three) times daily before meals. Dose per sliding Scale dose max of 10 units daily   10/06/2021 at 1000   latanoprost (XALATAN) 0.005 % ophthalmic solution Place 1 drop into both eyes at bedtime.    10/05/2021   LORazepam (ATIVAN) 1 MG tablet Take 1 mg by mouth daily as needed.   10/05/2021 at 2000   metFORMIN (GLUCOPHAGE) 500 MG tablet Take 500 mg by mouth daily with breakfast.   10/05/2021   metoprolol succinate (TOPROL-XL) 25 MG 24 hr tablet Take 25 mg by  mouth 2 (two) times daily.    10/06/2021 at 1000   pantoprazole (PROTONIX) 40 MG tablet Take 40 mg by mouth 2 (two) times daily.    10/06/2021   simvastatin (ZOCOR) 40 MG tablet Take 40 mg by mouth daily.   10/05/2021 at 2000   vitamin B-12 (CYANOCOBALAMIN) 500 MCG tablet Take 500 mcg by mouth daily.   Past Month   warfarin (COUMADIN) 5 MG tablet Take 7.5-10 mg by mouth as directed. Take 10 mg on Tuesday and Thursday. Take 7.5 mg all other days.   10/06/2021 at 0800   clindamycin (CLEOCIN) 150 MG capsule Take 1 capsule (150 mg total) by mouth 3 (three) times daily. (Patient not taking: No sig reported) 30 capsule 1 Not Taking   diphenhydramine-acetaminophen (TYLENOL PM) 25-500 MG TABS tablet Take 1 tablet by mouth at bedtime as needed. (Patient not taking: No sig reported)   Not Taking   furosemide (LASIX) 20 MG tablet Take 1 tablet (20 mg total) by mouth every other day. (Patient not taking: No sig reported) 30 tablet 0 Not Taking   Insulin Syringe-Needle U-100 (INSULIN SYRINGE .3CC/31GX5/16") 31G X 5/16" 0.3 ML MISC by Does not apply route.      meclizine (ANTIVERT) 25 MG tablet Take 25 mg by mouth 3 (three) times daily as needed for dizziness.   prn at unknown   mupirocin ointment (BACTROBAN) 2 % Apply 1 application topically 2 (two) times daily. (Patient not taking: No sig reported) 22 g 0 Not Taking   Social History   Socioeconomic History   Marital status: Widowed    Spouse name: Not on file   Number of children: 2   Years of education: Not on file   Highest education level: Not on file  Occupational History   Occupation: retired  Tobacco Use   Smoking status: Never   Smokeless tobacco: Never  Substance and Sexual Activity   Alcohol use: No   Drug use: No   Sexual activity: Not on file  Other Topics Concern   Not on file  Social History Narrative   Not on file   Social Determinants of Health   Financial Resource Strain: Not on file  Food Insecurity: Not on file  Transportation  Needs: Not on file  Physical Activity: Not on file  Stress: Not on file  Social Connections: Not on file  Intimate Partner Violence: Not on file    Family History  Problem Relation Age of Onset   Heart disease Father    ALS Father    Hypertension Mother    Breast cancer Mother    Heart attack Paternal Uncle    Diabetes Son    Heart disease Son       Review of systems complete and found to be negative unless listed above      PHYSICAL EXAM  General: Well developed, well nourished, in no acute distress lying in bed  HEENT:  Normocephalic and atramatic Neck:  No JVD.  Lungs: Clear bilaterally to auscultation  Heart: HRRR . Normal S1 and S2 without gallops or murmurs.  Abdomen: abdomen soft  Extremities: No clubbing, cyanosis or edema.   Neuro: Alert and oriented X 3. Psych:  Good affect, responds appropriately  Labs:   Lab Results  Component Value Date   WBC 4.7 10/06/2021   HGB 8.6 (L) 10/07/2021   HCT 27.2 (L) 10/07/2021   MCV 103.2 (H) 10/06/2021   PLT 135 (L) 10/06/2021    Recent Labs  Lab 10/06/21 1620  NA 137  K 4.5  CL 110  CO2 19*  BUN 23  CREATININE 1.52*  CALCIUM 8.4*  PROT 7.6  BILITOT 1.1  ALKPHOS 66  ALT 15  AST 19  GLUCOSE 88   Lab Results  Component Value Date   CKTOTAL 97 07/24/2017   TROPONINI <0.03 12/02/2018   No results found for: CHOL No results found for: HDL No results found for: LDLCALC No results found for: TRIG No results found for: CHOLHDL No results found for: LDLDIRECT    Radiology: No results found.   ASSESSMENT AND PLAN:  Acute GI bleed with hemoglobin dropped from 10.5 to 7.2 with melena, dyspnea with minimal exertion, and positional dizziness.  The patient is on warfarin for chronic atrial fibrillation.  He has a history of GI bleed in 2020. Chronic atrial fibrillation on warfarin, and metoprolol at home for rate control Mitral valve repair in 1999 Coronary artery disease, status post CABG x4 in 1999 and  stent in OM1 (02/2009) and proximal LAD (06/2011)  Plan: Hold warfarin, which patient is taking for atrial fibrillation, not mitral valve repair. No bridge necessary. Resume as soon as safely possible. Resume metoprolol succinate at 25 mg once daily for rate control Hold aspirin Continue statin  Signed:  Leanora Ivanoff PA-C 10/07/2021, 8:21 AM

## 2021-10-07 NOTE — Consult Note (Addendum)
Inpatient Consultation   Patient ID: Jeffrey Mills is a 85 y.o. male.  Requesting Provider: Dow Adolph, DO  Date of Admission: 10/06/2021  Date of Consult: 10/07/21   Reason for Consultation: Melena, GIB Was alerted to this urgent consult at 14:39 today (consult order yesterday evening). I spoke to the on call provider from last night and no GI provider was notified regarding this consult until the aforementioned time  Patient's Chief Complaint:   Chief Complaint  Patient presents with   Rectal Bleeding    Jeffrey Mills is a 85 year old Caucasian male with A. fib on Coumadin, mitral valve regurgitation, IDA secondary to chronic blood loss, DM 2, CAD status post stenting, CKD who presents to the hospital at the request of his PCP for symptomatic anemia.  Gastroenterology was consulted for GI bleeding.  Patient saw his PCP who noted hemoglobin drop from 11 to 7.2.  He had noted dark stools and was not actively on his iron supplementation and denies Pepto-Bismol.  Patient also noted dyspnea both with exertion and at rest, fatigue and orthostasis.  He notes multiple dark bowel movements but without any bright red blood which is different from his presentation in 2020.  He denies abdominal pain changes in appetite changes weight dysphagia odynophagia nausea vomiting hematemesis and coffee-ground emesis.  In the ED he was found to have a hemoglobin of 6.9 and was given 2 units of PRBCs with appropriate rise 8.6.  Notably his MCV was 103- less consistent with his h/o IDA.  His most recent test demonstrates hemoglobin of 7.9.  BUN/creatinine is 20: 1.47.  He is actively receiving Protonix drip after bolus.  Vital signs have remained stable throughout his hospitalization.  On coumadin- currently being held. Presenting INR 1.7.  Also taking baby aspirin.  He denies any recent steroid or antibiotic use Denies NSAIDs Denies family history of gastrointestinal disease and malignancy Previous  Endoscopies: 10/2019 -EGD and Colonoscopy - Dr. Allegra Lai- single non bleeding avm found in stomach treated with APC; Diverticulosis noted in the left colon   Past Medical History:  Diagnosis Date   Anemia    Anxiety    Arthritis    Benign hypertension    Cardiopulmonary arrest (HCC)    CHF (congestive heart failure) (HCC)    Coronary atherosclerosis of native coronary artery    Depression    Diabetes (HCC)    Glaucoma    Hyperlipidemia    Mitral valve disorder    Neuralgia    PVD (peripheral vascular disease) (HCC)     Past Surgical History:  Procedure Laterality Date   CHOLECYSTECTOMY     COLONOSCOPY WITH PROPOFOL N/A 11/11/2019   Procedure: COLONOSCOPY WITH PROPOFOL;  Surgeon: Toney Reil, MD;  Location: [facility] END[facility]COPY;  Service: Gastroenterology;  Laterality: N/A;   CORONARY ANGIOPLASTY WITH STENT PLACEMENT     CORONARY ARTERY BYPASS GRAFT     ESOPHAGOGASTRODUODENOSCOPY (EGD) WITH PROPOFOL N/A 11/11/2019   Procedure: ESOPHAGOGASTRODUODENOSCOPY (EGD) WITH PROPOFOL;  Surgeon: Toney Reil, MD;  Location: [facility] ENDOSCOPY;  Service: Gastroenterology;  Laterality: N/A;   MITRAL VALVE ANNULOPLASTY     TEE WITHOUT CARDIOVERSION N/A 12/05/2018   Procedure: TRANSESOPHAGEAL ECHOCARDIOGRAM (TEE);  Surgeon: Dalia Heading, MD;  Location: [facility] ORS;  Service: Cardiovascular;  Laterality: N/A;    Allergies  Allergen Reactions   Captopril Other (See Comments)    Reaction: unknown   Morphine And Related Other (See Comments)    Reaction: unknown   Penicillins Other (See  Comments)    Reaction: unknown Did it involve swelling of the face/tongue/throat, SOB, or low BP? Unknown Did it involve sudden or severe rash/hives, skin peeling, or any reaction on the inside of your mouth or nose? Unknown Did you need to seek medical attention at a hospital or doctor's office? Unknown When did it last happen? unknown If all above answers are "NO", may proceed with cephalosporin use.      Family History  Problem Relation Age of Onset   Heart disease Father    ALS Father    Hypertension Mother    Breast cancer Mother    Heart attack Paternal Uncle    Diabetes Son    Heart disease Son     Social History   Tobacco Use   Smoking status: Never   Smokeless tobacco: Never  Substance Use Topics   Alcohol use: No   Drug use: No     Pertinent GI related history and allergies were reviewed with the patient  Review of Systems  Constitutional:  Positive for activity change. Negative for appetite change, chills, fatigue, fever and unexpected weight change.  HENT:  Negative for trouble swallowing and voice change.   Respiratory:  Positive for shortness of breath. Negative for wheezing.   Cardiovascular:  Negative for chest pain, palpitations and leg swelling.  Gastrointestinal:  Positive for blood in stool. Negative for abdominal distention, abdominal pain, anal bleeding, constipation, diarrhea, nausea, rectal pain and vomiting.  Musculoskeletal:  Negative for arthralgias and myalgias.  Skin:  Negative for color change and pallor.  Neurological:  Positive for dizziness, weakness and light-headedness. Negative for syncope.  Psychiatric/Behavioral:  Negative for confusion. The patient is not nervous/anxious.   All other systems reviewed and are negative.   Medications Home Medications No current facility-administered medications on file prior to encounter.   Current Outpatient Medications on File Prior to Encounter  Medication Sig Dispense Refill   aspirin 81 MG tablet Take 81 mg by mouth daily.     brimonidine (ALPHAGAN) 0.2 % ophthalmic solution Place 1 drop into both eyes 2 (two) times daily.     buPROPion (WELLBUTRIN XL) 150 MG 24 hr tablet Take 150 mg by mouth daily as needed.     escitalopram (LEXAPRO) 10 MG tablet Take 10 mg by mouth daily.      ferrous gluconate (FERGON) 324 MG tablet Take 1 tablet (324 mg total) by mouth daily with breakfast.  3   insulin  lispro protamine-lispro (HUMALOG 75/25 MIX) (75-25) 100 UNIT/ML SUSP injection Inject 65-75 Units into the skin 2 (two) times daily with a meal. Take 65 units before breakfast and 75 units before supper     insulin regular (NOVOLIN R) 100 units/mL injection Inject into the skin 3 (three) times daily before meals. Dose per sliding Scale dose max of 10 units daily     latanoprost (XALATAN) 0.005 % ophthalmic solution Place 1 drop into both eyes at bedtime.      LORazepam (ATIVAN) 1 MG tablet Take 1 mg by mouth daily as needed.     metFORMIN (GLUCOPHAGE) 500 MG tablet Take 500 mg by mouth daily with breakfast.     metoprolol succinate (TOPROL-XL) 25 MG 24 hr tablet Take 25 mg by mouth 2 (two) times daily.      pantoprazole (PROTONIX) 40 MG tablet Take 40 mg by mouth 2 (two) times daily.      simvastatin (ZOCOR) 40 MG tablet Take 40 mg by mouth daily.  vitamin B-12 (CYANOCOBALAMIN) 500 MCG tablet Take 500 mcg by mouth daily.     warfarin (COUMADIN) 5 MG tablet Take 7.5-10 mg by mouth as directed. Take 10 mg on Tuesday and Thursday. Take 7.5 mg all other days.     clindamycin (CLEOCIN) 150 MG capsule Take 1 capsule (150 mg total) by mouth 3 (three) times daily. (Patient not taking: No sig reported) 30 capsule 1   diphenhydramine-acetaminophen (TYLENOL PM) 25-500 MG TABS tablet Take 1 tablet by mouth at bedtime as needed. (Patient not taking: No sig reported)     furosemide (LASIX) 20 MG tablet Take 1 tablet (20 mg total) by mouth every other day. (Patient not taking: No sig reported) 30 tablet 0   Insulin Syringe-Needle U-100 (INSULIN SYRINGE .3CC/31GX5/16") 31G X 5/16" 0.3 ML MISC by Does not apply route.     meclizine (ANTIVERT) 25 MG tablet Take 25 mg by mouth 3 (three) times daily as needed for dizziness.     mupirocin ointment (BACTROBAN) 2 % Apply 1 application topically 2 (two) times daily. (Patient not taking: No sig reported) 22 g 0   Pertinent GI related medications were reviewed with the  patient  Inpatient Medications  Current Facility-Administered Medications:    0.9 %  sodium chloride infusion, 10 mL/hr, Intravenous, Once, Sharman Cheek, MD, Held at 10/06/21 1845   brimonidine (ALPHAGAN) 0.2 % ophthalmic solution 1 drop, 1 drop, Both Eyes, BID, Hall, Carole N, DO, 1 drop at 10/07/21 1026   Chlorhexidine Gluconate Cloth 2 % PADS 6 each, 6 each, Topical, Q0600, Darlin Drop, DO, 6 each at 10/07/21 1026   escitalopram (LEXAPRO) tablet 10 mg, 10 mg, Oral, Daily, Hall, Carole N, DO, 10 mg at 10/07/21 1022   lactated ringers infusion, , Intravenous, Continuous, Hall, Carole N, DO, Held at 10/06/21 2108   LORazepam (ATIVAN) tablet 0.5 mg, 0.5 mg, Oral, Daily PRN, Margo Aye, Carole N, DO, 0.5 mg at 10/06/21 2125   metoprolol tartrate (LOPRESSOR) tablet 12.5 mg, 12.5 mg, Oral, BID, Jamelle Rushing L, MD, 12.5 mg at 10/07/21 1022   [START ON 10/10/2021] pantoprazole (PROTONIX) injection 40 mg, 40 mg, Intravenous, Q12H, Hall, Carole N, DO   pantoprozole (PROTONIX) 80 mg /NS 100 mL infusion, 8 mg/hr, Intravenous, Continuous, Hall, Carole N, DO, Last Rate: 10 mL/hr at 10/07/21 1449, 8 mg/hr at 10/07/21 1449   simvastatin (ZOCOR) tablet 40 mg, 40 mg, Oral, Daily, Dow Adolph N, DO, 40 mg at 10/07/21 1024  sodium chloride Stopped (10/06/21 1845)   lactated ringers Stopped (10/06/21 2108)   pantoprazole 8 mg/hr (10/07/21 1449)    LORazepam   Objective   Vitals:   10/07/21 0739 10/07/21 0800 10/07/21 1022 10/07/21 1200  BP: (!) 165/93 (!) 147/72 (!) 166/92 139/67  Pulse: 90 68 70 63  Resp: Temp: 97.8 F (36.6 C)   98 F (36.7 C)  TempSrc: Oral   Axillary  SpO2: 100% 97%  98%  Weight:      Height:        Physical Exam Vitals and nursing note reviewed.  Constitutional:      General: He is not in acute distress.    Appearance: Normal appearance. He is not ill-appearing, toxic-appearing or diaphoretic.  HENT:     Head: Normocephalic and atraumatic.     Nose:  Nose normal.     Mouth/Throat:     Mouth: Mucous membranes are moist.     Pharynx: Oropharynx is clear.  Comments: Upper dentures.  No teeth on the bottom Eyes:     General: No scleral icterus.    Extraocular Movements: Extraocular movements intact.     Comments: Conjunctival pallor  Cardiovascular:     Rate and Rhythm: Normal rate and regular rhythm.     Heart sounds: Murmur heard.    No friction rub. No gallop.  Pulmonary:     Effort: Pulmonary effort is normal. No respiratory distress.     Breath sounds: Normal breath sounds. No wheezing, rhonchi or rales.  Abdominal:     General: Bowel sounds are normal. There is no distension.     Palpations: Abdomen is soft.     Tenderness: There is no abdominal tenderness. There is no guarding or rebound.  Musculoskeletal:     Cervical back: Neck supple.     Right lower leg: No edema.     Left lower leg: No edema.  Skin:    General: Skin is warm and dry.     Coloration: Skin is not jaundiced or pale.  Neurological:     General: No focal deficit present.     Mental Status: He is alert and oriented to person, place, and time. Mental status is at baseline.  Psychiatric:        Mood and Affect: Mood normal.        Behavior: Behavior normal.        Thought Content: Thought content normal.        Judgment: Judgment normal.    Laboratory Data Recent Labs  Lab 10/06/21 1620 10/06/21 1955 10/07/21 0258 10/07/21 0622 10/07/21 1212  WBC 4.7  --   --   --   --   HGB 6.9*   < > 8.3* 8.6* 7.9*  HCT 22.6*   < > 25.7* 27.2* 24.7*  PLT 135*  --   --   --   --    < > = values in this interval not displayed.   Recent Labs  Lab 10/06/21 1620 10/07/21 1212  NA 137 138  K 4.5 4.5  CL 110 111  CO2 19* 22  BUN 23 20  CALCIUM 8.4* 8.5*  PROT 7.6  --   BILITOT 1.1  --   ALKPHOS 66  --   ALT 15  --   AST 19  --   GLUCOSE 88 103*   Recent Labs  Lab 10/06/21 1620 10/07/21 1212  INR 1.7* 1.7*     Imaging Studies: No results  found.  Assessment:   # Symptomatic Anemia- suspected 2/2 UGIB source - pt p/w melena - hgb down from 11.7 to 6.9 on presentation - s/p 2 u prbc with improvement to 8.6 - VSS through hospitalization - macrocytosis noted on initial labs - h/o avm in stomach 2020  # Suspected UGIB - melena present - BUN/CR 20:1.47 - asa use and coumadin; no nsaids or tobacco -Harbinger Score (10/07/2021):  1 1) No Daily PPI in preceding week (+1); 2) Shock index (HR/SBP)>1 (+1); BUN/Creatinine >30 (+1) [0-home, 1-obs, 2-scope]  # Afib on chronic anticoagulation- coumadin -INR 1.7  # CAD s/p pci and stent placement # h/o gastric avm # Diverticulosis # CKD # DM2 # HF # Mitral valve regurg  Plan:  Plan for EGD tomorrow pending patient stability and endoscopy unit availability Npo at midnight. Ok for sips and chips until then Continue iv protonix 40 mg iv bid Maintain two sites iv access Monitor H&H: No more than every 8 hours: Transfusion  and resuscitation as per primary team Coumadin on hold; hold dvt ppx Labs in am- cbc, bmp,k inr Supportive care as per primary team Discussed case with nursing Spoke with patient's daughter on the phone and informed her of patient condition and plan for which she was amenable If patient becomes hemodynamically unstable recommend stat CTA to localize bleeding source In addition to coumadin and asa, pt on lexapro which can lead to poor platelet function and contribute to GI bleeding  Esophagogastroduodenoscopy with possible biopsy, control of bleeding, polypectomy, and interventions as necessary has been discussed with the patient/patient representative. Informed consent was obtained from the patient/patient representative after explaining the indication, nature, and risks of the procedure including but not limited to death, bleeding, perforation, missed neoplasm/lesions, cardiorespiratory compromise, and reaction to medications. Opportunity for questions was  given and appropriate answers were provided. Patient/patient representative has verbalized understanding is amenable to undergoing the procedure.  Refer to forthcoming EGD procedure note for endoscopic findings and any additional recommendations.    I personally performed the service.  Management of other medical comorbidities as per primary team  Thank you for allowing [place] to participate in this patient's care. Please don't hesitate to call if any questions or concerns arise.   Jaynie Collins, DO [facility]  Portions of the record may have been created with voice recognition software. Occasional wrong-word or 'sound-a-like' substitutions may have occurred due to the inherent limitations of voice recognition software.  Read the chart carefully and recognize, using context, where substitutions may have occurred.

## 2021-10-07 NOTE — Progress Notes (Signed)
PROGRESS NOTE  GILLERMO PALLER    DOB: [birth year], 85 y.o.  OJJ:[id number]  PCP: Danella Penton, MD   Code Status: Full Code   DOA: 10/06/2021   LOS: 1  Brief Narrative of Current Hospitalization  EARLIS BIZZELL is a 85 y.o. male with a PMH significant for A. fib on Coumadin, iron deficiency anemia due to chronic blood loss, HLD, type II DM, CAD s/p PCI with stenting, CKDIII, severe mitral regurgitation, prior MI, mitral valve annuloplasty. They presented from home after abnormal labs from PCP office to the ED on 10/06/2021 with acute decline in hemoglobin from 11-7.2 in the setting of melena, DOE, positive orthostatics and fatigue.  Last EGD and colonoscopy 10/2019.  In the ED, it was found that they had hemoglobin 6.9.  Transfused with 2 units PRBCs as well as started on IV Protonix.  GI was consulted. Patient was admitted to medicine service for further workup and management of GI bleed as outlined in detail below.  10/07/21 -stable  Assessment & Plan  Active Problems:   GI bleed  Acute GI bleed-hemoglobin remaining stable at 8.3> 8.6 s/p 2 units PRBCs yesterday.  Vital signs remained stable.  Transfusion threshold is 8.  Denies any bowel movements today. -GI following, appreciate recommendations -Continue IV Protonix -CBC a.m.  A. Fib  mitral valve annuloplasty  CAD  HLD  HTN- on Coumadin.  INR 1.7 on admission.  HAS-BLED score 4. Blood pressures elevated since admission - cardiology following, appreciate recommendations -Repeat INR -Continue holding Coumadin, ASA - continue home simvastatin - continue home metoprolol at reduced dose to avoid hemodynamic instability -Holding home Lasix -Continuous cardiac monitoring  AKI on CKD IIIa- baseline around 1.2. Admission Cr 1.52 likely from hypovolemia.  receiving fluid rehydration.  - repeat BMP pending  Type II DM- last A1c was 2020 6.7. patient hypoglycemic to 56 this am but asymptomatic and responded well to dextrose.  Discontinue sliding scale insulin, especially while NPO and can reassess once on full diet.  - repeat A1c add on  Iron deficiency anemia- not taking iron supplement for several weeks so unlikely to contribute to his finding of melena. -CBC a.m.  Anxiety/depression-chronic, stable -Continue home bupropion, Lexapro, as needed lorazepam  DVT prophylaxis: SCDs Start: 10/06/21 1807   Diet:  Diet Orders (From admission, onward)     Start     Ordered   10/07/21 0001  Diet NPO time specified  Diet effective midnight        10/06/21 1807            Subjective 10/07/21    Pt reports feeling much improved today.  Denies pain or concerns at this time.  Disposition Plan & Communication  Patient status: Inpatient  Admitted From: Home Disposition: Home Anticipated discharge date: 11/12  Family Communication: None Consults, Procedures, Significant Events  Consultants:  Cardiology GI  Procedures/significant events:  None at this time Antimicrobials:  Anti-infectives (From admission, onward)    None       Objective   Vitals:   10/07/21 0600 10/07/21 0700 10/07/21 0739 10/07/21 0800  BP: (!) 170/84 (!) 178/93 (!) 165/93 (!) 147/72  Pulse: 66 70 90 68  Resp: 17 16 14 15   Temp:   97.8 F (36.6 C)   TempSrc:   Oral   SpO2: 98% 98% 100% 97%  Weight:      Height:        Intake/Output Summary (Last 24 hours) at 10/07/2021 9371 Last data filed  at 10/07/2021 0745 Gross per 24 hour  Intake 509.7 ml  Output 620 ml  Net -110.3 ml   Filed Weights   10/06/21 1618  Weight: 106.9 kg    Patient BMI: Body mass index is 33.82 kg/m.   Physical Exam: General: awake, alert, NAD HEENT: atraumatic, clear conjunctiva, anicteric sclera, moist mucus membranes, hearing grossly normal Respiratory: normal respiratory effort. Cardiovascular: normal S1/S2,  RRR, no JVD, murmurs, rubs, gallops, quick capillary refill  Gastrointestinal: soft, NT, ND, no HSM felt Nervous: A&O x3. no  gross focal neurologic deficits, normal speech Extremities: moves all equall, no edema, normal tone Skin: dry, intact, normal temperature, normal color, No rashes, lesions or ulcers Psychiatry: normal mood, congruent affect  Labs   I have personally reviewed following labs and imaging studies Admission on 10/06/2021  Component Date Value Ref Range Status   Sodium 10/06/2021 137  135 - 145 mmol/L Final   Potassium 10/06/2021 4.5  3.5 - 5.1 mmol/L Final   Chloride 10/06/2021 110  98 - 111 mmol/L Final   CO2 10/06/2021 19 (A)  22 - 32 mmol/L Final   Glucose, Bld 10/06/2021 88  70 - 99 mg/dL Final   BUN 93/81/0175 23  8 - 23 mg/dL Final   Creatinine, Ser 10/06/2021 1.52 (A)  0.61 - 1.24 mg/dL Final   Calcium 09/21/8526 8.4 (A)  8.9 - 10.3 mg/dL Final   Total Protein 78/24/2353 7.6  6.5 - 8.1 g/dL Final   Albumin 61/44/3154 3.7  3.5 - 5.0 g/dL Final   AST 00/86/7619 19  15 - 41 U/L Final   ALT 10/06/2021 15  0 - 44 U/L Final   Alkaline Phosphatase 10/06/2021 66  38 - 126 U/L Final   Total Bilirubin 10/06/2021 1.1  0.3 - 1.2 mg/dL Final   GFR, Estimated 10/06/2021 45 (A)  >60 mL/min Final   Anion gap 10/06/2021 8  5 - 15 Final   WBC 10/06/2021 4.7  4.0 - 10.5 K/uL Final   RBC 10/06/2021 2.19 (A)  4.22 - 5.81 MIL/uL Final   Hemoglobin 10/06/2021 6.9 (A)  13.0 - 17.0 g/dL Final   HCT 50/93/2671 22.6 (A)  39.0 - 52.0 % Final   MCV 10/06/2021 103.2 (A)  80.0 - 100.0 fL Final   MCH 10/06/2021 31.5  26.0 - 34.0 pg Final   MCHC 10/06/2021 30.5  30.0 - 36.0 g/dL Final   RDW 24/58/0998 17.1 (A)  11.5 - 15.5 % Final   Platelets 10/06/2021 135 (A)  150 - 400 K/uL Final   nRBC 10/06/2021 0.0  0.0 - 0.2 % Final   ABO/RH(D) 10/06/2021 A NEG   Final   Antibody Screen 10/06/2021 POS   Final   Sample Expiration 10/06/2021 10/09/2021,2359   Final   Antibody Identification 10/06/2021    Final                   Value:ANTI D ANTI C Performed at [facility], [address], [place],  [place] [address]    Unit Number 10/06/2021 [id number]   Final   Blood Component Type 10/06/2021 RED CELLS,LR   Final   Unit division 10/06/2021 00   Final   Status of Unit 10/06/2021 ISSUED   Final   Transfusion Status 10/06/2021 OK TO TRANSFUSE   Final   Crossmatch Result 10/06/2021 COMPATIBLE   Final   Unit Number 10/06/2021 [id number]   Final   Blood Component Type 10/06/2021 RED CELLS,LR   Final   Unit  division 10/06/2021 00   Final   Status of Unit 10/06/2021 ISSUED   Final   Transfusion Status 10/06/2021 OK TO TRANSFUSE   Final   Crossmatch Result 10/06/2021 COMPATIBLE   Final   Unit Number 10/06/2021 [id number]   Final   Blood Component Type 10/06/2021 RBC LR PHER1   Final   Unit division 10/06/2021 00   Final   Status of Unit 10/06/2021 ALLOCATED   Final   Transfusion Status 10/06/2021 OK TO TRANSFUSE   Final   Crossmatch Result 10/06/2021 COMPATIBLE   Final   Unit Number 10/06/2021 [id number]   Final   Blood Component Type 10/06/2021 RED CELLS,LR   Final   Unit division 10/06/2021 00   Final   Status of Unit 10/06/2021 ALLOCATED   Final   Transfusion Status 10/06/2021 OK TO TRANSFUSE   Final   Crossmatch Result 10/06/2021 COMPATIBLE   Final   Prothrombin Time 10/06/2021 19.5 (A)  11.4 - 15.2 seconds Final   INR 10/06/2021 1.7 (A)  0.8 - 1.2 Final   Order Confirmation 10/06/2021    Final                   Value:ORDER PROCESSED BY BLOOD BANK Performed at [facility], [address] [place], [place] [address]    SARS Coronavirus 2 by RT PCR 10/06/2021 NEGATIVE  NEGATIVE Final   Influenza A by PCR 10/06/2021 NEGATIVE  NEGATIVE Final   Influenza B by PCR 10/06/2021 NEGATIVE  NEGATIVE Final   Hemoglobin 10/06/2021 6.9 (A)  13.0 - 17.0 g/dL Final   HCT 77/41/2878 21.9 (A)  39.0 - 52.0 % Final   Hemoglobin 10/07/2021 8.3 (A)  13.0 - 17.0 g/dL Final   HCT 67/67/2094 25.7 (A)  39.0 - 52.0 % Final   Hemoglobin 10/07/2021 8.6 (A)  13.0 - 17.0 g/dL Final   HCT  70/96/2836 27.2 (A)  39.0 - 52.0 % Final   Glucose-Capillary 10/06/2021 76  70 - 99 mg/dL Final   ISSUE DATE / TIME 10/06/2021 [id number]   Final   Blood Product Unit Number 10/06/2021 [id number]   Final   PRODUCT CODE 10/06/2021 [id number]   Final   Unit Type and Rh 10/06/2021 0600   Final   Blood Product Expiration Date 10/06/2021 [id number]   Final   ISSUE DATE / TIME 10/06/2021 [id number]   Final   Blood Product Unit Number 10/06/2021 [id number]   Final   PRODUCT CODE 10/06/2021 [id number]   Final   Unit Type and Rh 10/06/2021 0600   Final   Blood Product Expiration Date 10/06/2021 [id number]   Final   Blood Product Unit Number 10/06/2021 [id number]   Final   PRODUCT CODE 10/06/2021 [id number]   Final   Unit Type and Rh 10/06/2021 0600   Final   Blood Product Expiration Date 10/06/2021 [id number]   Final   Blood Product Unit Number 10/06/2021 [id number]   Final   PRODUCT CODE 10/06/2021 [id number]   Final   Unit Type and Rh 10/06/2021 0600   Final   Blood Product Expiration Date 10/06/2021 [id number]   Final   MRSA by PCR Next Gen 10/07/2021 NOT DETECTED  NOT DETECTED Final   Glucose-Capillary 10/07/2021 110 (A)  70 - 99 mg/dL Final   Comment 1 70/48/8891 Notify RN   Final   Comment 2 10/07/2021 Document in Chart   Final   Glucose-Capillary 10/07/2021 81  70 - 99 mg/dL Final   Comment 1 69/45/0388 Notify RN  Final   Comment 2 10/07/2021 Document in Chart   Final   Glucose-Capillary 10/07/2021 56 (A)  70 - 99 mg/dL Final   Glucose-Capillary 10/07/2021 98  70 - 99 mg/dL Final    Imaging Studies  No results found. Medications   Scheduled Meds:  brimonidine  1 drop Both Eyes BID   Chlorhexidine Gluconate Cloth  6 each Topical Q0600   escitalopram  10 mg Oral Daily   [START ON 10/10/2021] pantoprazole  40 mg Intravenous Q12H   simvastatin  40 mg Oral Daily   No recently discontinued medications to reconcile  LOS: 1 day   Time spent: >59min  Leeroy Bock, DO [facility] 10/07/2021, 8:26 AM   Please refer to amion to contact the [facility] Attending or Consulting provider for this pt  [email or web address] Available by Epic secure chat 7AM-7PM. If 7PM-7AM, please contact night-coverage

## 2021-10-07 NOTE — Progress Notes (Signed)
Patient A/O x4. In NSR, afebrile, with BPS> 165. MD  made aware, will continue to monitor per MD. Patient NPO, with ice chips and sips with meds per MD. Hypoglycemia protocol started, medication given for low Bgs per protocol. Intervention effective, MD aware.

## 2021-10-08 ENCOUNTER — Encounter
Admission: EM | Disposition: A | Discharge status: Home / Self Care | Payer: Self-pay | Source: Home / Self Care | Attending: Student in an Organized Health Care Education/Training Program

## 2021-10-08 ENCOUNTER — Inpatient Hospital Stay: Payer: Medicare Other | Admitting: Certified Registered"

## 2021-10-08 ENCOUNTER — Encounter: Payer: Self-pay | Admitting: Internal Medicine

## 2021-10-08 DIAGNOSIS — E119 Type 2 diabetes mellitus without complications: Secondary | ICD-10-CM | POA: Diagnosis not present

## 2021-10-08 DIAGNOSIS — K922 Gastrointestinal hemorrhage, unspecified: Secondary | ICD-10-CM | POA: Diagnosis not present

## 2021-10-08 DIAGNOSIS — I10 Essential (primary) hypertension: Secondary | ICD-10-CM | POA: Diagnosis not present

## 2021-10-08 DIAGNOSIS — D649 Anemia, unspecified: Secondary | ICD-10-CM | POA: Diagnosis not present

## 2021-10-08 HISTORY — PX: ESOPHAGOGASTRODUODENOSCOPY (EGD) WITH PROPOFOL: SHX5813

## 2021-10-08 LAB — CBC
HCT: 27.8 % — ABNORMAL LOW (ref 39.0–52.0)
Hemoglobin: 8.8 g/dL — ABNORMAL LOW (ref 13.0–17.0)
MCH: 30.7 pg (ref 26.0–34.0)
MCHC: 31.7 g/dL (ref 30.0–36.0)
MCV: 96.9 fL (ref 80.0–100.0)
Platelets: 127 10*3/uL — ABNORMAL LOW (ref 150–400)
RBC: 2.87 MIL/uL — ABNORMAL LOW (ref 4.22–5.81)
RDW: 18.2 % — ABNORMAL HIGH (ref 11.5–15.5)
WBC: 6 10*3/uL (ref 4.0–10.5)
nRBC: 0 % (ref 0.0–0.2)

## 2021-10-08 LAB — BASIC METABOLIC PANEL
Anion gap: 7 (ref 5–15)
BUN: 19 mg/dL (ref 8–23)
CO2: 23 mmol/L (ref 22–32)
Calcium: 8.9 mg/dL (ref 8.9–10.3)
Chloride: 108 mmol/L (ref 98–111)
Creatinine, Ser: 1.44 mg/dL — ABNORMAL HIGH (ref 0.61–1.24)
GFR, Estimated: 48 mL/min — ABNORMAL LOW (ref >60)
Glucose, Bld: 114 mg/dL — ABNORMAL HIGH (ref 70–99)
Potassium: 4.3 mmol/L (ref 3.5–5.1)
Sodium: 138 mmol/L (ref 135–145)

## 2021-10-08 LAB — GLUCOSE, CAPILLARY
Glucose-Capillary: 115 mg/dL — ABNORMAL HIGH (ref 70–99)
Glucose-Capillary: 110 mg/dL — ABNORMAL HIGH (ref 70–99)
Glucose-Capillary: 114 mg/dL — ABNORMAL HIGH (ref 70–99)
Glucose-Capillary: 105 mg/dL — ABNORMAL HIGH (ref 70–99)
Glucose-Capillary: 155 mg/dL — ABNORMAL HIGH (ref 70–99)
Glucose-Capillary: 139 mg/dL — ABNORMAL HIGH (ref 70–99)

## 2021-10-08 LAB — HEMOGLOBIN AND HEMATOCRIT, BLOOD
HCT: 27.5 % — ABNORMAL LOW (ref 39.0–52.0)
HCT: 27.7 % — ABNORMAL LOW (ref 39.0–52.0)
Hemoglobin: 9 g/dL — ABNORMAL LOW (ref 13.0–17.0)
Hemoglobin: 9 g/dL — ABNORMAL LOW (ref 13.0–17.0)

## 2021-10-08 LAB — PROTIME-INR
INR: 1.6 — ABNORMAL HIGH (ref 0.8–1.2)
Prothrombin Time: 19.3 seconds — ABNORMAL HIGH (ref 11.4–15.2)

## 2021-10-08 SURGERY — ESOPHAGOGASTRODUODENOSCOPY (EGD) WITH PROPOFOL
Anesthesia: General

## 2021-10-08 MED ORDER — GLYCOPYRROLATE 0.2 MG/ML IJ SOLN
INTRAMUSCULAR | Status: DC | PRN
  Administered 2021-10-08: .2 mg via INTRAVENOUS

## 2021-10-08 MED ORDER — PROPOFOL 500 MG/50ML IV EMUL
INTRAVENOUS | Status: DC | PRN
  Administered 2021-10-08: 145 ug/kg/min via INTRAVENOUS

## 2021-10-08 MED ORDER — SODIUM CHLORIDE 0.9 % IV SOLN: INTRAVENOUS | Status: DC

## 2021-10-08 MED ORDER — PROPOFOL 10 MG/ML IV BOLUS
INTRAVENOUS | Status: DC | PRN
  Administered 2021-10-08: 60 mg via INTRAVENOUS
  Administered 2021-10-08: 10 mg via INTRAVENOUS

## 2021-10-08 MED ORDER — PANTOPRAZOLE SODIUM 40 MG PO TBEC
40.0000 mg | DELAYED_RELEASE_TABLET | Freq: Every day | ORAL | Status: DC
  Administered 2021-10-08 – 2021-10-11 (×4): 40 mg via ORAL
  Filled 2021-10-08 (×4): qty 1

## 2021-10-08 MED ORDER — PEG 3350-KCL-NA BICARB-NACL 420 G PO SOLR
2000.0000 mL | Freq: Once | ORAL | Status: DC | PRN
  Filled 2021-10-08: qty 4000

## 2021-10-08 MED ORDER — PEG 3350-KCL-NA BICARB-NACL 420 G PO SOLR
2000.0000 mL | Freq: Once | ORAL | Status: AC
  Administered 2021-10-08: 2000 mL via ORAL
  Filled 2021-10-08: qty 4000

## 2021-10-08 MED ORDER — SODIUM CHLORIDE 0.9 % IV SOLN: INTRAVENOUS | Status: DC | PRN

## 2021-10-08 MED ORDER — LIDOCAINE HCL (CARDIAC) PF 100 MG/5ML IV SOSY
PREFILLED_SYRINGE | INTRAVENOUS | Status: DC | PRN
  Administered 2021-10-08: 100 mg via INTRAVENOUS

## 2021-10-08 NOTE — Progress Notes (Signed)
[facility] Cardiology  SUBJECTIVE: Patient laying in bed, denies chest pain   Vitals:   10/08/21 0300 10/08/21 0400 10/08/21 0500 10/08/21 0600  BP: (!) 160/78 (!) 152/74  (!) 152/76  Pulse: 89 85 78 81  Resp: (!) 24 (!) 21    Temp:      TempSrc:      SpO2: 94% 96% 93% 94%  Weight:      Height:         Intake/Output Summary (Last 24 hours) at 10/08/2021 0752 Last data filed at 10/08/2021 9924 Gross per 24 hour  Intake 773.56 ml  Output 5425 ml  Net -4651.44 ml      PHYSICAL EXAM  General: Well developed, well nourished, in no acute distress HEENT:  Normocephalic and atramatic Neck:  No JVD.  Lungs: Clear bilaterally to auscultation and percussion. Heart: HRRR . Normal S1 and S2 without gallops or murmurs.  Abdomen: Bowel sounds are positive, abdomen soft and non-tender  Msk:  Back normal, normal gait. Normal strength and tone for age. Extremities: No clubbing, cyanosis or edema.   Neuro: Alert and oriented X 3. Psych:  Good affect, responds appropriately   LABS: Basic Metabolic Panel: Recent Labs    10/07/21 1212 10/08/21 0438  NA 138 138  K 4.5 4.3  CL 111 108  CO2 22 23  GLUCOSE 103* 114*  BUN 20 19  CREATININE 1.47* 1.44*  CALCIUM 8.5* 8.9   Liver Function Tests: Recent Labs    10/06/21 1620  AST 19  ALT 15  ALKPHOS 66  BILITOT 1.1  PROT 7.6  ALBUMIN 3.7   No results for input(s): LIPASE, AMYLASE in the last 72 hours. CBC: Recent Labs    10/06/21 1620 10/06/21 1955 10/08/21 0009 10/08/21 0438  WBC 4.7  --   --  6.0  HGB 6.9*   < > 9.0* 8.8*  HCT 22.6*   < > 27.5* 27.8*  MCV 103.2*  --   --  96.9  PLT 135*  --   --  127*   < > = values in this interval not displayed.   Cardiac Enzymes: No results for input(s): CKTOTAL, CKMB, CKMBINDEX, TROPONINI in the last 72 hours. BNP: Invalid input(s): POCBNP D-Dimer: No results for input(s): DDIMER in the last 72 hours. Hemoglobin A1C: Recent Labs    10/07/21 1212  HGBA1C 6.9*   Fasting  Lipid Panel: No results for input(s): CHOL, HDL, LDLCALC, TRIG, CHOLHDL, LDLDIRECT in the last 72 hours. Thyroid Function Tests: No results for input(s): TSH, T4TOTAL, T3FREE, THYROIDAB in the last 72 hours.  Invalid input(s): FREET3 Anemia Panel: No results for input(s): VITAMINB12, FOLATE, FERRITIN, TIBC, IRON, RETICCTPCT in the last 72 hours.  DG Chest Port 1 View  Result Date: 10/07/2021 CLINICAL DATA:  Wheezing. EXAM: PORTABLE CHEST 1 VIEW COMPARISON:  Chest radiograph dated 11/11/2019. FINDINGS: Mild cardiomegaly with vascular congestion and mild edema. Pneumonia is not excluded. Clinical correlation is recommended. No focal consolidation, pleural effusion or pneumothorax. Atherosclerotic calcification of the aorta. Median sternotomy wires and CABG vascular clips. No acute osseous pathology. IMPRESSION: Mild cardiomegaly with vascular congestion and mild edema. Pneumonia is not excluded. Electronically Signed   By: Elgie Collard M.D.   On: 10/07/2021 21:57     Echo LVEF greater than 55% 07/01/2020  TELEMETRY: Atrial fibrillation 80 bpm:  ASSESSMENT AND PLAN:  Active Problems:   Symptomatic anemia   Type 2 diabetes mellitus without complication, with long-term current use of insulin (HCC)  Warfarin anticoagulation   Primary hypertension   GI bleed    Acute GI bleed with hemoglobin dropped from 10.5 to 7.2 with melena, dyspnea with minimal exertion, and positional dizziness.  The patient is on warfarin for chronic atrial fibrillation.  He has a history of GI bleed in 2020.  EGD later today. Chronic atrial fibrillation on warfarin, and metoprolol at home for rate control Mitral valve repair in 1999 Coronary artery disease, status post CABG x4 in 1999 and stent in OM1 (02/2009) and proximal LAD (06/2011)   Recommendations  Hold warfarin, which patient is taking for atrial fibrillation, not mitral valve repair. No bridge necessary. Resume as soon as safely possible after pleated  GI work-up and resolution of GI bleed. Continue metoprolol to tartrate Hold aspirin Continue simvastatin No further cardiac diagnostics at this time   Marcina Millard, MD, PhD, [facility] 10/08/2021 7:52 AM

## 2021-10-08 NOTE — Transfer of Care (Signed)
Immediate Anesthesia Transfer of Care Note  Patient: Jeffrey Mills  Procedure(s) Performed: ESOPHAGOGASTRODUODENOSCOPY (EGD) WITH PROPOFOL  Patient Location: Endoscopy Unit  Anesthesia Type:General  Level of Consciousness: drowsy and patient cooperative  Airway & Oxygen Therapy: Patient Spontanous Breathing and Patient connected to face mask oxygen  Post-op Assessment: Report given to RN and Post -op Vital signs reviewed and stable  Post vital signs: Reviewed and stable  Last Vitals:  Vitals Value Taken Time  BP 104/64 10/08/21 1112  Temp 36.7 C 10/08/21 1112  Pulse 101 10/08/21 1113  Resp 17 10/08/21 1113  SpO2 98 % 10/08/21 1113  Vitals shown include unvalidated device data.  Last Pain:  Vitals:   10/08/21 1112  TempSrc: Temporal  PainSc: Asleep         Complications: No notable events documented.

## 2021-10-08 NOTE — Progress Notes (Signed)
 Inpatient Follow-up/Progress Note   Patient ID: Jeffrey Mills is a 85 y.o. male.  Overnight Events / Subjective Findings Seen in endoscopy unit. NPO since midnight. Denies further dark stools. No n/v. VSS. Hgb stable overnight at 9.0. No other acute gi complaints.  Review of Systems  Constitutional:  Negative for activity change, appetite change, chills, fatigue, fever and unexpected weight change.  HENT:  Negative for trouble swallowing and voice change.   Respiratory:  Negative for shortness of breath.   Cardiovascular:  Negative for chest pain and palpitations.  Gastrointestinal:  Positive for blood in stool (melena). Negative for abdominal distention, abdominal pain, anal bleeding, constipation, diarrhea, nausea and vomiting.  Musculoskeletal:  Negative for arthralgias and myalgias.  Skin:  Negative for color change and pallor.  Neurological:  Negative for dizziness, syncope and weakness.  Psychiatric/Behavioral:  Negative for confusion. The patient is not nervous/anxious.   All other systems reviewed and are negative.   Medications  Current Facility-Administered Medications:    0.9 %  sodium chloride infusion, 10 mL/hr, Intravenous, Once, Stafford, Phillip, MD, Held at 10/06/21 1845   brimonidine (ALPHAGAN) 0.2 % ophthalmic solution 1 drop, 1 drop, Both Eyes, BID, Hall, Carole N, DO, 1 drop at 10/07/21 2105   buPROPion (WELLBUTRIN XL) 24 hr tablet 150 mg, 150 mg, Oral, Daily PRN, Anderson, Chelsey L, MD   Chlorhexidine Gluconate Cloth 2 % PADS 6 each, 6 each, Topical, Q0600, Hall, Carole N, DO, 6 each at 10/07/21 1026   dextrose 10 % infusion, , Intravenous, Continuous, Morrison, Brenda, NP, Last Rate: 25 mL/hr at 10/08/21 0500, Infusion Verify at 10/08/21 0500   escitalopram (LEXAPRO) tablet 10 mg, 10 mg, Oral, Daily, Hall, Carole N, DO, 10 mg at 10/07/21 1022   ipratropium-albuterol (DUONEB) 0.5-2.5 (3) MG/3ML nebulizer solution 3 mL, 3 mL, Nebulization, Q4H PRN, Morrison,  Brenda, NP   latanoprost (XALATAN) 0.005 % ophthalmic solution 1 drop, 1 drop, Both Eyes, QHS, Anderson, Chelsey L, MD, 1 drop at 10/07/21 2106   LORazepam (ATIVAN) tablet 0.5 mg, 0.5 mg, Oral, Daily PRN, Hall, Carole N, DO, 0.5 mg at 10/07/21 2103   metoprolol tartrate (LOPRESSOR) tablet 12.5 mg, 12.5 mg, Oral, BID, Anderson, Chelsey L, MD, 12.5 mg at 10/07/21 2103   [START ON 10/10/2021] pantoprazole (PROTONIX) injection 40 mg, 40 mg, Intravenous, Q12H, Hall, Carole N, DO   pantoprozole (PROTONIX) 80 mg /NS 100 mL infusion, 8 mg/hr, Intravenous, Continuous, Hall, Carole N, DO, Last Rate: 10 mL/hr at 10/08/21 0500, 8 mg/hr at 10/08/21 0500   simvastatin (ZOCOR) tablet 40 mg, 40 mg, Oral, Daily, Hall, Carole N, DO, 40 mg at 10/07/21 1024  sodium chloride Stopped (10/06/21 1845)   dextrose 25 mL/hr at 10/08/21 0500   pantoprazole 8 mg/hr (10/08/21 0500)    buPROPion, ipratropium-albuterol, LORazepam   Objective    Vitals:   10/08/21 0300 10/08/21 0400 10/08/21 0500 10/08/21 0600  BP: (!) 160/78 (!) 152/74  (!) 152/76  Pulse: 89 85 78 81  Resp: (!) 24 (!) 21    Temp:      TempSrc:      SpO2: 94% 96% 93% 94%  Weight:      Height:         Physical Exam Vitals and nursing note reviewed.  Constitutional:      General: He is not in acute distress.    Appearance: Normal appearance. He is not ill-appearing, toxic-appearing or diaphoretic.  HENT:     Head: Normocephalic and atraumatic.       Nose: Nose normal.     Mouth/Throat:     Mouth: Mucous membranes are moist.     Pharynx: Oropharynx is clear.     Comments: edentulous  Eyes:     General: No scleral icterus.    Extraocular Movements: Extraocular movements intact.  Cardiovascular:     Rate and Rhythm: Normal rate and regular rhythm.     Heart sounds: Murmur heard.    No friction rub. No gallop.  Pulmonary:     Effort: Pulmonary effort is normal. No respiratory distress.     Breath sounds: Normal breath sounds. No wheezing,  rhonchi or rales.  Abdominal:     General: Bowel sounds are normal. There is no distension.     Palpations: Abdomen is soft.     Tenderness: There is no abdominal tenderness. There is no guarding or rebound.  Musculoskeletal:     Cervical back: Neck supple.     Right lower leg: No edema.     Left lower leg: No edema.  Skin:    General: Skin is warm and dry.     Coloration: Skin is not jaundiced or pale.  Neurological:     General: No focal deficit present.     Mental Status: He is alert and oriented to person, place, and time. Mental status is at baseline.  Psychiatric:        Mood and Affect: Mood normal.        Behavior: Behavior normal.        Thought Content: Thought content normal.        Judgment: Judgment normal.     Laboratory Data Recent Labs  Lab 10/06/21 1620 10/06/21 1955 10/07/21 1752 10/08/21 0009 10/08/21 0438  WBC 4.7  --   --   --  6.0  HGB 6.9*   < > 8.7* 9.0* 8.8*  HCT 22.6*   < > 26.8* 27.5* 27.8*  PLT 135*  --   --   --  127*   < > = values in this interval not displayed.   Recent Labs  Lab 10/06/21 1620 10/07/21 1212 10/08/21 0438  NA 137 138 138  K 4.5 4.5 4.3  CL 110 111 108  CO2 19* 22 23  BUN 23 20 19  CREATININE 1.52* 1.47* 1.44*  CALCIUM 8.4* 8.5* 8.9  PROT 7.6  --   --   BILITOT 1.1  --   --   ALKPHOS 66  --   --   ALT 15  --   --   AST 19  --   --   GLUCOSE 88 103* 114*   Recent Labs  Lab 10/06/21 1620 10/07/21 1212 10/08/21 0438  INR 1.7* 1.7* 1.6*      Imaging Studies: DG Chest Port 1 View  Result Date: 10/07/2021 CLINICAL DATA:  Wheezing. EXAM: PORTABLE CHEST 1 VIEW COMPARISON:  Chest radiograph dated 11/11/2019. FINDINGS: Mild cardiomegaly with vascular congestion and mild edema. Pneumonia is not excluded. Clinical correlation is recommended. No focal consolidation, pleural effusion or pneumothorax. Atherosclerotic calcification of the aorta. Median sternotomy wires and CABG vascular clips. No acute osseous  pathology. IMPRESSION: Mild cardiomegaly with vascular congestion and mild edema. Pneumonia is not excluded. Electronically Signed   By: Arash  Radparvar M.D.   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 Npo since midnight Morning labs reviewed Continue iv protonix 40 mg iv bid Maintain two sites iv access Monitor H&H: No more than every 8 hours: Transfusion and resuscitation as per primary team Coumadin on hold; hold dvt ppx Supportive care as per primary team If patient becomes hemodynamically unstable recommend stat CTA to localize bleeding source In addition to coumadin and asa, pt on lexapro which can lead to poor platelet function and contribute to GI bleeding   Esophagogastroduodenoscopy with possible biopsy, control of bleeding, polypectomy, and interventions as necessary has been discussed with the patient/patient representative. Informed consent was obtained from the patient/patient representative after explaining the indication, nature, and risks of the procedure including but not limited to death, bleeding, perforation, missed neoplasm/lesions, cardiorespiratory compromise, and reaction to medications. Opportunity for questions was given and appropriate answers were provided. Patient/patient  representative has verbalized understanding is amenable to undergoing the procedure.   Refer to forthcoming EGD procedure note for endoscopic findings and any additional recommendations.    I personally performed the service.  Management of other medical comorbidities as per primary team  Thank you for allowing [place] to participate in this patient's care. Please don't hesitate to call if any questions or concerns arise.   Jaynie Collins, DO [facility]  Portions of the record may have been created with voice recognition software. Occasional wrong-word or 'sound-a-like' substitutions may have occurred due to the inherent limitations of voice recognition software.  Read the chart carefully and recognize, using context, where substitutions may have occurred.

## 2021-10-08 NOTE — H&P (View-Only) (Signed)
Inpatient Follow-up/Progress Note   Patient ID: Jeffrey Mills is a 85 y.o. male.  Overnight Events / Subjective Findings Seen in endoscopy unit. NPO since midnight. Denies further dark stools. No n/v. VSS. Hgb stable overnight at 9.0. No other acute gi complaints.  Review of Systems  Constitutional:  Negative for activity change, appetite change, chills, fatigue, fever and unexpected weight change.  HENT:  Negative for trouble swallowing and voice change.   Respiratory:  Negative for shortness of breath.   Cardiovascular:  Negative for chest pain and palpitations.  Gastrointestinal:  Positive for blood in stool (melena). Negative for abdominal distention, abdominal pain, anal bleeding, constipation, diarrhea, nausea and vomiting.  Musculoskeletal:  Negative for arthralgias and myalgias.  Skin:  Negative for color change and pallor.  Neurological:  Negative for dizziness, syncope and weakness.  Psychiatric/Behavioral:  Negative for confusion. The patient is not nervous/anxious.   All other systems reviewed and are negative.   Medications  Current Facility-Administered Medications:    0.9 %  sodium chloride infusion, 10 mL/hr, Intravenous, Once, Sharman Cheek, MD, Held at 10/06/21 1845   brimonidine (ALPHAGAN) 0.2 % ophthalmic solution 1 drop, 1 drop, Both Eyes, BID, Hall, Carole N, DO, 1 drop at 10/07/21 2105   buPROPion (WELLBUTRIN XL) 24 hr tablet 150 mg, 150 mg, Oral, Daily PRN, Leeroy Bock, MD   Chlorhexidine Gluconate Cloth 2 % PADS 6 each, 6 each, Topical, Q0600, Darlin Drop, DO, 6 each at 10/07/21 1026   dextrose 10 % infusion, , Intravenous, Continuous, Manuela Schwartz, NP, Last Rate: 25 mL/hr at 10/08/21 0500, Infusion Verify at 10/08/21 0500   escitalopram (LEXAPRO) tablet 10 mg, 10 mg, Oral, Daily, Hall, Carole N, DO, 10 mg at 10/07/21 1022   ipratropium-albuterol (DUONEB) 0.5-2.5 (3) MG/3ML nebulizer solution 3 mL, 3 mL, Nebulization, Q4H PRN, Manuela Schwartz, NP   latanoprost (XALATAN) 0.005 % ophthalmic solution 1 drop, 1 drop, Both Eyes, QHS, Leeroy Bock, MD, 1 drop at 10/07/21 2106   LORazepam (ATIVAN) tablet 0.5 mg, 0.5 mg, Oral, Daily PRN, Margo Aye, Carole N, DO, 0.5 mg at 10/07/21 2103   metoprolol tartrate (LOPRESSOR) tablet 12.5 mg, 12.5 mg, Oral, BID, Leeroy Bock, MD, 12.5 mg at 10/07/21 2103   [START ON 10/10/2021] pantoprazole (PROTONIX) injection 40 mg, 40 mg, Intravenous, Q12H, Hall, Carole N, DO   pantoprozole (PROTONIX) 80 mg /NS 100 mL infusion, 8 mg/hr, Intravenous, Continuous, Hall, Carole N, DO, Last Rate: 10 mL/hr at 10/08/21 0500, 8 mg/hr at 10/08/21 0500   simvastatin (ZOCOR) tablet 40 mg, 40 mg, Oral, Daily, Dow Adolph N, DO, 40 mg at 10/07/21 1024  sodium chloride Stopped (10/06/21 1845)   dextrose 25 mL/hr at 10/08/21 0500   pantoprazole 8 mg/hr (10/08/21 0500)    buPROPion, ipratropium-albuterol, LORazepam   Objective    Vitals:   10/08/21 0300 10/08/21 0400 10/08/21 0500 10/08/21 0600  BP: (!) 160/78 (!) 152/74  (!) 152/76  Pulse: 89 85 78 81  Resp: (!) 24 (!) 21    Temp:      TempSrc:      SpO2: 94% 96% 93% 94%  Weight:      Height:         Physical Exam Vitals and nursing note reviewed.  Constitutional:      General: He is not in acute distress.    Appearance: Normal appearance. He is not ill-appearing, toxic-appearing or diaphoretic.  HENT:     Head: Normocephalic and atraumatic.  Nose: Nose normal.     Mouth/Throat:     Mouth: Mucous membranes are moist.     Pharynx: Oropharynx is clear.     Comments: edentulous  Eyes:     General: No scleral icterus.    Extraocular Movements: Extraocular movements intact.  Cardiovascular:     Rate and Rhythm: Normal rate and regular rhythm.     Heart sounds: Murmur heard.    No friction rub. No gallop.  Pulmonary:     Effort: Pulmonary effort is normal. No respiratory distress.     Breath sounds: Normal breath sounds. No wheezing,  rhonchi or rales.  Abdominal:     General: Bowel sounds are normal. There is no distension.     Palpations: Abdomen is soft.     Tenderness: There is no abdominal tenderness. There is no guarding or rebound.  Musculoskeletal:     Cervical back: Neck supple.     Right lower leg: No edema.     Left lower leg: No edema.  Skin:    General: Skin is warm and dry.     Coloration: Skin is not jaundiced or pale.  Neurological:     General: No focal deficit present.     Mental Status: He is alert and oriented to person, place, and time. Mental status is at baseline.  Psychiatric:        Mood and Affect: Mood normal.        Behavior: Behavior normal.        Thought Content: Thought content normal.        Judgment: Judgment normal.     Laboratory Data Recent Labs  Lab 10/06/21 1620 10/06/21 1955 10/07/21 1752 10/08/21 0009 10/08/21 0438  WBC 4.7  --   --   --  6.0  HGB 6.9*   < > 8.7* 9.0* 8.8*  HCT 22.6*   < > 26.8* 27.5* 27.8*  PLT 135*  --   --   --  127*   < > = values in this interval not displayed.   Recent Labs  Lab 10/06/21 1620 10/07/21 1212 10/08/21 0438  NA 137 138 138  K 4.5 4.5 4.3  CL 110 111 108  CO2 19* 22 23  BUN 23 20 19   CREATININE 1.52* 1.47* 1.44*  CALCIUM 8.4* 8.5* 8.9  PROT 7.6  --   --   BILITOT 1.1  --   --   ALKPHOS 66  --   --   ALT 15  --   --   AST 19  --   --   GLUCOSE 88 103* 114*   Recent Labs  Lab 10/06/21 1620 10/07/21 1212 10/08/21 0438  INR 1.7* 1.7* 1.6*      Imaging Studies: DG Chest Port 1 View  Result Date: 10/07/2021 CLINICAL DATA:  Wheezing. EXAM: PORTABLE CHEST 1 VIEW COMPARISON:  Chest radiograph dated 11/11/2019. FINDINGS: Mild cardiomegaly with vascular congestion and mild edema. Pneumonia is not excluded. Clinical correlation is recommended. No focal consolidation, pleural effusion or pneumothorax. Atherosclerotic calcification of the aorta. Median sternotomy wires and CABG vascular clips. No acute osseous  pathology. IMPRESSION: Mild cardiomegaly with vascular congestion and mild edema. Pneumonia is not excluded. Electronically Signed   By: 11/13/2019 M.D.   On: 10/07/2021 21:57    Assessment:   # Symptomatic Anemia- suspected 2/2 UGIB source - pt p/w melena - hgb down from 11.7 to 6.9 on presentation - s/p 2 u prbc with improvement to 8.6 -  VSS through hospitalization - macrocytosis noted on initial labs - h/o avm in stomach 2020   # Suspected UGIB - melena present - BUN/CR 20:1.47 - asa use and coumadin; no nsaids or tobacco -Harbinger Score (10/07/2021):  1 1) No Daily PPI in preceding week (+1); 2) Shock index (HR/SBP)>1 (+1); BUN/Creatinine >30 (+1) [0-home, 1-obs, 2-scope]   # Afib on chronic anticoagulation- coumadin -INR 1.7   # CAD s/p pci and stent placement # h/o gastric avm # Diverticulosis # CKD # DM2 # HF # Mitral valve regurg  Plan:  EGD today. Npo since midnight Morning labs reviewed Continue iv protonix 40 mg iv bid Maintain two sites iv access Monitor H&H: No more than every 8 hours: Transfusion and resuscitation as per primary team Coumadin on hold; hold dvt ppx Supportive care as per primary team If patient becomes hemodynamically unstable recommend stat CTA to localize bleeding source In addition to coumadin and asa, pt on lexapro which can lead to poor platelet function and contribute to GI bleeding   Esophagogastroduodenoscopy with possible biopsy, control of bleeding, polypectomy, and interventions as necessary has been discussed with the patient/patient representative. Informed consent was obtained from the patient/patient representative after explaining the indication, nature, and risks of the procedure including but not limited to death, bleeding, perforation, missed neoplasm/lesions, cardiorespiratory compromise, and reaction to medications. Opportunity for questions was given and appropriate answers were provided. Patient/patient  representative has verbalized understanding is amenable to undergoing the procedure.   Refer to forthcoming EGD procedure note for endoscopic findings and any additional recommendations.    I personally performed the service.  Management of other medical comorbidities as per primary team  Thank you for allowing [place] to participate in this patient's care. Please don't hesitate to call if any questions or concerns arise.   Jaynie Collins, DO [facility]  Portions of the record may have been created with voice recognition software. Occasional wrong-word or 'sound-a-like' substitutions may have occurred due to the inherent limitations of voice recognition software.  Read the chart carefully and recognize, using context, where substitutions may have occurred.

## 2021-10-08 NOTE — Progress Notes (Signed)
Post endoscopy brief note:  EGD completed without findings of bleeding source. Pt tolerated procedure well. Recommend colonoscopy. Clear liquids today. Npo at midnight. My colleague will be performing the procedure pending patient completion of prep. Spoke with patient and his daughter who are amenable to undergoing the procedure Can transition ppi to po See epic for full procedure report  Colonoscopy with possible biopsy, control of bleeding, polypectomy, and interventions as necessary has been discussed with the patient/patient representative. Informed consent was obtained from the patient/patient representative after explaining the indication, nature, and risks of the procedure including but not limited to death, bleeding, perforation, missed neoplasm/lesions, cardiorespiratory compromise, and reaction to medications. Opportunity for questions was given and appropriate answers were provided. Patient/patient representative has verbalized understanding is amenable to undergoing the procedure.   Jaynie Collins, DO [facility]

## 2021-10-08 NOTE — Anesthesia Procedure Notes (Signed)
Procedure Name: General with mask airway Date/Time: 10/08/2021 11:01 AM Performed by: Mohammed Kindle, CRNA Pre-anesthesia Checklist: Patient identified, Emergency Drugs available, Suction available and Patient being monitored Patient Re-evaluated:Patient Re-evaluated prior to induction Oxygen Delivery Method: Simple face mask Induction Type: IV induction Placement Confirmation: positive ETCO2 and CO2 detector Dental Injury: Teeth and Oropharynx as per pre-operative assessment

## 2021-10-08 NOTE — Anesthesia Preprocedure Evaluation (Signed)
Anesthesia Evaluation  Patient identified by MRN, date of birth, ID band Patient awake    Reviewed: Allergy & Precautions, NPO status , Patient's Chart, lab work & pertinent test results  Airway Mallampati: II  TM Distance: >3 FB Neck ROM: full    Dental  (+) Edentulous Upper, Edentulous Lower   Pulmonary neg pulmonary ROS, shortness of breath and with exertion,    Pulmonary exam normal  + decreased breath sounds      Cardiovascular Exercise Tolerance: Poor hypertension, Pt. on medications + CAD, + Cardiac Stents and +CHF  negative cardio ROS Normal cardiovascular exam Rhythm:Regular     Neuro/Psych Anxiety Depression negative neurological ROS  negative psych ROS   GI/Hepatic negative GI ROS, Neg liver ROS, PUD,   Endo/Other  negative endocrine ROSdiabetes, Poorly Controlled, Type 2, Insulin Dependent  Renal/GU negative Renal ROS  negative genitourinary   Musculoskeletal  (+) Arthritis ,   Abdominal (+) + obese,   Peds negative pediatric ROS (+)  Hematology negative hematology ROS (+) Blood dyscrasia, anemia ,   Anesthesia Other Findings Past Medical History: No date: Anemia No date: Anxiety No date: Arthritis No date: Benign hypertension No date: Cardiopulmonary arrest (HCC) No date: CHF (congestive heart failure) (HCC) No date: Coronary atherosclerosis of native coronary artery No date: Depression No date: Diabetes (HCC) No date: Glaucoma No date: Hyperlipidemia No date: Mitral valve disorder No date: Neuralgia No date: PVD (peripheral vascular disease) (HCC)  Past Surgical History: No date: CHOLECYSTECTOMY 11/11/2019: COLONOSCOPY WITH PROPOFOL; N/A     Comment:  Procedure: COLONOSCOPY WITH PROPOFOL;  Surgeon: Toney Reil, MD;  Location: [facility] ENDOSCOPY;  Service:               Gastroenterology;  Laterality: N/A; No date: CORONARY ANGIOPLASTY WITH STENT PLACEMENT No date:  CORONARY ARTERY BYPASS GRAFT 11/11/2019: ESOPHAGOGASTRODUODENOSCOPY (EGD) WITH PROPOFOL; N/A     Comment:  Procedure: ESOPHAGOGASTRODUODENOSCOPY (EGD) WITH               PROPOFOL;  Surgeon: Toney Reil, MD;  Location:               [facility] ENDOSCOPY;  Service: Gastroenterology;  Laterality:               N/A; No date: MITRAL VALVE ANNULOPLASTY 12/05/2018: TEE WITHOUT CARDIOVERSION; N/A     Comment:  Procedure: TRANSESOPHAGEAL ECHOCARDIOGRAM (TEE);                Surgeon: Dalia Heading, MD;  Location: [facility] ORS;                Service: Cardiovascular;  Laterality: N/A;  BMI    Body Mass Index: 33.82 kg/m      Reproductive/Obstetrics negative OB ROS                             Anesthesia Physical Anesthesia Plan  ASA: 4  Anesthesia Plan: General   Post-op Pain Management:    Induction: Intravenous  PONV Risk Score and Plan: Propofol infusion and TIVA  Airway Management Planned: Nasal Cannula  Additional Equipment:   Intra-op Plan:   Post-operative Plan:   Informed Consent: I have reviewed the patients History and Physical, chart, labs and discussed the procedure including the risks, benefits and alternatives for the proposed anesthesia with the patient or authorized representative who has indicated  his/her understanding and acceptance.     Dental Advisory Given  Plan Discussed with: CRNA and Surgeon  Anesthesia Plan Comments:         Anesthesia Quick Evaluation

## 2021-10-08 NOTE — Evaluation (Signed)
Clinical/Bedside Swallow Evaluation Patient Details  Name: Jeffrey Mills MRN: [id number] Date of Birth: Mar 25, 1936  Today's Date: 10/08/2021 Time: SLP Start Time (ACUTE ONLY): 1335 SLP Stop Time (ACUTE ONLY): 1425 SLP Time Calculation (min) (ACUTE ONLY): 50 min  Past Medical History:  Past Medical History:  Diagnosis Date   Anemia    Anxiety    Arthritis    Benign hypertension    Cardiopulmonary arrest (HCC)    CHF (congestive heart failure) (HCC)    Coronary atherosclerosis of native coronary artery    Depression    Diabetes (HCC)    Glaucoma    Hyperlipidemia    Mitral valve disorder    Neuralgia    PVD (peripheral vascular disease) (HCC)    Past Surgical History:  Past Surgical History:  Procedure Laterality Date   CHOLECYSTECTOMY     COLONOSCOPY WITH PROPOFOL N/A 11/11/2019   Procedure: COLONOSCOPY WITH PROPOFOL;  Surgeon: Toney Reil, MD;  Location: [facility] ENDOSCOPY;  Service: Gastroenterology;  Laterality: N/A;   CORONARY ANGIOPLASTY WITH STENT PLACEMENT     CORONARY ARTERY BYPASS GRAFT     ESOPHAGOGASTRODUODENOSCOPY (EGD) WITH PROPOFOL N/A 11/11/2019   Procedure: ESOPHAGOGASTRODUODENOSCOPY (EGD) WITH PROPOFOL;  Surgeon: Toney Reil, MD;  Location: [facility] ENDOSCOPY;  Service: Gastroenterology;  Laterality: N/A;   MITRAL VALVE ANNULOPLASTY     TEE WITHOUT CARDIOVERSION N/A 12/05/2018   Procedure: TRANSESOPHAGEAL ECHOCARDIOGRAM (TEE);  Surgeon: Dalia Heading, MD;  Location: [facility] ORS;  Service: Cardiovascular;  Laterality: N/A;   HPI:  85 year old gentleman referred for evaluation of GI bleed on warfarin.  The patient has a history of coronary artery disease status post CABG x4 with mitral valve repair in 1999, coronary stents, most recently in the proximal LAD 06/2011, hypertension, hyperlipidemia, chronic atrial fibrillation on warfarin, and history of prior GI bleed 10/2019 with history of gastric angioectasias.  The patient presented to [facility] ER due to  symptomatic anemia with hemoglobin drop from 10.5 in April to 7.2.  The patient had complaints of melena, a 2-3 week history of dyspnea with minimal exertion, fatigue, and dizziness with standing. Prior to symptom onset, the patient was in his usual state of health, denying chest pain or shortness of breath, and was doing his normal activities around the house.  GI following: see EGD report and recs.  CXR: Mild cardiomegaly with vascular congestion and mild edema.    Assessment / Plan / Recommendation  Clinical Impression  Pt appears to present w/ adequate oropharyngeal phase swallow w/ No oropharyngeal phase dysphagia noted, No neuromuscular deficits noted. Pt consumed po trials w/ No overt, clinical s/s of aspiration during po trials. Pt appears at reduced risk for aspiration following general aspiration precautions.   During po trials, pt could only be given trials of thin liquids d/t being on a clear liquid diet secondary to GI issues/order. He consumed trials w/ no overt coughing, decline in vocal quality, or change in respiratory presentation during/post trials. O2 sats remained 98%. Oral phase appeared [facility] w/ timely bolus management and control of bolus propulsion for A-P transfer for swallowing. Oral clearing w/ trials. OM Exam appeared [facility] w/ no unilateral weakness noted. Speech Clear. Pt fed self w/ setup support.   Recommend continue current diet as ordered by GI w/ upgrade back to a Regular consistency diet w/ well-Cut meats, moistened foods d/t lacking bottom Dentition; Thin liquids. Recommend general aspiration precautions, Pills WHOLE in Puree for safer, easier swallowing as pt described Larger pills(and many pills at  one time) causing difficulty to swallow. Education given on Pills in Puree; food consistencies and easy to eat options; general aspiration precautions. NSG to reconsult if any new needs arise. NSG updated on teaching/giving Pills in puree; and agreed SLP Visit Diagnosis:  Dysphagia, unspecified (R13.10)    Aspiration Risk   (reduced following general precautions)    Diet Recommendation   upgrade back to a Regular consistency diet (when GI appropriate/order) w/ well-Cut meats, moistened foods d/t lacking bottom Dentition; Thin liquids. Recommend general aspiration precautions.  Medication Administration: Whole meds with puree (for ease of swallowing) OR one at a time w/ water   Other  Recommendations Recommended Consults:  (GI following) Oral Care Recommendations: Oral care BID;Oral care before and after PO;Patient independent with oral care Other Recommendations:  (n/a)    Recommendations for follow up therapy are one component of a multi-disciplinary discharge planning process, led by the attending physician.  Recommendations may be updated based on patient status, additional functional criteria and insurance authorization.  Follow up Recommendations No SLP follow up      Assistance Recommended at Discharge None  Functional Status Assessment Patient has had a recent decline in their functional status and demonstrates the ability to make significant improvements in function in a reasonable and predictable amount of time.  Frequency and Duration  (n/a)   (n/a)       Prognosis Prognosis for Safe Diet Advancement: Good Barriers to Reach Goals:  (none)      Swallow Study   General Date of Onset: 10/06/21 HPI: 85 year old gentleman referred for evaluation of GI bleed on warfarin.  The patient has a history of coronary artery disease status post CABG x4 with mitral valve repair in 1999, coronary stents, most recently in the proximal LAD 06/2011, hypertension, hyperlipidemia, chronic atrial fibrillation on warfarin, and history of prior GI bleed 10/2019 with history of gastric angioectasias.  The patient presented to [facility] ER due to symptomatic anemia with hemoglobin drop from 10.5 in April to 7.2.  The patient had complaints of melena, a 2-3 week history of  dyspnea with minimal exertion, fatigue, and dizziness with standing. Prior to symptom onset, the patient was in his usual state of health, denying chest pain or shortness of breath, and was doing his normal activities around the house.  GI following: see EGD report and recs.  CXR: Mild cardiomegaly with vascular congestion and mild edema. Type of Study: Bedside Swallow Evaluation Previous Swallow Assessment: none Diet Prior to this Study: Thin liquids (clear liquids per GI; Regular diet at home) Temperature Spikes Noted: No (wbc 6.0) Respiratory Status: Room air History of Recent Intubation: No Behavior/Cognition: Alert;Cooperative;Pleasant mood Oral Cavity Assessment: Within Functional Limits Oral Care Completed by SLP: Recent completion by staff Oral Cavity - Dentition: Dentures, top (does not wear bottom dentures) Vision: Functional for self-feeding Self-Feeding Abilities: Able to feed self Patient Positioning: Upright in bed (needed min more positioning w/ pillows low behind back) Baseline Vocal Quality: Normal Volitional Cough: Strong Volitional Swallow: Able to elicit    Oral/Motor/Sensory Function Overall Oral Motor/Sensory Function: Within functional limits   Ice Chips Ice chips: Not tested   Thin Liquid Thin Liquid: Within functional limits Presentation: Cup;Self Fed;Straw (5 trials via each)    Nectar Thick Nectar Thick Liquid: Not tested   Honey Thick Honey Thick Liquid: Not tested   Puree Puree: Not tested   Solid     Solid: Not tested         Jerilynn Som, MS,  [facility] [phone] Kamariyah Timberlake 10/08/2021,2:45 PM

## 2021-10-08 NOTE — Progress Notes (Signed)
Speaking with patient regarding power of attorney and living will. He stated that he has paperwork at home so unable to get copies tonight. He has expressed that in the need of decision making he would like his daughter to be in charge though his son is older. She helps him on a day to day basis and he has had conversations with her regarding his wishes. He plans to speak with her about retrieving paperwork for the hospital to have on file.

## 2021-10-08 NOTE — Op Note (Signed)
[facility] Patient Name: Jeffrey Mills Procedure Date: 10/08/2021 10:44 AM MRN: [id number] Account #: [id number] Date of Birth: [birth year] Admit Type: Inpatient Age: 85 Room: [facility] ENDO ROOM 1 Gender: Male Note Status: Finalized Instrument Name: Upper Endoscope [id number] Procedure:             Upper GI endoscopy Indications:           Melena Providers:             Rueben Bash, DO Referring MD:          Rusty Aus, MD (Referring MD) Medicines:             Monitored Anesthesia Care Complications:         No immediate complications. Estimated blood loss:                         Minimal. Procedure:             Pre-Anesthesia Assessment:                        - Prior to the procedure, a History and Physical was                         performed, and patient medications and allergies were                         reviewed. The patient is competent. The risks and                         benefits of the procedure and the sedation options and                         risks were discussed with the patient. All questions                         were answered and informed consent was obtained.                         Patient identification and proposed procedure were                         verified by the physician, the nurse, the anesthetist                         and the technician in the endoscopy suite. Mental                         Status Examination: alert and oriented. Airway                         Examination: normal oropharyngeal airway and neck                         mobility. Respiratory Examination: clear to                         auscultation. CV Examination: systolic murmur.  Prophylactic Antibiotics: The patient does not require                         prophylactic antibiotics. Prior Anticoagulants: The                         patient has taken Coumadin (warfarin), last dose was 3                          days prior to procedure. ASA Grade Assessment: IV - A                         patient with severe systemic disease that is a                         constant threat to life. After reviewing the risks and                         benefits, the patient was deemed in satisfactory                         condition to undergo the procedure. The anesthesia                         plan was to use monitored anesthesia care (MAC).                         Immediately prior to administration of medications,                         the patient was re-assessed for adequacy to receive                         sedatives. The heart rate, respiratory rate, oxygen                         saturations, blood pressure, adequacy of pulmonary                         ventilation, and response to care were monitored                         throughout the procedure. The physical status of the                         patient was re-assessed after the procedure.                        After obtaining informed consent, the endoscope was                         passed under direct vision. Throughout the procedure,                         the patient's blood pressure, pulse, and oxygen                         saturations were monitored continuously. The  Endoscope                         was introduced through the mouth, and advanced to the                         second part of duodenum. The upper GI endoscopy was                         accomplished without difficulty. The patient tolerated                         the procedure well. Findings:      The duodenal bulb, first portion of the duodenum and second portion of       the duodenum were normal. Estimated blood loss: none.      Scattered mild inflammation characterized by erosions and erythema was       found in the gastric antrum. Biopsies were taken with a cold forceps for       Helicobacter pylori testing. Estimated blood loss was minimal.      A small hiatal  hernia was present.      The exam of the stomach was otherwise normal.      No gross lesions were noted in the entire examined stomach. no avms       appreciated.      The Z-line was regular and was found 40 cm from the incisors.      Esophagogastric landmarks were identified: the gastroesophageal junction       was found at 40 cm from the incisors.      The exam of the esophagus was otherwise normal. Impression:            - Normal duodenal bulb, first portion of the duodenum                         and second portion of the duodenum.                        - Gastritis. Biopsied.                        - Small hiatal hernia.                        - No gross lesions in the stomach.                        - Z-line regular, 40 cm from the incisors.                        - Esophagogastric landmarks identified. Recommendation:        - Discharge patient to home.                        - Clear liquid diet.                        - Continue present medications.                        - Await pathology results.                        -  Will recommend colonoscopy                        - The findings and recommendations were discussed with                         the patient's family. Procedure Code(s):     --- Professional ---                        [phone], Esophagogastroduodenoscopy, flexible,                         transoral; with biopsy, single or multiple Diagnosis Code(s):     --- Professional ---                        K29.70, Gastritis, unspecified, without bleeding                        K44.9, Diaphragmatic hernia without obstruction or                         gangrene                        K92.1, Melena (includes Hematochezia) CPT copyright 2019 American Medical Association. All rights reserved. The codes documented in this report are preliminary and upon coder review may  be revised to meet current compliance requirements. Attending Participation:      I personally performed  the entire procedure. Volney American, DO Annamaria Helling DO, DO 10/08/2021 11:14:03 AM This report has been signed electronically. Number of Addenda: 0 Note Initiated On: 10/08/2021 10:44 AM Estimated Blood Loss:  Estimated blood loss was minimal.      [facility]

## 2021-10-08 NOTE — Interval H&P Note (Signed)
History and Physical Interval Note: Preprocedure H&P from 10/08/21  was reviewed and there was no interval change after seeing and examining the patient.  Written consent was obtained from the patient after discussion of risks, benefits, and alternatives. Patient has consented to proceed with Esophagogastroduodenoscopy with possible intervention   10/08/2021 10:57 AM  Jeffrey Mills  has presented today for surgery, with the diagnosis of gi bleed.  The various methods of treatment have been discussed with the patient and family. After consideration of risks, benefits and other options for treatment, the patient has consented to  Procedure(s): ESOPHAGOGASTRODUODENOSCOPY (EGD) WITH PROPOFOL (N/A) as a surgical intervention.  The patient's history has been reviewed, patient examined, no change in status, stable for surgery.  I have reviewed the patient's chart and labs.  Questions were answered to the patient's satisfaction.     Jaynie Collins

## 2021-10-08 NOTE — Progress Notes (Signed)
Assumed care at 1500 today. Patient in good spirits, moves well in bed. Started bowel prep for colonoscopy tomorrow. Tolerating clear liquid diet well.

## 2021-10-08 NOTE — Progress Notes (Signed)
PROGRESS NOTE  Jeffrey Mills    DOB: [birth year], 85 y.o.  OFB:[id number]  PCP: Danella Penton, MD   Code Status: Full Code   DOA: 10/06/2021   LOS: 2  Brief Narrative of Current Hospitalization  Jeffrey Mills is a 85 y.o. male with a PMH significant for A. fib on Coumadin, iron deficiency anemia due to chronic blood loss, HLD, type II DM, CAD s/p PCI with stenting, CKDIII, severe mitral regurgitation, prior MI, mitral valve annuloplasty. They presented from home after abnormal labs from PCP office to the ED on 10/06/2021 with acute decline in hemoglobin from 11-7.2 in the setting of melena, DOE, positive orthostatics and fatigue.  Last EGD and colonoscopy 10/2019.  In the ED, it was found that they had hemoglobin 6.9.  Transfused with 2 units PRBCs as well as started on IV Protonix.  GI was consulted. Patient was admitted to medicine service for further workup and management of GI bleed as outlined in detail below.  10/08/21 -stable  Assessment & Plan  Active Problems:   Symptomatic anemia   Type 2 diabetes mellitus without complication, with long-term current use of insulin (HCC)   Warfarin anticoagulation   Primary hypertension   GI bleed  Acute GI bleed-hemoglobin remaining stable at 8.3> 8.6>9.0>8.8 s/p 2 units PRBCs 11/9.  Vital signs remained stable.  Transfusion threshold is 8. -GI following, appreciate recommendations  - EGD 11/11  - colonoscopy 11/12 -PO protonix -CBC a.m.  A. Fib  mitral valve annuloplasty  CAD  HLD  HTN- home medications include Coumadin.  INR 1.7 on admission.  HAS-BLED score 4. Blood pressures elevated since admission - cardiology following, appreciate recommendations -Repeat INR -Continue holding Coumadin, ASA - continue home simvastatin - continue home metoprolol -Holding home Lasix -Continuous cardiac monitoring  AKI on CKD IIIa- baseline around 1.2. Admission Cr 1.52 likely from hypovolemia.  receiving fluid rehydration.  - repeat BMP  am  Type II DM- A1c 6.9 on admission - sSSI when taking PO  Iron deficiency anemia- not taking iron supplement for several weeks so unlikely to contribute to his finding of melena. -CBC a.m.  Anxiety/depression-chronic, stable -Continue home bupropion, Lexapro, as needed lorazepam - consider weaning off lexapro as can be associated with poor platelet function  DVT prophylaxis: SCDs Start: 10/06/21 1807   Diet:  Diet Orders (From admission, onward)     Start     Ordered   10/07/21 1656  Diet NPO time specified Except for: Ice Chips, Sips with Meds  Diet effective now       Question Answer Comment  Except for Ice Chips   Except for Sips with Meds      10/07/21 1656            Subjective 10/08/21    Pt reports feeling well overall. No more bleeding and no BM since admission. Very hungry.  Disposition Plan & Communication  Patient status: Inpatient  Admitted From: Home Disposition: Home Anticipated discharge date: 11/12  Family Communication: None Consults, Procedures, Significant Events  Consultants:  Cardiology GI  Procedures/significant events:  None at this time Antimicrobials:  Anti-infectives (From admission, onward)    None       Objective   Vitals:   10/08/21 0300 10/08/21 0400 10/08/21 0500 10/08/21 0600  BP: (!) 160/78 (!) 152/74  (!) 152/76  Pulse: 89 85 78 81  Resp: (!) 24 (!) 21    Temp:      TempSrc:  SpO2: 94% 96% 93% 94%  Weight:      Height:        Intake/Output Summary (Last 24 hours) at 10/08/2021 0811 Last data filed at 10/08/2021 0726 Gross per 24 hour  Intake 763.56 ml  Output 5275 ml  Net -4511.44 ml    Filed Weights   10/06/21 1618  Weight: 106.9 kg    Patient BMI: Body mass index is 33.82 kg/m.   Physical Exam: General: awake, alert, NAD HEENT: atraumatic, clear conjunctiva, anicteric sclera, moist mucus membranes, hearing grossly normal Respiratory: normal respiratory effort. Cardiovascular: normal  S1/S2,  RRR, no JVD, murmurs, rubs, gallops, quick capillary refill  Gastrointestinal: soft, NT, ND, no HSM felt Nervous: A&O x3. no gross focal neurologic deficits, normal speech Extremities: moves all equall, no edema, normal tone Skin: dry, intact, normal temperature, normal color, No rashes, lesions or ulcers Psychiatry: normal mood, congruent affect  Labs   I have personally reviewed following labs and imaging studies Admission on 10/06/2021  Component Date Value Ref Range Status   Sodium 10/06/2021 137  135 - 145 mmol/L Final   Potassium 10/06/2021 4.5  3.5 - 5.1 mmol/L Final   Chloride 10/06/2021 110  98 - 111 mmol/L Final   CO2 10/06/2021 19 (A)  22 - 32 mmol/L Final   Glucose, Bld 10/06/2021 88  70 - 99 mg/dL Final   BUN 37/08/6268 23  8 - 23 mg/dL Final   Creatinine, Ser 10/06/2021 1.52 (A)  0.61 - 1.24 mg/dL Final   Calcium 48/54/6270 8.4 (A)  8.9 - 10.3 mg/dL Final   Total Protein 35/00/9381 7.6  6.5 - 8.1 g/dL Final   Albumin 82/99/3716 3.7  3.5 - 5.0 g/dL Final   AST 96/78/9381 19  15 - 41 U/L Final   ALT 10/06/2021 15  0 - 44 U/L Final   Alkaline Phosphatase 10/06/2021 66  38 - 126 U/L Final   Total Bilirubin 10/06/2021 1.1  0.3 - 1.2 mg/dL Final   GFR, Estimated 10/06/2021 45 (A)  >60 mL/min Final   Anion gap 10/06/2021 8  5 - 15 Final   WBC 10/06/2021 4.7  4.0 - 10.5 K/uL Final   RBC 10/06/2021 2.19 (A)  4.22 - 5.81 MIL/uL Final   Hemoglobin 10/06/2021 6.9 (A)  13.0 - 17.0 g/dL Final   HCT 01/75/1025 22.6 (A)  39.0 - 52.0 % Final   MCV 10/06/2021 103.2 (A)  80.0 - 100.0 fL Final   MCH 10/06/2021 31.5  26.0 - 34.0 pg Final   MCHC 10/06/2021 30.5  30.0 - 36.0 g/dL Final   RDW 85/27/7824 17.1 (A)  11.5 - 15.5 % Final   Platelets 10/06/2021 135 (A)  150 - 400 K/uL Final   nRBC 10/06/2021 0.0  0.0 - 0.2 % Final   ABO/RH(D) 10/06/2021 A NEG   Final   Antibody Screen 10/06/2021 POS   Final   Sample Expiration 10/06/2021 10/09/2021,2359   Final   Antibody  Identification 10/06/2021    Final                   Value:ANTI D ANTI C Performed at [facility], [address], [place], [place] [address]    Unit Number 10/06/2021 [id number]   Final   Blood Component Type 10/06/2021 RED CELLS,LR   Final   Unit division 10/06/2021 00   Final   Status of Unit 10/06/2021 ISSUED,FINAL   Final   Transfusion Status 10/06/2021 OK TO TRANSFUSE   Final  Crossmatch Result 10/06/2021 COMPATIBLE   Final   Unit Number 10/06/2021 [id number]   Final   Blood Component Type 10/06/2021 RED CELLS,LR   Final   Unit division 10/06/2021 00   Final   Status of Unit 10/06/2021 ISSUED,FINAL   Final   Transfusion Status 10/06/2021 OK TO TRANSFUSE   Final   Crossmatch Result 10/06/2021 COMPATIBLE   Final   Unit Number 10/06/2021 [id number]   Final   Blood Component Type 10/06/2021 RBC LR PHER1   Final   Unit division 10/06/2021 00   Final   Status of Unit 10/06/2021 ALLOCATED   Final   Transfusion Status 10/06/2021 OK TO TRANSFUSE   Final   Crossmatch Result 10/06/2021 COMPATIBLE   Final   Unit Number 10/06/2021 [id number]   Final   Blood Component Type 10/06/2021 RED CELLS,LR   Final   Unit division 10/06/2021 00   Final   Status of Unit 10/06/2021 ALLOCATED   Final   Transfusion Status 10/06/2021 OK TO TRANSFUSE   Final   Crossmatch Result 10/06/2021 COMPATIBLE   Final   Prothrombin Time 10/06/2021 19.5 (A)  11.4 - 15.2 seconds Final   INR 10/06/2021 1.7 (A)  0.8 - 1.2 Final   Order Confirmation 10/06/2021    Final                   Value:ORDER PROCESSED BY BLOOD BANK Performed at [facility], [address] [place], [place] [address]    SARS Coronavirus 2 by RT PCR 10/06/2021 NEGATIVE  NEGATIVE Final   Influenza A by PCR 10/06/2021 NEGATIVE  NEGATIVE Final   Influenza B by PCR 10/06/2021 NEGATIVE  NEGATIVE Final   Hemoglobin 10/06/2021 6.9 (A)  13.0 - 17.0 g/dL Final   HCT 75/08/2584 21.9 (A)  39.0 - 52.0 % Final   Hemoglobin  10/07/2021 8.3 (A)  13.0 - 17.0 g/dL Final   HCT 27/78/2423 25.7 (A)  39.0 - 52.0 % Final   Hemoglobin 10/07/2021 8.6 (A)  13.0 - 17.0 g/dL Final   HCT 53/61/4431 27.2 (A)  39.0 - 52.0 % Final   Glucose-Capillary 10/06/2021 76  70 - 99 mg/dL Final   ISSUE DATE / TIME 10/06/2021 [id number]   Final   Blood Product Unit Number 10/06/2021 [id number]   Final   PRODUCT CODE 10/06/2021 E0382V00   Final   Unit Type and Rh 10/06/2021 0600   Final   Blood Product Expiration Date 10/06/2021 [id number]   Final   ISSUE DATE / TIME 10/06/2021 [id number]   Final   Blood Product Unit Number 10/06/2021 [id number]   Final   PRODUCT CODE 10/06/2021 E0382V00   Final   Unit Type and Rh 10/06/2021 0600   Final   Blood Product Expiration Date 10/06/2021 [id number]   Final   Blood Product Unit Number 10/06/2021 [id number]   Final   PRODUCT CODE 10/06/2021 E4532V00   Final   Unit Type and Rh 10/06/2021 0600   Final   Blood Product Expiration Date 10/06/2021 [id number]   Final   Blood Product Unit Number 10/06/2021 [id number]   Final   PRODUCT CODE 10/06/2021 E0382V00   Final   Unit Type and Rh 10/06/2021 0600   Final   Blood Product Expiration Date 10/06/2021 [id number]   Final   Hemoglobin 10/07/2021 7.9 (A)  13.0 - 17.0 g/dL Final   HCT 94/70/9628 24.7 (A)  39.0 - 52.0 % Final   Hemoglobin 10/07/2021 8.7 (A)  13.0 - 17.0 g/dL Final  HCT 10/07/2021 26.8 (A)  39.0 - 52.0 % Final   MRSA by PCR Next Gen 10/07/2021 NOT DETECTED  NOT DETECTED Final   Glucose-Capillary 10/07/2021 110 (A)  70 - 99 mg/dL Final   Comment 1 96/22/2979 Notify RN   Final   Comment 2 10/07/2021 Document in Chart   Final   Glucose-Capillary 10/07/2021 81  70 - 99 mg/dL Final   Comment 1 89/21/1941 Notify RN   Final   Comment 2 10/07/2021 Document in Chart   Final   Glucose-Capillary 10/07/2021 56 (A)  70 - 99 mg/dL Final   Glucose-Capillary 10/07/2021 98  70 - 99 mg/dL Final   Prothrombin Time 10/07/2021 20.4  (A)  11.4 - 15.2 seconds Final   INR 10/07/2021 1.7 (A)  0.8 - 1.2 Final   Glucose-Capillary 10/07/2021 65 (A)  70 - 99 mg/dL Final   Sodium 74/06/1447 138  135 - 145 mmol/L Final   Potassium 10/07/2021 4.5  3.5 - 5.1 mmol/L Final   Chloride 10/07/2021 111  98 - 111 mmol/L Final   CO2 10/07/2021 22  22 - 32 mmol/L Final   Glucose, Bld 10/07/2021 103 (A)  70 - 99 mg/dL Final   BUN 18/56/3149 20  8 - 23 mg/dL Final   Creatinine, Ser 10/07/2021 1.47 (A)  0.61 - 1.24 mg/dL Final   Calcium 70/26/3785 8.5 (A)  8.9 - 10.3 mg/dL Final   GFR, Estimated 10/07/2021 47 (A)  >60 mL/min Final   Anion gap 10/07/2021 5  5 - 15 Final   Hgb A1c MFr Bld 10/07/2021 6.9 (A)  4.8 - 5.6 % Final   Mean Plasma Glucose 10/07/2021 151.33  mg/dL Final   Hemoglobin 88/50/2774 9.0 (A)  13.0 - 17.0 g/dL Final   HCT 12/87/8676 27.5 (A)  39.0 - 52.0 % Final   Glucose-Capillary 10/07/2021 102 (A)  70 - 99 mg/dL Final   Glucose-Capillary 10/07/2021 65 (A)  70 - 99 mg/dL Final   Sodium 72/07/4708 138  135 - 145 mmol/L Final   Potassium 10/08/2021 4.3  3.5 - 5.1 mmol/L Final   Chloride 10/08/2021 108  98 - 111 mmol/L Final   CO2 10/08/2021 23  22 - 32 mmol/L Final   Glucose, Bld 10/08/2021 114 (A)  70 - 99 mg/dL Final   BUN 62/83/6629 19  8 - 23 mg/dL Final   Creatinine, Ser 10/08/2021 1.44 (A)  0.61 - 1.24 mg/dL Final   Calcium 47/65/4650 8.9  8.9 - 10.3 mg/dL Final   GFR, Estimated 10/08/2021 48 (A)  >60 mL/min Final   Anion gap 10/08/2021 7  5 - 15 Final   WBC 10/08/2021 6.0  4.0 - 10.5 K/uL Final   RBC 10/08/2021 2.87 (A)  4.22 - 5.81 MIL/uL Final   Hemoglobin 10/08/2021 8.8 (A)  13.0 - 17.0 g/dL Final   HCT 35/46/5681 27.8 (A)  39.0 - 52.0 % Final   MCV 10/08/2021 96.9  80.0 - 100.0 fL Final   MCH 10/08/2021 30.7  26.0 - 34.0 pg Final   MCHC 10/08/2021 31.7  30.0 - 36.0 g/dL Final   RDW 27/51/7001 18.2 (A)  11.5 - 15.5 % Final   Platelets 10/08/2021 127 (A)  150 - 400 K/uL Final   nRBC 10/08/2021 0.0  0.0 -  0.2 % Final   Prothrombin Time 10/08/2021 19.3 (A)  11.4 - 15.2 seconds Final   INR 10/08/2021 1.6 (A)  0.8 - 1.2 Final   Glucose-Capillary 10/07/2021 78  70 - 99  mg/dL Final   Hemoglobin 86/57/8469 9.0 (A)  13.0 - 17.0 g/dL Final   HCT 62/95/2841 27.7 (A)  39.0 - 52.0 % Final   Glucose-Capillary 10/07/2021 73  70 - 99 mg/dL Final   Procalcitonin 32/44/0102 <0.10  ng/mL Final   Glucose-Capillary 10/07/2021 88  70 - 99 mg/dL Final   Glucose-Capillary 10/08/2021 115 (A)  70 - 99 mg/dL Final   Glucose-Capillary 10/08/2021 110 (A)  70 - 99 mg/dL Final    Imaging Studies  DG Chest Port 1 View  Result Date: 10/07/2021 CLINICAL DATA:  Wheezing. EXAM: PORTABLE CHEST 1 VIEW COMPARISON:  Chest radiograph dated 11/11/2019. FINDINGS: Mild cardiomegaly with vascular congestion and mild edema. Pneumonia is not excluded. Clinical correlation is recommended. No focal consolidation, pleural effusion or pneumothorax. Atherosclerotic calcification of the aorta. Median sternotomy wires and CABG vascular clips. No acute osseous pathology. IMPRESSION: Mild cardiomegaly with vascular congestion and mild edema. Pneumonia is not excluded. Electronically Signed   By: Elgie Collard M.D.   On: 10/07/2021 21:57   Medications   Scheduled Meds:  brimonidine  1 drop Both Eyes BID   Chlorhexidine Gluconate Cloth  6 each Topical Q0600   escitalopram  10 mg Oral Daily   latanoprost  1 drop Both Eyes QHS   metoprolol tartrate  12.5 mg Oral BID   [START ON 10/10/2021] pantoprazole  40 mg Intravenous Q12H   simvastatin  40 mg Oral Daily   No recently discontinued medications to reconcile  LOS: 2 days   Time spent: >58min  Leeroy Bock, DO [facility] 10/08/2021, 8:11 AM   Please refer to amion to contact the [facility] Attending or Consulting provider for this pt  [email or web address] Available by Epic secure chat 7AM-7PM. If 7PM-7AM, please contact night-coverage

## 2021-10-08 NOTE — Anesthesia Postprocedure Evaluation (Signed)
Anesthesia Post Note  Patient: Jeffrey Mills  Procedure(s) Performed: ESOPHAGOGASTRODUODENOSCOPY (EGD) WITH PROPOFOL  Patient location during evaluation: PACU Anesthesia Type: General Level of consciousness: awake and alert Pain management: satisfactory to patient Vital Signs Assessment: post-procedure vital signs reviewed and stable Respiratory status: spontaneous breathing and nonlabored ventilation Cardiovascular status: blood pressure returned to baseline Anesthetic complications: no   No notable events documented.   Last Vitals:  Vitals:   10/08/21 1132 10/08/21 1200  BP: 106/73 133/68  Pulse: (!) 102 82  Resp: 14 16  Temp:    SpO2:  96%    Last Pain:  Vitals:   10/08/21 1142  TempSrc:   PainSc: 0-No pain                 VAN STAVEREN,Taelyr Jantz

## 2021-10-08 NOTE — Progress Notes (Signed)
SLP Cancellation Note  Patient Details Name: Jeffrey Mills MRN: [id number] DOB: Dec 30, 1935   Cancelled treatment:       Reason Eval/Treat Not Completed: Patient at procedure or test/unavailable (chart reviewed.  will f/u.)     Jerilynn Som, MS, CCC-SLP Speech Language Pathologist Rehab Services [phone] [facility] 10/08/2021, 11:05 AM

## 2021-10-09 ENCOUNTER — Encounter: Payer: Self-pay | Admitting: Internal Medicine

## 2021-10-09 ENCOUNTER — Encounter
Admission: EM | Disposition: A | Discharge status: Home / Self Care | Payer: Self-pay | Source: Home / Self Care | Attending: Student in an Organized Health Care Education/Training Program

## 2021-10-09 ENCOUNTER — Inpatient Hospital Stay: Payer: Medicare Other | Admitting: Anesthesiology

## 2021-10-09 DIAGNOSIS — D649 Anemia, unspecified: Secondary | ICD-10-CM | POA: Diagnosis not present

## 2021-10-09 DIAGNOSIS — K922 Gastrointestinal hemorrhage, unspecified: Secondary | ICD-10-CM | POA: Diagnosis not present

## 2021-10-09 DIAGNOSIS — I10 Essential (primary) hypertension: Secondary | ICD-10-CM | POA: Diagnosis not present

## 2021-10-09 DIAGNOSIS — E119 Type 2 diabetes mellitus without complications: Secondary | ICD-10-CM | POA: Diagnosis not present

## 2021-10-09 HISTORY — PX: COLONOSCOPY WITH PROPOFOL: SHX5780

## 2021-10-09 LAB — BASIC METABOLIC PANEL
Anion gap: 9 (ref 5–15)
BUN: 17 mg/dL (ref 8–23)
CO2: 24 mmol/L (ref 22–32)
Calcium: 9 mg/dL (ref 8.9–10.3)
Chloride: 104 mmol/L (ref 98–111)
Creatinine, Ser: 1.51 mg/dL — ABNORMAL HIGH (ref 0.61–1.24)
GFR, Estimated: 46 mL/min — ABNORMAL LOW (ref >60)
Glucose, Bld: 140 mg/dL — ABNORMAL HIGH (ref 70–99)
Potassium: 4.5 mmol/L (ref 3.5–5.1)
Sodium: 137 mmol/L (ref 135–145)

## 2021-10-09 LAB — CBC
HCT: 28.3 % — ABNORMAL LOW (ref 39.0–52.0)
Hemoglobin: 9.2 g/dL — ABNORMAL LOW (ref 13.0–17.0)
MCH: 32.1 pg (ref 26.0–34.0)
MCHC: 32.5 g/dL (ref 30.0–36.0)
MCV: 98.6 fL (ref 80.0–100.0)
Platelets: 140 10*3/uL — ABNORMAL LOW (ref 150–400)
RBC: 2.87 MIL/uL — ABNORMAL LOW (ref 4.22–5.81)
RDW: 17.5 % — ABNORMAL HIGH (ref 11.5–15.5)
WBC: 5.3 10*3/uL (ref 4.0–10.5)
nRBC: 0 % (ref 0.0–0.2)

## 2021-10-09 LAB — GLUCOSE, CAPILLARY
Glucose-Capillary: 132 mg/dL — ABNORMAL HIGH (ref 70–99)
Glucose-Capillary: 121 mg/dL — ABNORMAL HIGH (ref 70–99)
Glucose-Capillary: 159 mg/dL — ABNORMAL HIGH (ref 70–99)
Glucose-Capillary: 159 mg/dL — ABNORMAL HIGH (ref 70–99)
Glucose-Capillary: 251 mg/dL — ABNORMAL HIGH (ref 70–99)

## 2021-10-09 LAB — PROTIME-INR
INR: 1.6 — ABNORMAL HIGH (ref 0.8–1.2)
Prothrombin Time: 18.9 seconds — ABNORMAL HIGH (ref 11.4–15.2)

## 2021-10-09 SURGERY — COLONOSCOPY WITH PROPOFOL
Anesthesia: Monitor Anesthesia Care

## 2021-10-09 MED ORDER — PHENYLEPHRINE HCL (PRESSORS) 10 MG/ML IV SOLN
INTRAVENOUS | Status: DC | PRN
  Administered 2021-10-09 (×2): 80 ug via INTRAVENOUS

## 2021-10-09 MED ORDER — LIDOCAINE HCL (PF) 2 % IJ SOLN
INTRAMUSCULAR | Status: AC
  Filled 2021-10-09: qty 5

## 2021-10-09 MED ORDER — METOPROLOL SUCCINATE ER 25 MG PO TB24
25.0000 mg | ORAL_TABLET | Freq: Two times a day (BID) | ORAL | Status: DC
  Administered 2021-10-09 – 2021-10-11 (×5): 25 mg via ORAL
  Filled 2021-10-09 (×5): qty 1

## 2021-10-09 MED ORDER — PROPOFOL 500 MG/50ML IV EMUL
INTRAVENOUS | Status: DC | PRN
  Administered 2021-10-09 (×2): 20 mg via INTRAVENOUS
  Administered 2021-10-09: 200 ug/kg/min via INTRAVENOUS

## 2021-10-09 MED ORDER — PHENYLEPHRINE HCL (PRESSORS) 10 MG/ML IV SOLN: INTRAVENOUS | Status: DC | PRN

## 2021-10-09 MED ORDER — PROPOFOL 500 MG/50ML IV EMUL
INTRAVENOUS | Status: AC
  Filled 2021-10-09: qty 50

## 2021-10-09 MED ORDER — LIDOCAINE HCL (CARDIAC) PF 100 MG/5ML IV SOSY
PREFILLED_SYRINGE | INTRAVENOUS | Status: DC | PRN
  Administered 2021-10-09: 60 mg via INTRAVENOUS

## 2021-10-09 MED ORDER — ONDANSETRON HCL 4 MG/2ML IJ SOLN: 4.0000 mg | Freq: Once | INTRAMUSCULAR | Status: DC | PRN

## 2021-10-09 NOTE — Plan of Care (Signed)
  Problem: Clinical Measurements: Goal: Ability to maintain clinical measurements within normal limits will improve Outcome: Progressing   Problem: Clinical Measurements: Goal: Will remain free from infection Outcome: Progressing   Problem: Clinical Measurements: Goal: Diagnostic test results will improve Outcome: Progressing   

## 2021-10-09 NOTE — Anesthesia Preprocedure Evaluation (Addendum)
Anesthesia Evaluation  Patient identified by MRN, date of birth, ID band Patient awake    Reviewed: Allergy & Precautions, NPO status , Patient's Chart, lab work & pertinent test results  Airway Mallampati: II  TM Distance: >3 FB Neck ROM: Full    Dental  (+) Edentulous Lower, Upper Dentures   Pulmonary sleep apnea ,           Cardiovascular hypertension, + CAD, + Past MI, + Cardiac Stents, + CABG, + Peripheral Vascular Disease and +CHF  + dysrhythmias Atrial Fibrillation + Valvular Problems/Murmurs   Hyperlipidemia mitral valve annuloplasty Hx cardiopulmonary arrest  11/08/19 ECHO: 1. Left ventricular ejection fraction, by visual estimation, is 60 to 65%. The left ventricle has normal function. Left ventricular septal wall thickness was mildly increased. Mildly increased left ventricular posterior wall thickness. There is mildly increased left ventricular hypertrophy. 2. The left ventricle has no regional wall motion abnormalities. 3. Global right ventricle has normal systolic function.The right ventricular size is mildly enlarged. No increase in right ventricular wall thickness. 4. Left atrial size was mildly dilated. 5. Right atrial size was normal. 6. The mitral valve was not well visualized. Moderate mitral valve regurgitation. 7. The tricuspid valve is not well visualized. Tricuspid valve regurgitation is trivial. 8. The aortic valve was not well visualized. Aortic valve regurgitation is trivial. Mild to moderate aortic valve sclerosis/calcification without any evidence of aortic stenosis. 9. The pulmonic valve was not well visualized. Pulmonic valve regurgitation is trivial. 10. The aortic root was not well visualized. 11. Moderately elevated pulmonary artery systolic pressure. 12. The atrial septum is grossly normal.   Neuro/Psych PSYCHIATRIC DISORDERS Anxiety Depression    GI/Hepatic GI bleed   Endo/Other   diabetes, Insulin Dependentobesity  Renal/GU Renal InsufficiencyRenal disease     Musculoskeletal  (+) Arthritis ,   Abdominal   Peds  Hematology  (+) anemia , anticoagulation   Anesthesia Other Findings   Reproductive/Obstetrics                            Anesthesia Physical Anesthesia Plan  ASA: 4  Anesthesia Plan: MAC   Post-op Pain Management:    Induction: Intravenous  PONV Risk Score and Plan:   Airway Management Planned: Mask and Nasal Cannula  Additional Equipment:   Intra-op Plan:   Post-operative Plan:   Informed Consent: I have reviewed the patients History and Physical, chart, labs and discussed the procedure including the risks, benefits and alternatives for the proposed anesthesia with the patient or authorized representative who has indicated his/her understanding and acceptance.       Plan Discussed with:   Anesthesia Plan Comments: (+OSA)      Anesthesia Quick Evaluation

## 2021-10-09 NOTE — Care Plan (Signed)
Colonoscopy unremarkable. Will plan for VCE tomorrow morning. Results will not be back till Monday at earliest. Clear liquids now, NPO at midnight.  Merlyn Lot MD, MPH

## 2021-10-09 NOTE — Anesthesia Postprocedure Evaluation (Signed)
Anesthesia Post Note  Patient: Jeffrey Mills  Procedure(s) Performed: COLONOSCOPY WITH PROPOFOL  Anesthesia Type: MAC Anesthetic complications: no   No notable events documented.   Last Vitals:  Vitals:   10/09/21 0930 10/09/21 0939  BP: (!) 117/54 (!) 120/50  Pulse: 79 71  Resp: 14 13  Temp: 36.6 C   SpO2: 97% 95%    Last Pain:  Vitals:   10/09/21 0939  TempSrc:   PainSc: 0-No pain                 Deno Etienne

## 2021-10-09 NOTE — Op Note (Signed)
[facility] Gastroenterology Patient Name: Jeffrey Mills Procedure Date: 10/09/2021 7:04 AM MRN: [id number] Account #: [id number] Date of Birth: 1936/04/28 Admit Type: Inpatient Age: 85 Room: [facility] ENDO ROOM 4 Gender: Male Note Status: Finalized Instrument Name: Park Meo [id number] Procedure:             Colonoscopy Indications:           Gastrointestinal bleeding Providers:             Andrey Farmer MD, MD Referring MD:          Rusty Aus, MD (Referring MD) Medicines:             Monitored Anesthesia Care Complications:         No immediate complications. Procedure:             Pre-Anesthesia Assessment:                        - Prior to the procedure, a History and Physical was                         performed, and patient medications and allergies were                         reviewed. The patient is competent. The risks and                         benefits of the procedure and the sedation options and                         risks were discussed with the patient. All questions                         were answered and informed consent was obtained.                         Patient identification and proposed procedure were                         verified by the physician, the nurse, the anesthetist                         and the technician in the endoscopy suite. Mental                         Status Examination: alert and oriented. Airway                         Examination: normal oropharyngeal airway and neck                         mobility. Respiratory Examination: clear to                         auscultation. CV Examination: normal. Prophylactic                         Antibiotics: The patient does not require prophylactic  antibiotics. Prior Anticoagulants: The patient has                         taken Coumadin (warfarin), last dose was 4 days prior                         to procedure. ASA Grade Assessment: III - A  patient                         with severe systemic disease. After reviewing the                         risks and benefits, the patient was deemed in                         satisfactory condition to undergo the procedure. The                         anesthesia plan was to use monitored anesthesia care                         (MAC). Immediately prior to administration of                         medications, the patient was re-assessed for adequacy                         to receive sedatives. The heart rate, respiratory                         rate, oxygen saturations, blood pressure, adequacy of                         pulmonary ventilation, and response to care were                         monitored throughout the procedure. The physical                         status of the patient was re-assessed after the                         procedure.                        After obtaining informed consent, the colonoscope was                         passed under direct vision. Throughout the procedure,                         the patient's blood pressure, pulse, and oxygen                         saturations were monitored continuously. The                         Colonoscope was introduced through the anus and  advanced to the the cecum, identified by appendiceal                         orifice and ileocecal valve. The colonoscopy was                         somewhat difficult due to significant looping.                         Successful completion of the procedure was aided by                         applying abdominal pressure. The patient tolerated the                         procedure well. The quality of the bowel preparation                         was adequate to identify polyps. Findings:      The perianal and digital rectal examinations were normal.      The entire examined colon appeared normal on direct and retroflexion       views. Due to significant  looping, unable to intubate the TI and       appendix was visualized but adequate picture not captured. Impression:            - The entire examined colon is normal on direct and                         retroflexion views.                        - No specimens collected. Recommendation:        - Return patient to hospital ward for ongoing care.                        - Clear liquid diet.                        - To visualize the small bowel, perform video capsule                         endoscopy tomorrow. Procedure Code(s):     --- Professional ---                        [phone], Colonoscopy, flexible; diagnostic, including                         collection of specimen(s) by brushing or washing, when                         performed (separate procedure) Diagnosis Code(s):     --- Professional ---                        K92.2, Gastrointestinal hemorrhage, unspecified CPT copyright 2019 [employer]. All rights reserved. The codes documented in this report are preliminary and upon coder review may  be revised to meet current compliance requirements. Andrey Farmer MD, MD 10/09/2021 9:09:54 AM  Number of Addenda: 0 Note Initiated On: 10/09/2021 7:04 AM Scope Withdrawal Time: 0 hours 5 minutes 34 seconds  Total Procedure Duration: 0 hours 15 minutes 35 seconds  Estimated Blood Loss:  Estimated blood loss: none.      [facility]

## 2021-10-09 NOTE — Progress Notes (Signed)
[facility] Cardiology  SUBJECTIVE: Patient laying in bed, denies chest pain   Vitals:   10/08/21 2100 10/08/21 2229 10/09/21 0100 10/09/21 0406  BP: (!) 153/73 (!) 153/68 116/60 140/65  Pulse: 70 74 73 72  Resp: 17 16 16 16   Temp:  98.4 F (36.9 C) 99.4 F (37.4 C) 98.4 F (36.9 C)  TempSrc:  Oral    SpO2: 94% 97% 96% 96%  Weight:      Height:         Intake/Output Summary (Last 24 hours) at 10/09/2021 13/10/2021 Last data filed at 10/09/2021 0500 Gross per 24 hour  Intake 657.7 ml  Output 1300 ml  Net -642.3 ml       PHYSICAL EXAM  General: Well developed, well nourished, in no acute distress HEENT:  Normocephalic and atramatic Neck:  No JVD.  Lungs: Clear bilaterally to auscultation and percussion. Heart: HRRR . Normal S1 and S2 without gallops or murmurs.  Abdomen: Bowel sounds are positive, abdomen soft and non-tender  Msk:  Back normal, normal gait. Normal strength and tone for age. Extremities: No clubbing, cyanosis or edema.   Neuro: Alert and oriented X 3. Psych:  Good affect, responds appropriately   LABS: Basic Metabolic Panel: Recent Labs    10/07/21 1212 10/08/21 0438  NA 138 138  K 4.5 4.3  CL 111 108  CO2 22 23  GLUCOSE 103* 114*  BUN 20 19  CREATININE 1.47* 1.44*  CALCIUM 8.5* 8.9    Liver Function Tests: Recent Labs    10/06/21 1620  AST 19  ALT 15  ALKPHOS 66  BILITOT 1.1  PROT 7.6  ALBUMIN 3.7    No results for input(s): LIPASE, AMYLASE in the last 72 hours. CBC: Recent Labs    10/06/21 1620 10/06/21 1955 10/08/21 0438 10/08/21 0731  WBC 4.7  --  6.0  --   HGB 6.9*   < > 8.8* 9.0*  HCT 22.6*   < > 27.8* 27.7*  MCV 103.2*  --  96.9  --   PLT 135*  --  127*  --    < > = values in this interval not displayed.    Cardiac Enzymes: No results for input(s): CKTOTAL, CKMB, CKMBINDEX, TROPONINI in the last 72 hours. BNP: Invalid input(s): POCBNP D-Dimer: No results for input(s): DDIMER in the last 72 hours. Hemoglobin  A1C: Recent Labs    10/07/21 1212  HGBA1C 6.9*    Fasting Lipid Panel: No results for input(s): CHOL, HDL, LDLCALC, TRIG, CHOLHDL, LDLDIRECT in the last 72 hours. Thyroid Function Tests: No results for input(s): TSH, T4TOTAL, T3FREE, THYROIDAB in the last 72 hours.  Invalid input(s): FREET3 Anemia Panel: No results for input(s): VITAMINB12, FOLATE, FERRITIN, TIBC, IRON, RETICCTPCT in the last 72 hours.  DG Chest Port 1 View  Result Date: 10/07/2021 CLINICAL DATA:  Wheezing. EXAM: PORTABLE CHEST 1 VIEW COMPARISON:  Chest radiograph dated 11/11/2019. FINDINGS: Mild cardiomegaly with vascular congestion and mild edema. Pneumonia is not excluded. Clinical correlation is recommended. No focal consolidation, pleural effusion or pneumothorax. Atherosclerotic calcification of the aorta. Median sternotomy wires and CABG vascular clips. No acute osseous pathology. IMPRESSION: Mild cardiomegaly with vascular congestion and mild edema. Pneumonia is not excluded. Electronically Signed   By: 11/13/2019 M.D.   On: 10/07/2021 21:57     Echo LVEF greater than 55% 07/01/2020  TELEMETRY: Atrial fibrillation 80 bpm:  ASSESSMENT AND PLAN:  Active Problems:   Symptomatic anemia   Type 2 diabetes mellitus  without complication, with long-term current use of insulin (HCC)   Warfarin anticoagulation   Primary hypertension   GI bleed    Acute GI bleed with hemoglobin dropped from 10.5 to 7.2 with melena, dyspnea with minimal exertion, and positional dizziness.  The patient is on warfarin for chronic atrial fibrillation.  He has a history of GI bleed in 2020.  EGD without evidence of major source of bleeding and colonoscopy to be performed today Chronic atrial fibrillation on warfarin, and metoprolol at home for rate control Mitral valve repair in 1999 Coronary artery disease, status post CABG x4 in 1999 and stent in OM1 (02/2009) and proximal LAD (06/2011)   Recommendations  Continue to hold  warfarin, which patient is taking for atrial fibrillation, not mitral valve repair. No bridge necessary. Resume as soon as safely possible after pleated GI work-up and resolution of GI bleed. Continue metoprolol to tartrate Hold aspirin and no particular reinstatement at this time.  Will consider the possibility of use as an outpatient Continue simvastatin No further cardiac diagnostics at this time 6.  Proceed to other gastric and/or: Diagnostics as necessary   Lamar Blinks, MD, PhD, [facility] 10/09/2021 6:55 AM

## 2021-10-09 NOTE — Transfer of Care (Signed)
Immediate Anesthesia Transfer of Care Note  Patient: Jeffrey Mills [facility]  Procedure(s) Performed: COLONOSCOPY WITH PROPOFOL  Patient Location: PACU  Anesthesia Type:MAC  Level of Consciousness: drowsy and patient cooperative  Airway & Oxygen Therapy: Patient Spontanous Breathing and Patient connected to nasal cannula oxygen  Post-op Assessment: Report given to RN and Post -op Vital signs reviewed and stable  Post vital signs: Reviewed and stable  Last Vitals:  Vitals Value Taken Time  BP 103/52 10/09/21 0904  Temp 36.6 C 10/09/21 0904  Pulse 70 10/09/21 0904  Resp 12 10/09/21 0904  SpO2 96 % 10/09/21 0904  Vitals shown include unvalidated device data.  Last Pain:  Vitals:   10/09/21 0904  TempSrc: Temporal  PainSc:          Complications: No notable events documented.

## 2021-10-09 NOTE — Progress Notes (Addendum)
PROGRESS NOTE  Jeffrey Mills    DOB: 1936/11/27, 85 y.o.  LMB:[id number]  PCP: Danella Penton, MD   Code Status: Full Code   DOA: 10/06/2021   LOS: 3  Brief Narrative of Current Hospitalization  Jeffrey Mills is a 85 y.o. male with a PMH significant for A. fib on Coumadin, iron deficiency anemia due to chronic blood loss, HLD, type II DM, CAD s/p PCI with stenting, CKDIII, severe mitral regurgitation, prior MI, mitral valve annuloplasty. They presented from home after abnormal labs from PCP office to the ED on 10/06/2021 with acute decline in hemoglobin from 11-7.2 in the setting of melena, DOE, positive orthostatics and fatigue.  Last EGD and colonoscopy 10/2019.  In the ED, it was found that they had hemoglobin 6.9.  Transfused with 2 units PRBCs as well as started on IV Protonix.  GI was consulted. Patient was admitted to medicine service for further workup and management of GI bleed as outlined in detail below.  10/09/21 -stable  Assessment & Plan  Active Problems:   Symptomatic anemia   Type 2 diabetes mellitus without complication, with long-term current use of insulin (HCC)   Warfarin anticoagulation   Primary hypertension   GI bleed  Acute GI bleed-hemoglobin remaining stable at 8.3> 8.6>9.0>8.8>9.2 s/p 2 units PRBCs 11/9.  Vital signs remained stable.  Transfusion threshold is 8. -GI following, appreciate recommendations  - EGD 11/11  - colonoscopy 11/12 -PO protonix -CBC a.m. - advance diet as tolerated.   A. Fib  mitral valve annuloplasty  CAD  HLD  HTN- home medications include Coumadin. HAS-BLED score 4. Blood pressures elevated since admission - cardiology following, appreciate recommendations -Repeat INR -Continue holding Coumadin, ASA. Can he be switched to NOAC? - continue home simvastatin - continue home metoprolol -Holding home Lasix  AKI on CKD IIIa- baseline around 1.2. Admission Cr 1.52 likely from hypovolemia.  receiving fluid rehydration.  -  repeat BMP am  Type II DM- A1c 6.9 on admission. Blood sugars moderately well controlled.  - sSSI when taking PO  Iron deficiency anemia- not taking iron supplement for several weeks so unlikely to contribute to his finding of melena. -CBC a.m.  Anxiety/depression-chronic, stable -Continue home bupropion, Lexapro, as needed lorazepam - consider weaning off lexapro as can be associated with poor platelet function  DVT prophylaxis: SCDs Start: 10/06/21 1807   Diet:  Diet Orders (From admission, onward)     Start     Ordered   10/09/21 1031  Diet clear liquid Room service appropriate? Yes; Fluid consistency: Thin  Diet effective now       Question Answer Comment  Room service appropriate? Yes   Fluid consistency: Thin      10/09/21 1030            Subjective 10/09/21    Pt reports feeling sleepy (post-procedure) but overall doing good. He tolerated clear liquid lunch.   Disposition Plan & Communication  Patient status: Inpatient  Admitted From: Home Disposition: Home Anticipated discharge date: 11/13  Family Communication: None Consults, Procedures, Significant Events  Consultants:  Cardiology GI  Procedures/significant events:  None at this time Antimicrobials:  Anti-infectives (From admission, onward)    None       Objective   Vitals:   10/09/21 0930 10/09/21 0939 10/09/21 1021 10/09/21 1222  BP: (!) 117/54 (!) 120/50 (!) 143/71 (!) 151/76  Pulse: 79 71 71 72  Resp: 14 13 17 19   Temp: 97.8 F (36.6 C)  98 F (36.7 C) 98.5 F (36.9 C)  TempSrc:      SpO2: 97% 95% 93% 97%  Weight:      Height:        Intake/Output Summary (Last 24 hours) at 10/09/2021 1321 Last data filed at 10/09/2021 0500 Gross per 24 hour  Intake 557.7 ml  Output 550 ml  Net 7.7 ml   Filed Weights   10/06/21 1618 10/08/21 1012  Weight: 106.9 kg 106.9 kg    Patient BMI: Body mass index is 33.82 kg/m.   Physical Exam: General: awake, alert, NAD HEENT: atraumatic,  clear conjunctiva, anicteric sclera, moist mucus membranes, hearing grossly normal Respiratory: normal respiratory effort. Cardiovascular: normal S1/S2,  RRR, no JVD, murmurs, rubs, gallops, quick capillary refill  Gastrointestinal: soft, NT, ND, no HSM felt Nervous: A&O x3. no gross focal neurologic deficits, normal speech Extremities: moves all equall, no edema, normal tone Skin: dry, intact, normal temperature, normal color, No rashes, lesions or ulcers Psychiatry: normal mood, congruent affect  Labs   I have personally reviewed following labs and imaging studies Admission on 10/06/2021  Component Date Value Ref Range Status   Sodium 10/06/2021 137  135 - 145 mmol/L Final   Potassium 10/06/2021 4.5  3.5 - 5.1 mmol/L Final   Chloride 10/06/2021 110  98 - 111 mmol/L Final   CO2 10/06/2021 19 (A)  22 - 32 mmol/L Final   Glucose, Bld 10/06/2021 88  70 - 99 mg/dL Final   BUN 34/19/6222 23  8 - 23 mg/dL Final   Creatinine, Ser 10/06/2021 1.52 (A)  0.61 - 1.24 mg/dL Final   Calcium 97/98/9211 8.4 (A)  8.9 - 10.3 mg/dL Final   Total Protein 94/17/4081 7.6  6.5 - 8.1 g/dL Final   Albumin 44/81/8563 3.7  3.5 - 5.0 g/dL Final   AST 14/97/0263 19  15 - 41 U/L Final   ALT 10/06/2021 15  0 - 44 U/L Final   Alkaline Phosphatase 10/06/2021 66  38 - 126 U/L Final   Total Bilirubin 10/06/2021 1.1  0.3 - 1.2 mg/dL Final   GFR, Estimated 10/06/2021 45 (A)  >60 mL/min Final   Anion gap 10/06/2021 8  5 - 15 Final   WBC 10/06/2021 4.7  4.0 - 10.5 K/uL Final   RBC 10/06/2021 2.19 (A)  4.22 - 5.81 MIL/uL Final   Hemoglobin 10/06/2021 6.9 (A)  13.0 - 17.0 g/dL Final   HCT 78/58/8502 22.6 (A)  39.0 - 52.0 % Final   MCV 10/06/2021 103.2 (A)  80.0 - 100.0 fL Final   MCH 10/06/2021 31.5  26.0 - 34.0 pg Final   MCHC 10/06/2021 30.5  30.0 - 36.0 g/dL Final   RDW 77/41/2878 17.1 (A)  11.5 - 15.5 % Final   Platelets 10/06/2021 135 (A)  150 - 400 K/uL Final   nRBC 10/06/2021 0.0  0.0 - 0.2 % Final   ABO/RH(D)  10/06/2021 A NEG   Final   Antibody Screen 10/06/2021 POS   Final   Sample Expiration 10/06/2021 10/09/2021,2359   Final   Antibody Identification 10/06/2021 ANTI D ANTI C   Final   Unit Number 10/06/2021 [id number]   Final   Blood Component Type 10/06/2021 RED CELLS,LR   Final   Unit division 10/06/2021 00   Final   Status of Unit 10/06/2021 ISSUED,FINAL   Final   Transfusion Status 10/06/2021 OK TO TRANSFUSE   Final   Crossmatch Result 10/06/2021 COMPATIBLE   Final   Donor AG Type  10/06/2021    Final                   Value:NEGATIVE FOR C ANTIGEN Performed at [facility], [address] [place], [place] [address]    Unit Number 10/06/2021 [id number]   Final   Blood Component Type 10/06/2021 RED CELLS,LR   Final   Unit division 10/06/2021 00   Final   Status of Unit 10/06/2021 ISSUED,FINAL   Final   Transfusion Status 10/06/2021 OK TO TRANSFUSE   Final   Crossmatch Result 10/06/2021 COMPATIBLE   Final   Donor AG Type 10/06/2021 NEGATIVE FOR C ANTIGEN   Final   Unit Number 10/06/2021 [id number]   Final   Blood Component Type 10/06/2021 RBC LR PHER1   Final   Unit division 10/06/2021 00   Final   Status of Unit 10/06/2021 ALLOCATED   Final   Transfusion Status 10/06/2021 OK TO TRANSFUSE   Final   Crossmatch Result 10/06/2021 COMPATIBLE   Final   Unit Number 10/06/2021 [id number]   Final   Blood Component Type 10/06/2021 RED CELLS,LR   Final   Unit division 10/06/2021 00   Final   Status of Unit 10/06/2021 ALLOCATED   Final   Transfusion Status 10/06/2021 OK TO TRANSFUSE   Final   Crossmatch Result 10/06/2021 COMPATIBLE   Final   Prothrombin Time 10/06/2021 19.5 (A)  11.4 - 15.2 seconds Final   INR 10/06/2021 1.7 (A)  0.8 - 1.2 Final   Order Confirmation 10/06/2021    Final                   Value:ORDER PROCESSED BY BLOOD BANK Performed at [facility], [address] [place], [place] [address]    SARS Coronavirus 2 by RT PCR 10/06/2021 NEGATIVE   NEGATIVE Final   Influenza A by PCR 10/06/2021 NEGATIVE  NEGATIVE Final   Influenza B by PCR 10/06/2021 NEGATIVE  NEGATIVE Final   Hemoglobin 10/06/2021 6.9 (A)  13.0 - 17.0 g/dL Final   HCT 42/87/6811 21.9 (A)  39.0 - 52.0 % Final   Hemoglobin 10/07/2021 8.3 (A)  13.0 - 17.0 g/dL Final   HCT 57/26/2035 25.7 (A)  39.0 - 52.0 % Final   Hemoglobin 10/07/2021 8.6 (A)  13.0 - 17.0 g/dL Final   HCT 59/74/1638 27.2 (A)  39.0 - 52.0 % Final   Glucose-Capillary 10/06/2021 76  70 - 99 mg/dL Final   ISSUE DATE / TIME 10/06/2021 [id number]   Final   Blood Product Unit Number 10/06/2021 [id number]   Final   PRODUCT CODE 10/06/2021 [id number]   Final   Unit Type and Rh 10/06/2021 0600   Final   Blood Product Expiration Date 10/06/2021 [id number]   Final   ISSUE DATE / TIME 10/06/2021 [id number]   Final   Blood Product Unit Number 10/06/2021 [id number]   Final   PRODUCT CODE 10/06/2021 [id number]   Final   Unit Type and Rh 10/06/2021 0600   Final   Blood Product Expiration Date 10/06/2021 [id number]   Final   Blood Product Unit Number 10/06/2021 [id number]   Final   PRODUCT CODE 10/06/2021 [id number]   Final   Unit Type and Rh 10/06/2021 0600   Final   Blood Product Expiration Date 10/06/2021 [id number]   Final   Blood Product Unit Number 10/06/2021 [id number]   Final   PRODUCT CODE 10/06/2021 [id number]   Final   Unit Type and Rh 10/06/2021 0600   Final  Blood Product Expiration Date 10/06/2021 [id number]   Final   Hemoglobin 10/07/2021 7.9 (A)  13.0 - 17.0 g/dL Final   HCT 42/59/5638 24.7 (A)  39.0 - 52.0 % Final   Hemoglobin 10/07/2021 8.7 (A)  13.0 - 17.0 g/dL Final   HCT 75/64/3329 26.8 (A)  39.0 - 52.0 % Final   MRSA by PCR Next Gen 10/07/2021 NOT DETECTED  NOT DETECTED Final   Glucose-Capillary 10/07/2021 110 (A)  70 - 99 mg/dL Final   Comment 1 51/88/4166 Notify RN   Final   Comment 2 10/07/2021 Document in Chart   Final   Glucose-Capillary 10/07/2021 81  70 - 99 mg/dL  Final   Comment 1 05/27/1600 Notify RN   Final   Comment 2 10/07/2021 Document in Chart   Final   Glucose-Capillary 10/07/2021 56 (A)  70 - 99 mg/dL Final   Glucose-Capillary 10/07/2021 98  70 - 99 mg/dL Final   Prothrombin Time 10/07/2021 20.4 (A)  11.4 - 15.2 seconds Final   INR 10/07/2021 1.7 (A)  0.8 - 1.2 Final   Glucose-Capillary 10/07/2021 65 (A)  70 - 99 mg/dL Final   Sodium 09/32/3557 138  135 - 145 mmol/L Final   Potassium 10/07/2021 4.5  3.5 - 5.1 mmol/L Final   Chloride 10/07/2021 111  98 - 111 mmol/L Final   CO2 10/07/2021 22  22 - 32 mmol/L Final   Glucose, Bld 10/07/2021 103 (A)  70 - 99 mg/dL Final   BUN 32/20/2542 20  8 - 23 mg/dL Final   Creatinine, Ser 10/07/2021 1.47 (A)  0.61 - 1.24 mg/dL Final   Calcium 70/62/3762 8.5 (A)  8.9 - 10.3 mg/dL Final   GFR, Estimated 10/07/2021 47 (A)  >60 mL/min Final   Anion gap 10/07/2021 5  5 - 15 Final   Hgb A1c MFr Bld 10/07/2021 6.9 (A)  4.8 - 5.6 % Final   Mean Plasma Glucose 10/07/2021 151.33  mg/dL Final   Hemoglobin 83/15/1761 9.0 (A)  13.0 - 17.0 g/dL Final   HCT 60/73/7106 27.5 (A)  39.0 - 52.0 % Final   Glucose-Capillary 10/07/2021 102 (A)  70 - 99 mg/dL Final   Glucose-Capillary 10/07/2021 65 (A)  70 - 99 mg/dL Final   Sodium 26/94/8546 138  135 - 145 mmol/L Final   Potassium 10/08/2021 4.3  3.5 - 5.1 mmol/L Final   Chloride 10/08/2021 108  98 - 111 mmol/L Final   CO2 10/08/2021 23  22 - 32 mmol/L Final   Glucose, Bld 10/08/2021 114 (A)  70 - 99 mg/dL Final   BUN 27/01/5008 19  8 - 23 mg/dL Final   Creatinine, Ser 10/08/2021 1.44 (A)  0.61 - 1.24 mg/dL Final   Calcium 38/18/2993 8.9  8.9 - 10.3 mg/dL Final   GFR, Estimated 10/08/2021 48 (A)  >60 mL/min Final   Anion gap 10/08/2021 7  5 - 15 Final   WBC 10/08/2021 6.0  4.0 - 10.5 K/uL Final   RBC 10/08/2021 2.87 (A)  4.22 - 5.81 MIL/uL Final   Hemoglobin 10/08/2021 8.8 (A)  13.0 - 17.0 g/dL Final   HCT 71/69/6789 27.8 (A)  39.0 - 52.0 % Final   MCV 10/08/2021 96.9   80.0 - 100.0 fL Final   MCH 10/08/2021 30.7  26.0 - 34.0 pg Final   MCHC 10/08/2021 31.7  30.0 - 36.0 g/dL Final   RDW 38/08/1750 18.2 (A)  11.5 - 15.5 % Final   Platelets 10/08/2021 127 (A)  150 -  400 K/uL Final   nRBC 10/08/2021 0.0  0.0 - 0.2 % Final   Prothrombin Time 10/08/2021 19.3 (A)  11.4 - 15.2 seconds Final   INR 10/08/2021 1.6 (A)  0.8 - 1.2 Final   Glucose-Capillary 10/07/2021 78  70 - 99 mg/dL Final   Hemoglobin 19/14/7829 9.0 (A)  13.0 - 17.0 g/dL Final   HCT 56/21/3086 27.7 (A)  39.0 - 52.0 % Final   Glucose-Capillary 10/07/2021 73  70 - 99 mg/dL Final   Procalcitonin 57/84/6962 <0.10  ng/mL Final   Glucose-Capillary 10/07/2021 88  70 - 99 mg/dL Final   Glucose-Capillary 10/08/2021 115 (A)  70 - 99 mg/dL Final   Glucose-Capillary 10/08/2021 110 (A)  70 - 99 mg/dL Final   Glucose-Capillary 10/08/2021 114 (A)  70 - 99 mg/dL Final   Glucose-Capillary 10/08/2021 105 (A)  70 - 99 mg/dL Final   Glucose-Capillary 10/08/2021 155 (A)  70 - 99 mg/dL Final   WBC 95/28/4132 5.3  4.0 - 10.5 K/uL Final   RBC 10/09/2021 2.87 (A)  4.22 - 5.81 MIL/uL Final   Hemoglobin 10/09/2021 9.2 (A)  13.0 - 17.0 g/dL Final   HCT 44/11/270 28.3 (A)  39.0 - 52.0 % Final   MCV 10/09/2021 98.6  80.0 - 100.0 fL Final   MCH 10/09/2021 32.1  26.0 - 34.0 pg Final   MCHC 10/09/2021 32.5  30.0 - 36.0 g/dL Final   RDW 53/66/4403 17.5 (A)  11.5 - 15.5 % Final   Platelets 10/09/2021 140 (A)  150 - 400 K/uL Final   nRBC 10/09/2021 0.0  0.0 - 0.2 % Final   Prothrombin Time 10/09/2021 18.9 (A)  11.4 - 15.2 seconds Final   INR 10/09/2021 1.6 (A)  0.8 - 1.2 Final   Sodium 10/09/2021 137  135 - 145 mmol/L Final   Potassium 10/09/2021 4.5  3.5 - 5.1 mmol/L Final   Chloride 10/09/2021 104  98 - 111 mmol/L Final   CO2 10/09/2021 24  22 - 32 mmol/L Final   Glucose, Bld 10/09/2021 140 (A)  70 - 99 mg/dL Final   BUN 47/42/5956 17  8 - 23 mg/dL Final   Creatinine, Ser 10/09/2021 1.51 (A)  0.61 - 1.24 mg/dL Final    Calcium 38/75/6433 9.0  8.9 - 10.3 mg/dL Final   GFR, Estimated 10/09/2021 46 (A)  >60 mL/min Final   Anion gap 10/09/2021 9  5 - 15 Final   Glucose-Capillary 10/08/2021 139 (A)  70 - 99 mg/dL Final   Glucose-Capillary 10/09/2021 132 (A)  70 - 99 mg/dL Final   Glucose-Capillary 10/09/2021 121 (A)  70 - 99 mg/dL Final   Glucose-Capillary 10/09/2021 159 (A)  70 - 99 mg/dL Final    Imaging Studies  No results found. Medications   Scheduled Meds:  brimonidine  1 drop Both Eyes BID   Chlorhexidine Gluconate Cloth  6 each Topical Q0600   escitalopram  10 mg Oral Daily   latanoprost  1 drop Both Eyes QHS   metoprolol succinate  25 mg Oral BID   pantoprazole  40 mg Oral Daily   simvastatin  40 mg Oral Daily   No recently discontinued medications to reconcile  LOS: 3 days   Time spent: >40min  Leeroy Bock, DO [facility] 10/09/2021, 1:21 PM   Please refer to amion to contact the [facility] Attending or Consulting provider for this pt  [email or web address] Available by Epic secure chat 7AM-7PM. If 7PM-7AM, please contact night-coverage

## 2021-10-10 ENCOUNTER — Encounter
Admission: EM | Disposition: A | Discharge status: Home / Self Care | Payer: Self-pay | Source: Home / Self Care | Attending: Student in an Organized Health Care Education/Training Program

## 2021-10-10 DIAGNOSIS — I10 Essential (primary) hypertension: Secondary | ICD-10-CM | POA: Diagnosis not present

## 2021-10-10 DIAGNOSIS — D649 Anemia, unspecified: Secondary | ICD-10-CM | POA: Diagnosis not present

## 2021-10-10 DIAGNOSIS — E119 Type 2 diabetes mellitus without complications: Secondary | ICD-10-CM | POA: Diagnosis not present

## 2021-10-10 DIAGNOSIS — K922 Gastrointestinal hemorrhage, unspecified: Secondary | ICD-10-CM | POA: Diagnosis not present

## 2021-10-10 HISTORY — PX: GIVENS CAPSULE STUDY: SHX5432

## 2021-10-10 LAB — CBC
HCT: 27.7 % — ABNORMAL LOW (ref 39.0–52.0)
Hemoglobin: 9.2 g/dL — ABNORMAL LOW (ref 13.0–17.0)
MCH: 32.1 pg (ref 26.0–34.0)
MCHC: 33.2 g/dL (ref 30.0–36.0)
MCV: 96.5 fL (ref 80.0–100.0)
Platelets: 140 10*3/uL — ABNORMAL LOW (ref 150–400)
RBC: 2.87 MIL/uL — ABNORMAL LOW (ref 4.22–5.81)
RDW: 17.2 % — ABNORMAL HIGH (ref 11.5–15.5)
WBC: 4.6 10*3/uL (ref 4.0–10.5)
nRBC: 0 % (ref 0.0–0.2)

## 2021-10-10 LAB — GLUCOSE, CAPILLARY
Glucose-Capillary: 169 mg/dL — ABNORMAL HIGH (ref 70–99)
Glucose-Capillary: 166 mg/dL — ABNORMAL HIGH (ref 70–99)
Glucose-Capillary: 164 mg/dL — ABNORMAL HIGH (ref 70–99)
Glucose-Capillary: 237 mg/dL — ABNORMAL HIGH (ref 70–99)
Glucose-Capillary: 246 mg/dL — ABNORMAL HIGH (ref 70–99)

## 2021-10-10 LAB — BPAM RBC
Blood Product Expiration Date: 202211292359
Blood Product Expiration Date: 202211292359
Blood Product Expiration Date: 202211302359
Blood Product Expiration Date: 202212102359
ISSUE DATE / TIME: 202211092058
ISSUE DATE / TIME: 202211092342
Unit Type and Rh: 600
Unit Type and Rh: 600
Unit Type and Rh: 600
Unit Type and Rh: 600

## 2021-10-10 LAB — TYPE AND SCREEN
ABO/RH(D): A NEG
Antibody Screen: POSITIVE
Donor AG Type: NEGATIVE
Donor AG Type: NEGATIVE
Unit division: 0
Unit division: 0
Unit division: 0
Unit division: 0

## 2021-10-10 SURGERY — IMAGING PROCEDURE, GI TRACT, INTRALUMINAL, VIA CAPSULE

## 2021-10-10 NOTE — Progress Notes (Signed)
Video capsule equipment was removed at 1615. Mr. Opiela denied having any abdominal distress or discomfort. The recorder was returned to Endoscopy unit for download. Images were visualized on the screen after about 15 minutes.

## 2021-10-10 NOTE — Progress Notes (Signed)
PROGRESS NOTE  Jeffrey Mills    DOB: Feb 09, 1936, 85 y.o.  WUJ:[id number]  PCP: Danella Penton, MD   Code Status: Full Code   DOA: 10/06/2021   LOS: 4  Brief Narrative of Current Hospitalization  Jeffrey Mills is a 85 y.o. male with a PMH significant for A. fib on Coumadin, iron deficiency anemia due to chronic blood loss, HLD, type II DM, CAD s/p PCI with stenting, CKDIII, severe mitral regurgitation, prior MI, mitral valve annuloplasty. They presented from home after abnormal labs from PCP office to the ED on 10/06/2021 with acute decline in hemoglobin from 11-7.2 in the setting of melena, DOE, positive orthostatics and fatigue.  Last EGD and colonoscopy 10/2019.  In the ED, it was found that they had hemoglobin 6.9.  Transfused with 2 units PRBCs as well as started on IV Protonix.  GI was consulted. Patient was admitted to medicine service for further workup and management of GI bleed as outlined in detail below.  10/10/21 -stable  Assessment & Plan  Active Problems:   Symptomatic anemia   Type 2 diabetes mellitus without complication, with long-term current use of insulin (HCC)   Warfarin anticoagulation   Primary hypertension   GI bleed  Acute GI bleed-hemoglobin remaining stable at 8.3> 8.6>9.0>8.8>9.2 s/p 2 units PRBCs 11/9.  Vital signs remained stable.  Transfusion threshold is 8. -GI following, appreciate recommendations  - EGD 11/11  - colonoscopy 11/12  - capsule study 11/13 -PO protonix -CBC a.m. - advance diet as tolerated.  - PT/OT  A. Fib  mitral valve annuloplasty  CAD  HLD  HTN- home medications include Coumadin. HAS-BLED score 4. Blood pressures elevated since admission - cardiology following, appreciate recommendations -Repeat INR -Continue holding Coumadin, ASA. Can he be switched to NOAC? - continue home simvastatin - continue home metoprolol -Holding home Lasix  AKI on CKD IIIa- baseline around 1.2. Admission Cr 1.52 likely from hypovolemia.   receiving fluid rehydration.  - repeat BMP am  Type II DM- A1c 6.9 on admission. Blood sugars moderately well controlled.  - sSSI when taking PO  Iron deficiency anemia- not taking iron supplement for several weeks so unlikely to contribute to his finding of melena. -CBC a.m.  Anxiety/depression-chronic, stable -Continue home bupropion, Lexapro, as needed lorazepam - consider weaning off lexapro as can be associated with poor platelet function  DVT prophylaxis: SCDs Start: 10/06/21 1807   Diet:  Diet Orders (From admission, onward)     Start     Ordered   10/10/21 0001  Diet NPO time specified  Diet effective midnight        10/09/21 1328            Subjective 10/10/21    Pt reports feeling well and wants to go home. He is feeling somewhat weaker than his baseline. Able to stand independently at sink but is shaky.   Disposition Plan & Communication  Patient status: Inpatient  Admitted From: Home Disposition: Home Anticipated discharge date: 11/14  Family Communication: None Consults, Procedures, Significant Events  Consultants:  Cardiology GI  Procedures/significant events:  EGD, colonoscopy, capsule study Antimicrobials:  Anti-infectives (From admission, onward)    None       Objective   Vitals:   10/09/21 1532 10/09/21 2108 10/10/21 0006 10/10/21 0449  BP: (!) 149/72 (!) 155/57 (!) 124/55 (!) 154/91  Pulse: 65 66 71 67  Resp: Temp: 98.7 F (37.1 C) 98.2 F (36.8 C) (!)  97.5 F (36.4 C) 98.2 F (36.8 C)  TempSrc:      SpO2: 96% 96% 100% 96%  Weight:      Height:        Intake/Output Summary (Last 24 hours) at 10/10/2021 0805 Last data filed at 10/09/2021 2100 Gross per 24 hour  Intake 120 ml  Output 280 ml  Net -160 ml    Filed Weights   10/06/21 1618 10/08/21 1012  Weight: 106.9 kg 106.9 kg    Patient BMI: Body mass index is 33.82 kg/m.   Physical Exam: General: awake, alert, NAD HEENT: atraumatic, clear  conjunctiva, anicteric sclera, moist mucus membranes, hearing grossly normal Respiratory: normal respiratory effort. Cardiovascular: normal S1/S2,  RRR, no JVD, murmurs, rubs, gallops, quick capillary refill  Gastrointestinal: soft, NT, ND, no HSM felt Nervous: A&O x3. no gross focal neurologic deficits, normal speech Extremities: moves all equall, no edema, normal tone Skin: dry, intact, normal temperature, normal color, No rashes, lesions or ulcers Psychiatry: normal mood, congruent affect  Labs   I have personally reviewed following labs and imaging studies Admission on 10/06/2021  Component Date Value Ref Range Status   Sodium 10/06/2021 137  135 - 145 mmol/L Final   Potassium 10/06/2021 4.5  3.5 - 5.1 mmol/L Final   Chloride 10/06/2021 110  98 - 111 mmol/L Final   CO2 10/06/2021 19 (A)  22 - 32 mmol/L Final   Glucose, Bld 10/06/2021 88  70 - 99 mg/dL Final   BUN 98/92/1194 23  8 - 23 mg/dL Final   Creatinine, Ser 10/06/2021 1.52 (A)  0.61 - 1.24 mg/dL Final   Calcium 17/40/8144 8.4 (A)  8.9 - 10.3 mg/dL Final   Total Protein 81/85/6314 7.6  6.5 - 8.1 g/dL Final   Albumin 97/12/6376 3.7  3.5 - 5.0 g/dL Final   AST 58/85/0277 19  15 - 41 U/L Final   ALT 10/06/2021 15  0 - 44 U/L Final   Alkaline Phosphatase 10/06/2021 66  38 - 126 U/L Final   Total Bilirubin 10/06/2021 1.1  0.3 - 1.2 mg/dL Final   GFR, Estimated 10/06/2021 45 (A)  >60 mL/min Final   Anion gap 10/06/2021 8  5 - 15 Final   WBC 10/06/2021 4.7  4.0 - 10.5 K/uL Final   RBC 10/06/2021 2.19 (A)  4.22 - 5.81 MIL/uL Final   Hemoglobin 10/06/2021 6.9 (A)  13.0 - 17.0 g/dL Final   HCT 41/28/7867 22.6 (A)  39.0 - 52.0 % Final   MCV 10/06/2021 103.2 (A)  80.0 - 100.0 fL Final   MCH 10/06/2021 31.5  26.0 - 34.0 pg Final   MCHC 10/06/2021 30.5  30.0 - 36.0 g/dL Final   RDW 67/20/9470 17.1 (A)  11.5 - 15.5 % Final   Platelets 10/06/2021 135 (A)  150 - 400 K/uL Final   nRBC 10/06/2021 0.0  0.0 - 0.2 % Final   ABO/RH(D)  10/06/2021 A NEG   Final   Antibody Screen 10/06/2021 POS   Final   Sample Expiration 10/06/2021 10/09/2021,2359   Final   Antibody Identification 10/06/2021 ANTI D ANTI C   Final   Unit Number 10/06/2021 [id number]   Final   Blood Component Type 10/06/2021 RED CELLS,LR   Final   Unit division 10/06/2021 00   Final   Status of Unit 10/06/2021 ISSUED,FINAL   Final   Transfusion Status 10/06/2021 OK TO TRANSFUSE   Final   Crossmatch Result 10/06/2021 COMPATIBLE   Final   Donor AG  Type 10/06/2021 NEGATIVE FOR C ANTIGEN   Final   Unit Number 10/06/2021 [id number]   Final   Blood Component Type 10/06/2021 RED CELLS,LR   Final   Unit division 10/06/2021 00   Final   Status of Unit 10/06/2021 ISSUED,FINAL   Final   Transfusion Status 10/06/2021 OK TO TRANSFUSE   Final   Crossmatch Result 10/06/2021 COMPATIBLE   Final   Donor AG Type 10/06/2021 NEGATIVE FOR C ANTIGEN   Final   Unit Number 10/06/2021 [id number]   Final   Blood Component Type 10/06/2021 RBC LR PHER1   Final   Unit division 10/06/2021 00   Final   Status of Unit 10/06/2021 REL FROM [facility]   Final   Transfusion Status 10/06/2021 OK TO TRANSFUSE   Final   Crossmatch Result 10/06/2021 COMPATIBLE   Final   Unit Number 10/06/2021 [id number]   Final   Blood Component Type 10/06/2021 RED CELLS,LR   Final   Unit division 10/06/2021 00   Final   Status of Unit 10/06/2021 REL FROM [facility]   Final   Transfusion Status 10/06/2021 OK TO TRANSFUSE   Final   Crossmatch Result 10/06/2021 COMPATIBLE   Final   Prothrombin Time 10/06/2021 19.5 (A)  11.4 - 15.2 seconds Final   INR 10/06/2021 1.7 (A)  0.8 - 1.2 Final   Order Confirmation 10/06/2021    Final                   Value:ORDER PROCESSED BY BLOOD BANK Performed at [facility], [address] [place], [place] [address]    SARS Coronavirus 2 by RT PCR 10/06/2021 NEGATIVE  NEGATIVE Final   Influenza A by PCR 10/06/2021 NEGATIVE  NEGATIVE Final   Influenza B by PCR  10/06/2021 NEGATIVE  NEGATIVE Final   Hemoglobin 10/06/2021 6.9 (A)  13.0 - 17.0 g/dL Final   HCT 42/59/5638 21.9 (A)  39.0 - 52.0 % Final   Hemoglobin 10/07/2021 8.3 (A)  13.0 - 17.0 g/dL Final   HCT 75/64/3329 25.7 (A)  39.0 - 52.0 % Final   Hemoglobin 10/07/2021 8.6 (A)  13.0 - 17.0 g/dL Final   HCT 51/88/4166 27.2 (A)  39.0 - 52.0 % Final   Glucose-Capillary 10/06/2021 76  70 - 99 mg/dL Final   ISSUE DATE / TIME 10/06/2021 [id number]   Final   Blood Product Unit Number 10/06/2021 [id number]   Final   PRODUCT CODE 10/06/2021 [id number]   Final   Unit Type and Rh 10/06/2021 0600   Final   Blood Product Expiration Date 10/06/2021 [id number]   Final   ISSUE DATE / TIME 10/06/2021 [id number]   Final   Blood Product Unit Number 10/06/2021 [id number]   Final   PRODUCT CODE 10/06/2021 [id number]   Final   Unit Type and Rh 10/06/2021 0600   Final   Blood Product Expiration Date 10/06/2021 [id number]   Final   Blood Product Unit Number 10/06/2021 [id number]   Final   PRODUCT CODE 10/06/2021 [id number]   Final   Unit Type and Rh 10/06/2021 0600   Final   Blood Product Expiration Date 10/06/2021 [id number]   Final   Blood Product Unit Number 10/06/2021 [id number]   Final   PRODUCT CODE 10/06/2021 [id number]   Final   Unit Type and Rh 10/06/2021 0600   Final   Blood Product Expiration Date 10/06/2021 [id number]   Final   Hemoglobin 10/07/2021 7.9 (A)  13.0 - 17.0 g/dL Final   HCT 99/83/3825 24.7 (A)  39.0 - 52.0 % Final   Hemoglobin 10/07/2021 8.7 (A)  13.0 - 17.0 g/dL Final   HCT 00/93/8182 26.8 (A)  39.0 - 52.0 % Final   MRSA by PCR Next Gen 10/07/2021 NOT DETECTED  NOT DETECTED Final   Glucose-Capillary 10/07/2021 110 (A)  70 - 99 mg/dL Final   Comment 1 99/37/1696 Notify RN   Final   Comment 2 10/07/2021 Document in Chart   Final   Glucose-Capillary 10/07/2021 81  70 - 99 mg/dL Final   Comment 1 78/93/8101 Notify RN   Final   Comment 2 10/07/2021 Document in Chart    Final   Glucose-Capillary 10/07/2021 56 (A)  70 - 99 mg/dL Final   Glucose-Capillary 10/07/2021 98  70 - 99 mg/dL Final   Prothrombin Time 10/07/2021 20.4 (A)  11.4 - 15.2 seconds Final   INR 10/07/2021 1.7 (A)  0.8 - 1.2 Final   Glucose-Capillary 10/07/2021 65 (A)  70 - 99 mg/dL Final   Sodium 75/08/2584 138  135 - 145 mmol/L Final   Potassium 10/07/2021 4.5  3.5 - 5.1 mmol/L Final   Chloride 10/07/2021 111  98 - 111 mmol/L Final   CO2 10/07/2021 22  22 - 32 mmol/L Final   Glucose, Bld 10/07/2021 103 (A)  70 - 99 mg/dL Final   BUN 27/78/2423 20  8 - 23 mg/dL Final   Creatinine, Ser 10/07/2021 1.47 (A)  0.61 - 1.24 mg/dL Final   Calcium 53/61/4431 8.5 (A)  8.9 - 10.3 mg/dL Final   GFR, Estimated 10/07/2021 47 (A)  >60 mL/min Final   Anion gap 10/07/2021 5  5 - 15 Final   Hgb A1c MFr Bld 10/07/2021 6.9 (A)  4.8 - 5.6 % Final   Mean Plasma Glucose 10/07/2021 151.33  mg/dL Final   Hemoglobin 54/00/8676 9.0 (A)  13.0 - 17.0 g/dL Final   HCT 19/50/9326 27.5 (A)  39.0 - 52.0 % Final   Glucose-Capillary 10/07/2021 102 (A)  70 - 99 mg/dL Final   Glucose-Capillary 10/07/2021 65 (A)  70 - 99 mg/dL Final   Sodium 71/24/5809 138  135 - 145 mmol/L Final   Potassium 10/08/2021 4.3  3.5 - 5.1 mmol/L Final   Chloride 10/08/2021 108  98 - 111 mmol/L Final   CO2 10/08/2021 23  22 - 32 mmol/L Final   Glucose, Bld 10/08/2021 114 (A)  70 - 99 mg/dL Final   BUN 98/33/8250 19  8 - 23 mg/dL Final   Creatinine, Ser 10/08/2021 1.44 (A)  0.61 - 1.24 mg/dL Final   Calcium 53/97/6734 8.9  8.9 - 10.3 mg/dL Final   GFR, Estimated 10/08/2021 48 (A)  >60 mL/min Final   Anion gap 10/08/2021 7  5 - 15 Final   WBC 10/08/2021 6.0  4.0 - 10.5 K/uL Final   RBC 10/08/2021 2.87 (A)  4.22 - 5.81 MIL/uL Final   Hemoglobin 10/08/2021 8.8 (A)  13.0 - 17.0 g/dL Final   HCT 19/37/9024 27.8 (A)  39.0 - 52.0 % Final   MCV 10/08/2021 96.9  80.0 - 100.0 fL Final   MCH 10/08/2021 30.7  26.0 - 34.0 pg Final   MCHC 10/08/2021 31.7   30.0 - 36.0 g/dL Final   RDW 09/73/5329 18.2 (A)  11.5 - 15.5 % Final   Platelets 10/08/2021 127 (A)  150 - 400 K/uL Final   nRBC 10/08/2021 0.0  0.0 - 0.2 % Final   Prothrombin Time 10/08/2021 19.3 (A)  11.4 - 15.2 seconds Final  INR 10/08/2021 1.6 (A)  0.8 - 1.2 Final   Glucose-Capillary 10/07/2021 78  70 - 99 mg/dL Final   Hemoglobin 38/18/2993 9.0 (A)  13.0 - 17.0 g/dL Final   HCT 71/69/6789 27.7 (A)  39.0 - 52.0 % Final   Glucose-Capillary 10/07/2021 73  70 - 99 mg/dL Final   Procalcitonin 38/08/1750 <0.10  ng/mL Final   Glucose-Capillary 10/07/2021 88  70 - 99 mg/dL Final   Glucose-Capillary 10/08/2021 115 (A)  70 - 99 mg/dL Final   Glucose-Capillary 10/08/2021 110 (A)  70 - 99 mg/dL Final   Glucose-Capillary 10/08/2021 114 (A)  70 - 99 mg/dL Final   Glucose-Capillary 10/08/2021 105 (A)  70 - 99 mg/dL Final   Glucose-Capillary 10/08/2021 155 (A)  70 - 99 mg/dL Final   WBC 02/58/5277 5.3  4.0 - 10.5 K/uL Final   RBC 10/09/2021 2.87 (A)  4.22 - 5.81 MIL/uL Final   Hemoglobin 10/09/2021 9.2 (A)  13.0 - 17.0 g/dL Final   HCT 82/42/3536 28.3 (A)  39.0 - 52.0 % Final   MCV 10/09/2021 98.6  80.0 - 100.0 fL Final   MCH 10/09/2021 32.1  26.0 - 34.0 pg Final   MCHC 10/09/2021 32.5  30.0 - 36.0 g/dL Final   RDW 14/43/1540 17.5 (A)  11.5 - 15.5 % Final   Platelets 10/09/2021 140 (A)  150 - 400 K/uL Final   nRBC 10/09/2021 0.0  0.0 - 0.2 % Final   Prothrombin Time 10/09/2021 18.9 (A)  11.4 - 15.2 seconds Final   INR 10/09/2021 1.6 (A)  0.8 - 1.2 Final   Sodium 10/09/2021 137  135 - 145 mmol/L Final   Potassium 10/09/2021 4.5  3.5 - 5.1 mmol/L Final   Chloride 10/09/2021 104  98 - 111 mmol/L Final   CO2 10/09/2021 24  22 - 32 mmol/L Final   Glucose, Bld 10/09/2021 140 (A)  70 - 99 mg/dL Final   BUN 08/67/6195 17  8 - 23 mg/dL Final   Creatinine, Ser 10/09/2021 1.51 (A)  0.61 - 1.24 mg/dL Final   Calcium 09/32/6712 9.0  8.9 - 10.3 mg/dL Final   GFR, Estimated 10/09/2021 46 (A)  >60  mL/min Final   Anion gap 10/09/2021 9  5 - 15 Final   Glucose-Capillary 10/08/2021 139 (A)  70 - 99 mg/dL Final   Glucose-Capillary 10/09/2021 132 (A)  70 - 99 mg/dL Final   Glucose-Capillary 10/09/2021 121 (A)  70 - 99 mg/dL Final   Glucose-Capillary 10/09/2021 159 (A)  70 - 99 mg/dL Final   Glucose-Capillary 10/09/2021 159 (A)  70 - 99 mg/dL Final   WBC 45/80/9983 4.6  4.0 - 10.5 K/uL Final   RBC 10/10/2021 2.87 (A)  4.22 - 5.81 MIL/uL Final   Hemoglobin 10/10/2021 9.2 (A)  13.0 - 17.0 g/dL Final   HCT 38/25/0539 27.7 (A)  39.0 - 52.0 % Final   MCV 10/10/2021 96.5  80.0 - 100.0 fL Final   MCH 10/10/2021 32.1  26.0 - 34.0 pg Final   MCHC 10/10/2021 33.2  30.0 - 36.0 g/dL Final   RDW 76/73/4193 17.2 (A)  11.5 - 15.5 % Final   Platelets 10/10/2021 140 (A)  150 - 400 K/uL Final   nRBC 10/10/2021 0.0  0.0 - 0.2 % Final   Glucose-Capillary 10/09/2021 251 (A)  70 - 99 mg/dL Final   Glucose-Capillary 10/10/2021 169 (A)  70 - 99 mg/dL Final    Imaging Studies  No results found. Medications   Scheduled  Meds:  brimonidine  1 drop Both Eyes BID   Chlorhexidine Gluconate Cloth  6 each Topical Q0600   escitalopram  10 mg Oral Daily   latanoprost  1 drop Both Eyes QHS   metoprolol succinate  25 mg Oral BID   pantoprazole  40 mg Oral Daily   simvastatin  40 mg Oral Daily   No recently discontinued medications to reconcile  LOS: 4 days   Time spent: >47min  Leeroy Bock, DO [facility] 10/10/2021, 8:05 AM   Please refer to amion to contact the [facility] Attending or Consulting provider for this pt  [email or web address] Available by Epic secure chat 7AM-7PM. If 7PM-7AM, please contact night-coverage

## 2021-10-10 NOTE — Progress Notes (Signed)
Consent was signed and Mr. Jeffrey Mills was assisted on to the side of the bed. He swallowed the video capsule without complications. This RN will return to collect equipment prior to 1700.

## 2021-10-10 NOTE — Progress Notes (Signed)
[facility]  SUBJECTIVE: Patient laying in bed, denies chest pain   Vitals:   10/09/21 2108 10/10/21 0006 10/10/21 0449 10/10/21 0850  BP: (!) 155/57 (!) 124/55 (!) 154/91 (!) 170/73  Pulse: 66 71 67 66  Resp: 17 15 17 18   Temp: 98.2 F (36.8 C) (!) 97.5 F (36.4 C) 98.2 F (36.8 C) 98.3 F (36.8 C)  TempSrc:      SpO2: 96% 100% 96% 100%  Weight:      Height:         Intake/Output Summary (Last 24 hours) at 10/10/2021 1217 Last data filed at 10/10/2021 1030 Gross per 24 hour  Intake 120 ml  Output 1080 ml  Net -960 ml       PHYSICAL EXAM  General: Well developed, well nourished, in no acute distress HEENT:  Normocephalic and atramatic Neck:  No JVD.  Lungs: Clear bilaterally to auscultation and percussion. Heart: HRRR . Normal S1 and S2 without gallops or murmurs.  Abdomen: Bowel sounds are positive, abdomen soft and non-tender  Msk:  Back normal, normal gait. Normal strength and tone for age. Extremities: No clubbing, cyanosis or edema.   Neuro: Alert and oriented X 3. Psych:  Good affect, responds appropriately   LABS: Basic Metabolic Panel: Recent Labs    10/08/21 0438 10/09/21 0700  NA 138 137  K 4.3 4.5  CL 108 104  CO2 23 24  GLUCOSE 114* 140*  BUN 19 17  CREATININE 1.44* 1.51*  CALCIUM 8.9 9.0    Liver Function Tests: No results for input(s): AST, ALT, ALKPHOS, BILITOT, PROT, ALBUMIN in the last 72 hours.  No results for input(s): LIPASE, AMYLASE in the last 72 hours. CBC: Recent Labs    10/09/21 0700 10/10/21 0433  WBC 5.3 4.6  HGB 9.2* 9.2*  HCT 28.3* 27.7*  MCV 98.6 96.5  PLT 140* 140*    Cardiac Enzymes: No results for input(s): CKTOTAL, CKMB, CKMBINDEX, TROPONINI in the last 72 hours. BNP: Invalid input(s): POCBNP D-Dimer: No results for input(s): DDIMER in the last 72 hours. Hemoglobin A1C: No results for input(s): HGBA1C in the last 72 hours.  Fasting Lipid Panel: No results for input(s): CHOL, HDL, LDLCALC,  TRIG, CHOLHDL, LDLDIRECT in the last 72 hours. Thyroid Function Tests: No results for input(s): TSH, T4TOTAL, T3FREE, THYROIDAB in the last 72 hours.  Invalid input(s): FREET3 Anemia Panel: No results for input(s): VITAMINB12, FOLATE, FERRITIN, TIBC, IRON, RETICCTPCT in the last 72 hours.  No results found.   Echo LVEF greater than 55% 07/01/2020  TELEMETRY: Atrial fibrillation 80 bpm:  ASSESSMENT AND PLAN:  Active Problems:   Symptomatic anemia   Type 2 diabetes mellitus without complication, with long-term current use of insulin (HCC)   Warfarin anticoagulation   Primary hypertension   GI bleed    Acute GI bleed with hemoglobin dropped from 10.5 to 7.2 with melena, dyspnea with minimal exertion, and positional dizziness.  The patient is on warfarin for chronic atrial fibrillation.  He has a history of GI bleed in 2020.  EGD without evidence of major source of bleeding and colonoscopy to be performed today Chronic atrial fibrillation on warfarin, and metoprolol at home for rate control Mitral valve repair in 1999 Coronary artery disease, status post CABG x4 in 1999 and stent in OM1 (02/2009) and proximal LAD (06/2011)   Recommendations  Continue to hold warfarin, which patient is taking for atrial fibrillation, not mitral valve repair. No bridge necessary.  Would recommend continuing to hold  until the patient has further work-up with GI as outpatient.  Will consider the possibility as outpatient with further discussion after that completion next week.    Hold aspirin and no particular reinstatement at this time.  Will consider the possibility of use as an outpatient if necessary Continue simvastatin No further cardiac diagnostics at this time 6.  Okay for discharged home from cardiac standpoint with follow-up next week   Lamar Blinks, MD, PhD, [facility] 10/10/2021 12:17 PM

## 2021-10-10 NOTE — Care Plan (Signed)
Video capsule deployed today. Study will not be able to be read until late tomorrow afternoon.  Merlyn Lot MD, MPH [facility] GI

## 2021-10-11 ENCOUNTER — Encounter: Payer: Self-pay | Admitting: Gastroenterology

## 2021-10-11 DIAGNOSIS — K922 Gastrointestinal hemorrhage, unspecified: Secondary | ICD-10-CM | POA: Diagnosis not present

## 2021-10-11 DIAGNOSIS — E119 Type 2 diabetes mellitus without complications: Secondary | ICD-10-CM | POA: Diagnosis not present

## 2021-10-11 DIAGNOSIS — N183 Chronic kidney disease, stage 3 unspecified: Secondary | ICD-10-CM

## 2021-10-11 DIAGNOSIS — D649 Anemia, unspecified: Secondary | ICD-10-CM | POA: Diagnosis not present

## 2021-10-11 LAB — GLUCOSE, CAPILLARY
Glucose-Capillary: 136 mg/dL — ABNORMAL HIGH (ref 70–99)
Glucose-Capillary: 172 mg/dL — ABNORMAL HIGH (ref 70–99)
Glucose-Capillary: 204 mg/dL — ABNORMAL HIGH (ref 70–99)
Glucose-Capillary: 235 mg/dL — ABNORMAL HIGH (ref 70–99)

## 2021-10-11 LAB — SURGICAL PATHOLOGY

## 2021-10-11 MED ORDER — APIXABAN 2.5 MG PO TABS: 2.5000 mg | ORAL_TABLET | Freq: Two times a day (BID) | ORAL | 0 refills | Status: AC

## 2021-10-11 NOTE — Discharge Summary (Signed)
Physician Discharge Summary  Jeffrey Mills DPO:[id number] DOB: 1936/11/10 DOA: 10/06/2021  PCP: Danella Penton, MD  Admit date: 10/06/2021 Discharge date: 10/12/2021  Admitted From: Home Disposition: Home  Recommendations for Outpatient Follow-up:  Follow up with PCP within 1-2 weeks for CBC   Physical therapy Equipment/Devices:rolling walker  Discharge Condition:stable, improved CODE STATUS:  Code Status: Prior  Regular healthy diet  Brief/Interim Summary: Pt presented with symptomatic anemia from presumed GI bleed. Initial hgb 7.2 from PCP office. His coumadin was discontinued on admission and received 2 units of pRBCs to stabilize his hemodynamics. Underwent colonoscopy, EGD and capsule study without source of bleed identified. Fortunately, patient's hgb remained stable as well as his vitals throughout the remainder of his admission. He felt improved strength after transfusion and did not endorse any more episodes of rectal bleeding/melena.  Prior to discharge, patient was started on eliquis for his Afib and discontinued coumadin.   Discharge Diagnoses:  Active Problems:   Symptomatic anemia   Type 2 diabetes mellitus without complication, with long-term current use of insulin (HCC)   Warfarin anticoagulation   Primary hypertension   GI bleed  Discharge Instructions     Discharge patient   Complete by: As directed    Discharge disposition: 06-Home-Health Care Svc   Discharge patient date: 10/11/2021   For home use only DME 4 wheeled rolling walker with seat   Complete by: As directed    Patient needs a walker to treat with the following condition: Weakness   Walker rolling   Complete by: As directed       Allergies as of 10/11/2021       Reactions   Captopril Other (See Comments)   Reaction: unknown   Morphine And Related Other (See Comments)   Reaction: unknown   Penicillins Other (See Comments)   Reaction: unknown Did it involve swelling of the  face/tongue/throat, SOB, or low BP? Unknown Did it involve sudden or severe rash/hives, skin peeling, or any reaction on the inside of your mouth or nose? Unknown Did you need to seek medical attention at a hospital or doctor's office? Unknown When did it last happen? unknown If all above answers are "NO", may proceed with cephalosporin use.        Medication List     STOP taking these medications    aspirin 81 MG tablet   clindamycin 150 MG capsule Commonly known as: CLEOCIN   diphenhydramine-acetaminophen 25-500 MG Tabs tablet Commonly known as: TYLENOL PM   furosemide 20 MG tablet Commonly known as: LASIX   mupirocin ointment 2 % Commonly known as: BACTROBAN   warfarin 5 MG tablet Commonly known as: COUMADIN       TAKE these medications    apixaban 2.5 MG Tabs tablet Commonly known as: ELIQUIS Take 1 tablet (2.5 mg total) by mouth 2 (two) times daily.   brimonidine 0.2 % ophthalmic solution Commonly known as: ALPHAGAN Place 1 drop into both eyes 2 (two) times daily.   buPROPion 150 MG 24 hr tablet Commonly known as: WELLBUTRIN XL Take 150 mg by mouth daily as needed.   escitalopram 10 MG tablet Commonly known as: LEXAPRO Take 10 mg by mouth daily.   ferrous gluconate 324 MG tablet Commonly known as: FERGON Take 1 tablet (324 mg total) by mouth daily with breakfast.   insulin lispro protamine-lispro (75-25) 100 UNIT/ML Susp injection Commonly known as: HUMALOG 75/25 MIX Inject 65-75 Units into the skin 2 (two) times daily with a meal.  Take 65 units before breakfast and 75 units before supper   insulin regular 100 units/mL injection Commonly known as: NOVOLIN R Inject into the skin 3 (three) times daily before meals. Dose per sliding Scale dose max of 10 units daily   INSULIN SYRINGE .3CC/31GX5/16" 31G X 5/16" 0.3 ML Misc by Does not apply route.   latanoprost 0.005 % ophthalmic solution Commonly known as: XALATAN Place 1 drop into both eyes at  bedtime.   LORazepam 1 MG tablet Commonly known as: ATIVAN Take 1 mg by mouth daily as needed.   meclizine 25 MG tablet Commonly known as: ANTIVERT Take 25 mg by mouth 3 (three) times daily as needed for dizziness.   metFORMIN 500 MG tablet Commonly known as: GLUCOPHAGE Take 500 mg by mouth daily with breakfast.   metoprolol succinate 25 MG 24 hr tablet Commonly known as: TOPROL-XL Take 25 mg by mouth 2 (two) times daily.   pantoprazole 40 MG tablet Commonly known as: PROTONIX Take 40 mg by mouth 2 (two) times daily.   simvastatin 40 MG tablet Commonly known as: ZOCOR Take 40 mg by mouth daily.   vitamin B-12 500 MCG tablet Commonly known as: CYANOCOBALAMIN Take 500 mcg by mouth daily.               Durable Medical Equipment  (From admission, onward)           Start     Ordered   10/11/21 0000  For home use only DME 4 wheeled rolling walker with seat       Question:  Patient needs a walker to treat with the following condition  Answer:  Weakness   10/11/21 1201            Allergies  Allergen Reactions   Captopril Other (See Comments)    Reaction: unknown   Morphine And Related Other (See Comments)    Reaction: unknown   Penicillins Other (See Comments)    Reaction: unknown Did it involve swelling of the face/tongue/throat, SOB, or low BP? Unknown Did it involve sudden or severe rash/hives, skin peeling, or any reaction on the inside of your mouth or nose? Unknown Did you need to seek medical attention at a hospital or doctor's office? Unknown When did it last happen? unknown If all above answers are "NO", may proceed with cephalosporin use.    Consultations: GI Cardiology   Procedures/Studies: [facility] Chest Port 1 View  Result Date: 10/07/2021 CLINICAL DATA:  Wheezing. EXAM: PORTABLE CHEST 1 VIEW COMPARISON:  Chest radiograph dated 11/11/2019. FINDINGS: Mild cardiomegaly with vascular congestion and mild edema. Pneumonia is not excluded.  Clinical correlation is recommended. No focal consolidation, pleural effusion or pneumothorax. Atherosclerotic calcification of the aorta. Median sternotomy wires and CABG vascular clips. No acute osseous pathology. IMPRESSION: Mild cardiomegaly with vascular congestion and mild edema. Pneumonia is not excluded. Electronically Signed   By: Elgie Collard M.D.   On: 10/07/2021 21:57    Subjective: Patient feels mild generalized weakness but overall improved since admission. Denies melena or abdominal pain. He is excited to go home.   Discharge Exam: Vitals:   10/11/21 1121 10/11/21 1416  BP: (!) 127/58 125/60  Pulse: 60 63  Resp: 18 18  Temp: 98.3 F (36.8 C) 98.1 F (36.7 C)  SpO2: 97% 97%    General: Pt is alert, awake, not in acute distress Cardiovascular: RRR, S1/S2 +, no rubs, no gallops Respiratory: CTA bilaterally, no wheezing, no rhonchi Abdominal: Soft, NT, ND, bowel  sounds + Extremities: no edema, no cyanosis  Labs: Basic Metabolic Panel: Recent Labs  Lab 10/06/21 1620 10/07/21 1212 10/08/21 0438 10/09/21 0700  NA 137 138 138 137  K 4.5 4.5 4.3 4.5  CL 110 111 108 104  CO2 19* 22 23 24   GLUCOSE 88 103* 114* 140*  BUN 23 20 19 17   CREATININE 1.52* 1.47* 1.44* 1.51*  CALCIUM 8.4* 8.5* 8.9 9.0   CBC: Recent Labs  Lab 10/06/21 1620 10/06/21 1955 10/08/21 0009 10/08/21 0438 10/08/21 0731 10/09/21 0700 10/10/21 0433  WBC 4.7  --   --  6.0  --  5.3 4.6  HGB 6.9*   < > 9.0* 8.8* 9.0* 9.2* 9.2*  HCT 22.6*   < > 27.5* 27.8* 27.7* 28.3* 27.7*  MCV 103.2*  --   --  96.9  --  98.6 96.5  PLT 135*  --   --  127*  --  140* 140*   < > = values in this interval not displayed.    Microbiology Recent Results (from the past 240 hour(s))  Resp Panel by RT-PCR (Flu A&B, Covid) Nasopharyngeal Swab     Status: None   Collection Time: 10/06/21  6:39 PM   Specimen: Nasopharyngeal Swab; Nasopharyngeal(NP) swabs in vial transport medium  Result Value Ref Range Status    SARS Coronavirus 2 by RT PCR NEGATIVE NEGATIVE Final    Comment: (NOTE) SARS-CoV-2 target nucleic acids are NOT DETECTED.  The SARS-CoV-2 RNA is generally detectable in upper respiratory specimens during the acute phase of infection. The lowest concentration of SARS-CoV-2 viral copies this assay can detect is 138 copies/mL. A negative result does not preclude SARS-Cov-2 infection and should not be used as the sole basis for treatment or other patient management decisions. A negative result may occur with  improper specimen collection/handling, submission of specimen other than nasopharyngeal swab, presence of viral mutation(s) within the areas targeted by this assay, and inadequate number of viral copies(<138 copies/mL). A negative result must be combined with clinical observations, patient history, and epidemiological information. The expected result is Negative.  Fact Sheet for Patients:  10/12/21  Fact Sheet for Healthcare Providers:  13/09/22  This test is no t yet approved or cleared by the [employer] FDA and  has been authorized for detection and/or diagnosis of SARS-CoV-2 by FDA under an Emergency Use Authorization (EUA). This EUA will remain  in effect (meaning this test can be used) for the duration of the COVID-19 declaration under Section 564(b)(1) of the Act, 21 U.S.C.section 360bbb-3(b)(1), unless the authorization is terminated  or revoked sooner.       Influenza A by PCR NEGATIVE NEGATIVE Final   Influenza B by PCR NEGATIVE NEGATIVE Final    Comment: (NOTE) The Xpert Xpress SARS-CoV-2/FLU/RSV plus assay is intended as an aid in the diagnosis of influenza from Nasopharyngeal swab specimens and should not be used as a sole basis for treatment. Nasal washings and aspirates are unacceptable for Xpert Xpress SARS-CoV-2/FLU/RSV testing.  Fact Sheet for  Patients: [employer]  Fact Sheet for Healthcare Providers: [place]  This test is not yet approved or cleared by the [employer] FDA and has been authorized for detection and/or diagnosis of SARS-CoV-2 by FDA under an Emergency Use Authorization (EUA). This EUA will remain in effect (meaning this test can be used) for the duration of the COVID-19 declaration under Section 564(b)(1) of the Act, 21 U.S.C. section 360bbb-3(b)(1), unless the authorization is terminated or revoked.  Performed at [employer]  [facility], [address] [place], [place] [address]   MRSA Next Gen by PCR, Nasal     Status: None   Collection Time: 10/07/21 12:50 AM   Specimen: Nasal Mucosa; Nasal Swab  Result Value Ref Range Status   MRSA by PCR Next Gen NOT DETECTED NOT DETECTED Final    Comment: (NOTE) The GeneXpert MRSA Assay (FDA approved for NASAL specimens only), is one component of a comprehensive MRSA colonization surveillance program. It is not intended to diagnose MRSA infection nor to guide or monitor treatment for MRSA infections. Test performance is not FDA approved in patients less than 62 years old. Performed at [facility], [address] [place], [place] [address]     Time coordinating discharge: Over 30 minutes  Leeroy Bock, MD  [facility] 10/12/2021, 1:06 PM Pager   If 7PM-7AM, please contact night-coverage [email or web address] Password [email or web address]

## 2021-10-11 NOTE — Care Management Important Message (Signed)
Important Message  Patient Details  Name: Jeffrey Mills MRN: [id number] Date of Birth: 03/30/1936   Medicare Important Message Given:  Yes     Olegario Messier A Laryn Venning 10/11/2021, 12:25 PM

## 2021-10-11 NOTE — Evaluation (Signed)
Physical Therapy Evaluation Patient Details Name: Jeffrey Mills MRN: [id number] DOB: 07/16/1936 Today's Date: 10/11/2021  History of Present Illness  85 y.o. male with a PMH significant for A. fib on Coumadin, iron deficiency anemia due to chronic blood loss, HLD, type II DM, CAD s/p PCI with stenting, CKDIII, severe mitral regurgitation, prior MI, mitral valve annuloplasty who presented to the ED on 10/06/2021 with acute decline in hemoglobin from 11-7.2 in the setting of melena, DOE, positive orthostatics and fatigue. In the ED, it was found that they had hemoglobin 6.9.   Clinical Impression  Pt received sitting in recliner, pleasant and agreeable to therapy. He reports using a SPC for community ambulation however he does not typically use it in the house. Pt presents with impaired dynamic standing balance witnessed during Tinetti balance assessment (1 LOB) as well as during gait with a SPC (3 LOB). PT then assessed gait with RW in which pt was able to ambulate with SUP and no LOB. He did report fatigue after ambulating. PT is recommending pt d/c home and follow up with outpatient PT to address deficits in balance and endurance. PT is also recommending a RW for the patient and has instructed him to use it at all times, within the home and community, upon d/c.         Recommendations for follow up therapy are one component of a multi-disciplinary discharge planning process, led by the attending physician.  Recommendations may be updated based on patient status, additional functional criteria and insurance authorization.  Follow Up Recommendations Outpatient PT    Assistance Recommended at Discharge Intermittent Supervision/Assistance  Functional Status Assessment Patient has had a recent decline in their functional status and demonstrates the ability to make significant improvements in function in a reasonable and predictable amount of time.  Equipment Recommendations  Rolling walker (2  wheels)    Recommendations for Other Services       Precautions / Restrictions Precautions Precautions: Fall Restrictions Weight Bearing Restrictions: No      Mobility  Bed Mobility               General bed mobility comments: NT, up in recliner    Transfers Overall transfer level: Needs assistance Equipment used: None Transfers: Sit to/from Stand Sit to Stand: Supervision           General transfer comment: 3x from recliner with BUE use, no LOB upon rising.    Ambulation/Gait Ambulation/Gait assistance: Min guard Gait Distance (Feet): 200 Feet Assistive device: Straight cane Gait Pattern/deviations: Step-through pattern Gait velocity: decreased     General Gait Details: Trial SPC vs RW, 214ft each - CGA with 3 instances of steadying required (SPC); SUP with RW. Both during dual-tasking as pt held a conversation.  Stairs            Wheelchair Mobility    Modified Rankin (Stroke Patients Only)       Balance Overall balance assessment: Needs assistance;History of Falls Sitting-balance support: No upper extremity supported;Feet supported Sitting balance-Leahy Scale: Good     Standing balance support: No upper extremity supported Standing balance-Leahy Scale: Poor Standing balance comment: Static standing [place] assessed - pt endured LOB while turning 360 degrres to the left (PT steadyed and pt reached for window sill). Pt also endured 3 LOB during ambulation using SPC. Pt was safest with SUP and no LOB while ambulating with RW.             High level balance activites:  Turns High Level Balance Comments: LOB turning to the left             Pertinent Vitals/Pain Pain Assessment: No/denies pain    Home Living Family/patient expects to be discharged to:: Private residence Living Arrangements: Alone Available Help at Discharge: Family;Available PRN/intermittently (daughter very involved, calls daily, provides most meals) Type of  Home: House Home Access: Stairs to enter Entrance Stairs-Rails: Left Entrance Stairs-Number of Steps: 5 (preferred entrance); also has 3 STE with B railing   Home Layout: One level Home Equipment: Shower seat;Cane - single point      Prior Function Prior Level of Function : Driving;History of Falls (last six months);Needs assist       Physical Assist : ADLs (physical)   ADLs (physical): IADLs Mobility Comments: pt endorses using SPC for longer distances/community but reports doctors have told him to use a SPC consistently; 2 falls/69mo 2/2 LOB ADLs Comments: Dtr provides most meals, pt does light meal prep at home, indep with med mgt, ADL     Hand Dominance        Extremity/Trunk Assessment   Upper Extremity Assessment Upper Extremity Assessment: Overall WFL for tasks assessed    Lower Extremity Assessment Lower Extremity Assessment: Overall WFL for tasks assessed (5/5 BLE)    Cervical / Trunk Assessment Cervical / Trunk Assessment: Normal  Communication   Communication: No difficulties  Cognition Arousal/Alertness: Awake/alert Behavior During Therapy: WFL for tasks assessed/performed Overall Cognitive Status: Within Functional Limits for tasks assessed                                          General Comments      Exercises Other Exercises Other Exercises: Pt instructed in ADL transfer training, falls prevention, use of AD/DME For ADL and ADL mobility to improve safety   Assessment/Plan    PT Assessment Patient needs continued PT services  PT Problem List Decreased safety awareness;Decreased activity tolerance;Decreased balance       PT Treatment Interventions Therapeutic activities;Gait training;Therapeutic exercise;Patient/family education;Stair training;Balance training;Neuromuscular re-education    PT Goals (Current goals can be found in the Care Plan section)  Acute Rehab PT Goals Patient Stated Goal: to walk longer distances without  the walker PT Goal Formulation: With patient Time For Goal Achievement: 10/25/21 Potential to Achieve Goals: Good    Frequency Min 2X/week   Barriers to discharge        Co-evaluation               AM-PAC PT "6 Clicks" Mobility  Outcome Measure Help needed turning from your back to your side while in a flat bed without using bedrails?: None Help needed moving from lying on your back to sitting on the side of a flat bed without using bedrails?: None Help needed moving to and from a bed to a chair (including a wheelchair)?: A Little Help needed standing up from a chair using your arms (e.g., wheelchair or bedside chair)?: A Little Help needed to walk in hospital room?: A Little Help needed climbing 3-5 steps with a railing? : A Little 6 Click Score: 20    End of Session Equipment Utilized During Treatment: Gait belt Activity Tolerance: Patient tolerated treatment well Patient left: in chair;with call bell/phone within reach Nurse Communication: Mobility status PT Visit Diagnosis: Unsteadiness on feet (R26.81);History of falling (Z91.81);Difficulty in walking, not elsewhere classified (R26.2)  Time: 8850-2774 PT Time Calculation (min) (ACUTE ONLY): 37 min   Charges:   PT Evaluation $PT Eval Moderate Complexity: 1 Mod PT Treatments $Gait Training: 8-22 mins $Therapeutic Activity: 8-22 mins       Basilia Jumbo PT, DPT 10/11/21 11:00 AM [phone]

## 2021-10-11 NOTE — Care Plan (Signed)
VCE reviewed and no sources of bleeding identified. Given negative EGD, Colonoscopy, and VCE there is no contraindication for restarting anticoagulation if indicated. Will need to be on PO iron and have outpatient follow-up.  Please call with any questions or concerns.  Merlyn Lot MD, MPH [facility] GI

## 2021-10-11 NOTE — Progress Notes (Signed)
Patient is being discharged home today. He has family coming to pick him up. He is being discharged with a walker. He is starting Apixaban after this discharge.

## 2021-10-11 NOTE — Discharge Instructions (Signed)
Your warfarin has been discontinued. Instead, please take eliquis twice daily. You do not need to get your INR checked on this blood thinner.  There was no bleed identified on the capsule study.  Please follow up with your primary doctor to monitor your hemoglobin at earliest appointment. Please take daily iron supplement.

## 2021-10-11 NOTE — TOC Initial Note (Signed)
Transition of Care [facility]) - Initial/Assessment Note    Patient Details  Name: Jeffrey Mills MRN: [id number] Date of Birth: 1936/04/12  [facility] of [facility]) CM/SW Contact:    Alberteen Sam, LCSW Phone Number: 10/11/2021, 12:11 PM  Clinical Narrative:                  CSW met with patient at bedside to review dc today, agreeable to outpatient PT instead of [place]. Prefers to come to [facility] main campus for outpatient PT. Also requests walker, which has been ordered through [employer] and will be delivered to room shortly. Patient reports his family will pick him up today. Referral sent to outpatient PT at [facility] at [phone].   NO other discharge needs identified at this time.   Expected Discharge Plan: Home/Self Care Barriers to Discharge: No Barriers Identified   Patient Goals and CMS Choice Patient states their goals for this hospitalization and ongoing recovery are:: to go home CMS Medicare.gov Compare Post Acute Care list provided to:: Patient Choice offered to / list presented to : Patient  Expected Discharge Plan and Services Expected Discharge Plan: Home/Self Care         Expected Discharge Date: 10/11/21               DME Arranged: Gilford Rile DME Agency: [employer] Date DME Agency Contacted: 10/11/21 Time DME Agency Contacted: 1211 Representative spoke with at DME Agency: Freda Munro            Prior Living Arrangements/Services   Lives with:: Self                   Activities of Daily Living Home Assistive Devices/Equipment: Cane (specify quad or straight) ADL Screening (condition at time of admission) Patient's cognitive ability adequate to safely complete daily activities?: Yes Is the patient deaf or have difficulty hearing?: No Does the patient have difficulty seeing, even when wearing glasses/contacts?: No Does the patient have difficulty concentrating, remembering, or making decisions?: No Patient able to express need for assistance with ADLs?: Yes Does  the patient have difficulty dressing or bathing?: No Independently performs ADLs?: Yes (appropriate for developmental age) Does the patient have difficulty walking or climbing stairs?: No Weakness of Legs: None Weakness of Arms/Hands: None  Permission Sought/Granted                  Emotional Assessment       Orientation: : Oriented to Self, Oriented to Place, Oriented to  Time, Oriented to Situation Alcohol / Substance Use: Not Applicable Psych Involvement: No (comment)  Admission diagnosis:  GI bleed [K92.2] Warfarin anticoagulation [Z79.01] Symptomatic anemia [D64.9] Gastrointestinal hemorrhage, unspecified gastrointestinal hemorrhage type [K92.2] Type 2 diabetes mellitus without complication, with long-term current use of insulin ([place]) [E11.9, Z79.4] Patient Active Problem List   Diagnosis Date Noted   GI bleed 10/06/2021   Hypersomnia 08/17/2020   Uncontrolled type 2 diabetes mellitus with hyperglycemia ([place]) 08/17/2020   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   Symptomatic anemia 11/08/2019   Type 2 diabetes mellitus without complication, with long-term current use of insulin ([place]) 11/08/2019   CKD (chronic kidney disease) stage 3, GFR 30-59 ml/min ([place]) 11/08/2019   History of melena 11/08/2019   Acute on chronic diastolic CHF (congestive heart failure) ([place]) 11/08/2019   Subtherapeutic international normalized ratio (INR) 11/08/2019   Atrial fibrillation, chronic ([place]) 11/08/2019   Warfarin anticoagulation 11/08/2019   Acute kidney injury superimposed on CKD ([place])    Melena  B12 deficiency 12/21/2017   Medicare annual wellness visit, initial 04/19/2017   Mitral regurgitation 03/20/2017   Chronic painful diabetic neuropathy ([place]) 08/16/2016   Depression, major, recurrent, in partial remission ([place]) 04/11/2016   Macroalbuminuric diabetic nephropathy ([place]) 12/08/2015   Combined hyperlipidemia 11/17/2014   Primary hypertension 08/11/2014   CAD (coronary  artery disease), autologous vein bypass graft 08/11/2014   CAD (coronary artery disease) 03/26/2014   PVD (peripheral vascular disease) ([place]) 03/26/2014   PCP:  Rusty Aus, MD Pharmacy:   [id number] [address], [place] AT [address] [place] [address] Phone: [phone] Fax: [phone]     Social Determinants of Health (SDOH) Interventions    Readmission Risk Interventions No flowsheet data found.

## 2021-10-11 NOTE — Progress Notes (Signed)
          [facility] REFERRAL        Occupational Therapy * Physical Therapy * Speech Therapy                           DATE : 10/11/21       PATIENT NAME: Jeffrey Mills              PATIENT MRN: [id number]       DIAGNOSIS/DIAGNOSIS CODE : K92.2 GI Bleed                DATE OF DISCHARGE: 10/11/21       PRIMARY CARE PHYSICIAN : Bethann Punches, MD        Dear Provider (Name:  [facility] Fax: [phone]):   I certify that I have examined this patient and that occupational/physical/speech therapy is necessary on an outpatient basis.    The patient has expressed interest in completing their recommended course of therapy at your  location.  Once a formal order from the patient's primary care physician has been obtained, please  contact him/her to schedule an appointment for evaluation at your earliest convenience.   [X]   Physical Therapy Evaluate and Treat          [  ]  Occupational Therapy Evaluate and Treat                                                             [  ]  Speech Therapy Evaluate and Treat         The patient's primary care physician (listed above) must furnish and be responsible for a formal order such that the recommended services may be furnished while under the primary physician's care, and that the plan of care will be established and reviewed every 30 days (or more often if condition necessitates).   ________note cosigned below_________________________________________                             ____11/14/22________                                Hospitalist/Attending Physician Signature  NOTE:  Diagnosis and signature are required to validate order                 Date                                                  _____________Chelsey 10/13/21, MD____________ Printed Hospitalist/Attending Physician Name

## 2021-10-11 NOTE — Evaluation (Signed)
Occupational Therapy Evaluation Patient Details Name: Jeffrey Mills MRN: [id number] DOB: 14-Jun-1936 Today's Date: 10/11/2021   History of Present Illness 85 y.o. male with a PMH significant for A. fib on Coumadin, iron deficiency anemia due to chronic blood loss, HLD, type II DM, CAD s/p PCI with stenting, CKDIII, severe mitral regurgitation, prior MI, mitral valve annuloplasty who presented to the ED on 10/06/2021 with acute decline in hemoglobin from 11-7.2 in the setting of melena, DOE, positive orthostatics and fatigue. In the ED, it was found that they had hemoglobin 6.9.   Clinical Impression   Pt was seen for OT evaluation this date. Prior to hospital admission, pt was modified indep with basic ADL, taking seated showers, driving, and performing light meal prep. Pt endorses sometimes falling asleep suddenly so is fearful of heavy meal prep. Daughter calls daily and provides pt with most meals. Pt endorses 2 falls in past 62mo 2/2 LOB. Currently pt demonstrates impairments in balance and activity tolerance as described below (See OT problem list) which functionally limit his ability to perform ADL/self-care tasks. Pt currently requires SBA-CGA for ADL transfer and ADL in standing. Pt at high risk of falls. Pt instructed in ADL transfer training, falls prevention, use of AD/DME For ADL and ADL mobility to improve safety. Pt demo's improved balance with use of RW for ADL mobility versus no AD. Pt reports doctors have told him previously to use a SPC consistently but pt admits he only uses for longer distances and community mobility. Pt would benefit from skilled OT services to address noted impairments and functional limitations (see below for any additional details) in order to maximize safety and independence while minimizing falls risk and caregiver burden. Do not anticipate skilled OT needs upon discharge at this time, so long as family can maintain current level of support provided and pt is more  consistent with AD use for ADL/mobility to maximize safety.    Recommendations for follow up therapy are one component of a multi-disciplinary discharge planning process, led by the attending physician.  Recommendations may be updated based on patient status, additional functional criteria and insurance authorization.   Follow Up Recommendations  No OT follow up    Assistance Recommended at Discharge Intermittent Supervision/Assistance  Functional Status Assessment  Patient has had a recent decline in their functional status and demonstrates the ability to make significant improvements in function in a reasonable and predictable amount of time.  Equipment Recommendations  None recommended by OT    Recommendations for Other Services       Precautions / Restrictions Precautions Precautions: Fall Restrictions Weight Bearing Restrictions: No      Mobility Bed Mobility               General bed mobility comments: NT, up in recliner    Transfers Overall transfer level: Needs assistance Equipment used: None Transfers: Sit to/from Stand Sit to Stand: Min guard                  Balance Overall balance assessment: Needs assistance;History of Falls Sitting-balance support: No upper extremity supported;Feet supported Sitting balance-Leahy Scale: Good     Standing balance support: No upper extremity supported Standing balance-Leahy Scale: Fair Standing balance comment: intermittent UE support required for stability while marching in place without AD. Balance much improved with use of RW for ADL mobility  ADL either performed or assessed with clinical judgement   ADL                                         General ADL Comments: Pt able to doff/don socks seated in recliner with figure 4 technique without assist. CGA for ADL transfers, PRN VC for ECS. Supervision level for seated bathing/dressing for safety. SBA-CGA  for standing ADL Tasks 2/2 impaired balance     Vision         Perception     Praxis      Pertinent Vitals/Pain Pain Assessment: No/denies pain     Hand Dominance     Extremity/Trunk Assessment Upper Extremity Assessment Upper Extremity Assessment: Overall WFL for tasks assessed   Lower Extremity Assessment Lower Extremity Assessment: Defer to PT evaluation   Cervical / Trunk Assessment Cervical / Trunk Assessment: Normal   Communication Communication Communication: No difficulties   Cognition Arousal/Alertness: Awake/alert Behavior During Therapy: WFL for tasks assessed/performed Overall Cognitive Status: Within Functional Limits for tasks assessed                                       General Comments       Exercises Other Exercises Other Exercises: Pt instructed in ADL transfer training, falls prevention, use of AD/DME For ADL and ADL mobility to improve safety   Shoulder Instructions      Home Living Family/patient expects to be discharged to:: Private residence Living Arrangements: Alone Available Help at Discharge: Family;Available PRN/intermittently (daughter very involved, calls daily, provides most meals) Type of Home: House Home Access: Stairs to enter [employer] of Steps: 5 Entrance Stairs-Rails: Left Home Layout: One level     Bathroom Shower/Tub: Producer, television/film/video: Handicapped height     Home Equipment: Production assistant, radio - single point          Prior Functioning/Environment Prior Level of Function : Driving;History of Falls (last six months);Needs assist       Physical Assist : ADLs (physical)   ADLs (physical): IADLs Mobility Comments: pt endorses using SPC for longer distances/outside the home but reports doctors have told him to use a SPC consistently; 2 falls/89mo 2/2 LOB ADLs Comments: Dtr provides most meals, pt does light meal  prep at home, indep with med mgt, ADL        OT  Problem List: Impaired balance (sitting and/or standing);Decreased knowledge of use of DME or AE;Decreased activity tolerance      OT Treatment/Interventions: Self-care/ADL training;Therapeutic exercise;Therapeutic activities;Energy conservation;DME and/or AE instruction;Patient/family education;Balance training    OT Goals(Current goals can be found in the care plan section) Acute Rehab OT Goals Patient Stated Goal: go home OT Goal Formulation: With patient Time For Goal Achievement: 10/25/21 Potential to Achieve Goals: Good ADL Goals Pt Will Perform Lower Body Dressing: with modified independence;sit to/from stand Pt Will Transfer to Toilet: with modified independence;ambulating (elevated commode, LRAD for amb) Additional ADL Goal #1: Pt will perform daily morning ADL routine with modified independence, LRAD for amb.  OT Frequency: Min 2X/week   Barriers to D/C:            Co-evaluation              AM-PAC OT "6 Clicks" Daily Activity  Outcome Measure Help from another person eating meals?: None Help from another person taking care of personal grooming?: None Help from another person toileting, which includes using toliet, bedpan, or urinal?: A Little Help from another person bathing (including washing, rinsing, drying)?: A Little Help from another person to put on and taking off regular upper body clothing?: None Help from another person to put on and taking off regular lower body clothing?: A Little 6 Click Score: 21   End of Session Equipment Utilized During Treatment: Gait belt;Rolling walker (2 wheels)  Activity Tolerance: Patient tolerated treatment well Patient left: in chair;with call bell/phone within reach  OT Visit Diagnosis: History of falling (Z91.81);Unsteadiness on feet (R26.81)                Time: 8144-8185 OT Time Calculation (min): 23 min Charges:  OT General Charges $OT Visit: 1 Visit OT Evaluation $OT Eval Low Complexity: 1 Low OT  Treatments $Self Care/Home Management : 8-22 mins  Arman Filter., MPH, MS, OTR/L ascom [phone] 10/11/21, 10:11 AM

## 2021-10-12 ENCOUNTER — Encounter: Payer: Self-pay | Admitting: Gastroenterology

## 2021-11-12 ENCOUNTER — Other Ambulatory Visit: Payer: Self-pay | Admitting: Student in an Organized Health Care Education/Training Program

## 2022-03-23 ENCOUNTER — Ambulatory Visit
Admission: RE | Admit: 2022-03-23 | Discharge: 2022-03-23 | Disposition: A | Discharge status: Home / Self Care | Payer: Medicare Other | Source: Ambulatory Visit | Attending: Physician Assistant | Admitting: Physician Assistant

## 2022-03-23 ENCOUNTER — Other Ambulatory Visit: Payer: Self-pay | Admitting: Physician Assistant

## 2022-03-23 DIAGNOSIS — I700 Atherosclerosis of aorta: Secondary | ICD-10-CM | POA: Diagnosis not present

## 2022-03-23 DIAGNOSIS — M7989 Other specified soft tissue disorders: Secondary | ICD-10-CM | POA: Diagnosis present

## 2022-03-23 DIAGNOSIS — R911 Solitary pulmonary nodule: Secondary | ICD-10-CM | POA: Insufficient documentation

## 2022-03-23 LAB — POCT I-STAT CREATININE: Creatinine, Ser: 1.8 mg/dL — ABNORMAL HIGH (ref 0.61–1.24)

## 2022-03-23 MED ORDER — IOHEXOL 300 MG/ML  SOLN
75.0000 mL | Freq: Once | INTRAMUSCULAR | Status: AC | PRN
  Administered 2022-03-23: 60 mL via INTRAVENOUS

## 2022-03-25 ENCOUNTER — Inpatient Hospital Stay: Payer: Medicare Other

## 2022-03-25 ENCOUNTER — Emergency Department
Admission: EM | Admit: 2022-03-25 | Discharge: 2022-03-25 | Disposition: A | Discharge status: Home / Self Care | Payer: Medicare Other | Attending: Emergency Medicine | Admitting: Emergency Medicine

## 2022-03-25 ENCOUNTER — Other Ambulatory Visit: Payer: Self-pay

## 2022-03-25 ENCOUNTER — Inpatient Hospital Stay: Payer: Medicare Other | Attending: Oncology | Admitting: Oncology

## 2022-03-25 ENCOUNTER — Encounter: Payer: Self-pay | Admitting: Oncology

## 2022-03-25 VITALS — BP 124/69 | HR 81 | Temp 98.8°F | Ht 70.0 in | Wt 232.0 lb

## 2022-03-25 DIAGNOSIS — N189 Chronic kidney disease, unspecified: Secondary | ICD-10-CM

## 2022-03-25 DIAGNOSIS — Z803 Family history of malignant neoplasm of breast: Secondary | ICD-10-CM | POA: Diagnosis not present

## 2022-03-25 DIAGNOSIS — K869 Disease of pancreas, unspecified: Secondary | ICD-10-CM

## 2022-03-25 DIAGNOSIS — G893 Neoplasm related pain (acute) (chronic): Secondary | ICD-10-CM

## 2022-03-25 DIAGNOSIS — R911 Solitary pulmonary nodule: Secondary | ICD-10-CM | POA: Diagnosis not present

## 2022-03-25 DIAGNOSIS — I509 Heart failure, unspecified: Secondary | ICD-10-CM | POA: Diagnosis not present

## 2022-03-25 DIAGNOSIS — Z79899 Other long term (current) drug therapy: Secondary | ICD-10-CM | POA: Diagnosis not present

## 2022-03-25 DIAGNOSIS — R0789 Other chest pain: Secondary | ICD-10-CM | POA: Diagnosis present

## 2022-03-25 DIAGNOSIS — Z9181 History of falling: Secondary | ICD-10-CM

## 2022-03-25 DIAGNOSIS — I482 Chronic atrial fibrillation, unspecified: Secondary | ICD-10-CM

## 2022-03-25 DIAGNOSIS — R591 Generalized enlarged lymph nodes: Secondary | ICD-10-CM

## 2022-03-25 DIAGNOSIS — I2510 Atherosclerotic heart disease of native coronary artery without angina pectoris: Secondary | ICD-10-CM | POA: Diagnosis not present

## 2022-03-25 DIAGNOSIS — I110 Hypertensive heart disease with heart failure: Secondary | ICD-10-CM | POA: Insufficient documentation

## 2022-03-25 DIAGNOSIS — R5383 Other fatigue: Secondary | ICD-10-CM | POA: Diagnosis not present

## 2022-03-25 DIAGNOSIS — D649 Anemia, unspecified: Secondary | ICD-10-CM | POA: Diagnosis not present

## 2022-03-25 DIAGNOSIS — E119 Type 2 diabetes mellitus without complications: Secondary | ICD-10-CM | POA: Insufficient documentation

## 2022-03-25 DIAGNOSIS — Z7189 Other specified counseling: Secondary | ICD-10-CM

## 2022-03-25 DIAGNOSIS — R52 Pain, unspecified: Secondary | ICD-10-CM

## 2022-03-25 LAB — COMPREHENSIVE METABOLIC PANEL
ALT: 17 U/L (ref 0–44)
AST: 59 U/L — ABNORMAL HIGH (ref 15–41)
Albumin: 3.6 g/dL (ref 3.5–5.0)
Alkaline Phosphatase: 130 U/L — ABNORMAL HIGH (ref 38–126)
Anion gap: 6 (ref 5–15)
BUN: 26 mg/dL — ABNORMAL HIGH (ref 8–23)
CO2: 22 mmol/L (ref 22–32)
Calcium: 8.6 mg/dL — ABNORMAL LOW (ref 8.9–10.3)
Chloride: 104 mmol/L (ref 98–111)
Creatinine, Ser: 1.66 mg/dL — ABNORMAL HIGH (ref 0.61–1.24)
GFR, Estimated: 40 mL/min — ABNORMAL LOW (ref >60)
Glucose, Bld: 171 mg/dL — ABNORMAL HIGH (ref 70–99)
Potassium: 4.1 mmol/L (ref 3.5–5.1)
Sodium: 132 mmol/L — ABNORMAL LOW (ref 135–145)
Total Bilirubin: 0.9 mg/dL (ref 0.3–1.2)
Total Protein: 7.7 g/dL (ref 6.5–8.1)

## 2022-03-25 LAB — CBC WITH DIFFERENTIAL/PLATELET
Abs Immature Granulocytes: 0.08 10*3/uL — ABNORMAL HIGH (ref 0.00–0.07)
Abs Immature Granulocytes: 0.1 10*3/uL — ABNORMAL HIGH (ref 0.00–0.07)
Basophils Absolute: 0 10*3/uL (ref 0.0–0.1)
Basophils Absolute: 0 10*3/uL (ref 0.0–0.1)
Basophils Relative: 0 %
Basophils Relative: 1 %
Eosinophils Absolute: 0.1 10*3/uL (ref 0.0–0.5)
Eosinophils Absolute: 0.2 10*3/uL (ref 0.0–0.5)
Eosinophils Relative: 2 %
Eosinophils Relative: 3 %
HCT: 30.2 % — ABNORMAL LOW (ref 39.0–52.0)
HCT: 29.5 % — ABNORMAL LOW (ref 39.0–52.0)
Hemoglobin: 10.1 g/dL — ABNORMAL LOW (ref 13.0–17.0)
Hemoglobin: 10.2 g/dL — ABNORMAL LOW (ref 13.0–17.0)
Immature Granulocytes: 2 %
Immature Granulocytes: 2 %
Lymphocytes Relative: 22 %
Lymphocytes Relative: 18 %
Lymphs Abs: 1.1 10*3/uL (ref 0.7–4.0)
Lymphs Abs: 1 10*3/uL (ref 0.7–4.0)
MCH: 34.8 pg — ABNORMAL HIGH (ref 26.0–34.0)
MCH: 36 pg — ABNORMAL HIGH (ref 26.0–34.0)
MCHC: 33.4 g/dL (ref 30.0–36.0)
MCHC: 34.6 g/dL (ref 30.0–36.0)
MCV: 104.1 fL — ABNORMAL HIGH (ref 80.0–100.0)
MCV: 104.2 fL — ABNORMAL HIGH (ref 80.0–100.0)
Monocytes Absolute: 0.5 10*3/uL (ref 0.1–1.0)
Monocytes Absolute: 0.6 10*3/uL (ref 0.1–1.0)
Monocytes Relative: 9 %
Monocytes Relative: 10 %
Neutro Abs: 3.4 10*3/uL (ref 1.7–7.7)
Neutro Abs: 3.8 10*3/uL (ref 1.7–7.7)
Neutrophils Relative %: 65 %
Neutrophils Relative %: 66 %
Platelets: 120 10*3/uL — ABNORMAL LOW (ref 150–400)
Platelets: 142 10*3/uL — ABNORMAL LOW (ref 150–400)
RBC: 2.9 MIL/uL — ABNORMAL LOW (ref 4.22–5.81)
RBC: 2.83 MIL/uL — ABNORMAL LOW (ref 4.22–5.81)
RDW: 14 % (ref 11.5–15.5)
RDW: 14.1 % (ref 11.5–15.5)
WBC: 5.3 10*3/uL (ref 4.0–10.5)
WBC: 5.7 10*3/uL (ref 4.0–10.5)
nRBC: 0 % (ref 0.0–0.2)
nRBC: 0 % (ref 0.0–0.2)

## 2022-03-25 LAB — BASIC METABOLIC PANEL
Anion gap: 5 (ref 5–15)
BUN: 25 mg/dL — ABNORMAL HIGH (ref 8–23)
CO2: 23 mmol/L (ref 22–32)
Calcium: 8.8 mg/dL — ABNORMAL LOW (ref 8.9–10.3)
Chloride: 107 mmol/L (ref 98–111)
Creatinine, Ser: 1.57 mg/dL — ABNORMAL HIGH (ref 0.61–1.24)
GFR, Estimated: 43 mL/min — ABNORMAL LOW (ref >60)
Glucose, Bld: 184 mg/dL — ABNORMAL HIGH (ref 70–99)
Potassium: 5 mmol/L (ref 3.5–5.1)
Sodium: 135 mmol/L (ref 135–145)

## 2022-03-25 LAB — TECHNOLOGIST SMEAR REVIEW
Plt Morphology: ADEQUATE
RBC MORPHOLOGY: NORMAL
WBC MORPHOLOGY: NORMAL

## 2022-03-25 LAB — HEPATITIS PANEL, ACUTE
HCV Ab: NONREACTIVE
Hep A IgM: NONREACTIVE
Hep B C IgM: NONREACTIVE
Hepatitis B Surface Ag: NONREACTIVE

## 2022-03-25 LAB — RETIC PANEL
Immature Retic Fract: 25 % — ABNORMAL HIGH (ref 2.3–15.9)
RBC.: 2.81 MIL/uL — ABNORMAL LOW (ref 4.22–5.81)
Retic Count, Absolute: 66.9 10*3/uL (ref 19.0–186.0)
Retic Ct Pct: 2.4 % (ref 0.4–3.1)
Reticulocyte Hemoglobin: 36.5 pg (ref >27.9)

## 2022-03-25 LAB — HIV ANTIBODY (ROUTINE TESTING W REFLEX): HIV Screen 4th Generation wRfx: NONREACTIVE

## 2022-03-25 LAB — TROPONIN I (HIGH SENSITIVITY): Troponin I (High Sensitivity): 15 ng/L (ref <18)

## 2022-03-25 LAB — IRON AND TIBC
Iron: 62 ug/dL (ref 45–182)
Saturation Ratios: 18 % (ref 17.9–39.5)
TIBC: 339 ug/dL (ref 250–450)
UIBC: 277 ug/dL

## 2022-03-25 LAB — LACTATE DEHYDROGENASE: LDH: 1951 U/L — ABNORMAL HIGH (ref 98–192)

## 2022-03-25 LAB — FERRITIN: Ferritin: 198 ng/mL (ref 24–336)

## 2022-03-25 MED ORDER — OXYCODONE HCL 5 MG PO TABS
5.0000 mg | ORAL_TABLET | Freq: Once | ORAL | Status: AC
  Administered 2022-03-25: 5 mg via ORAL
  Filled 2022-03-25: qty 1

## 2022-03-25 MED ORDER — ACETAMINOPHEN 500 MG PO TABS
1000.0000 mg | ORAL_TABLET | Freq: Once | ORAL | Status: AC
  Administered 2022-03-25: 1000 mg via ORAL
  Filled 2022-03-25: qty 2

## 2022-03-25 MED ORDER — OXYCODONE HCL 5 MG PO TABS
5.0000 mg | ORAL_TABLET | Freq: Three times a day (TID) | ORAL | 0 refills | Status: DC | PRN
Start: 2022-03-25 — End: 2022-03-26

## 2022-03-25 MED ORDER — SENNA 8.6 MG PO TABS: 1.0000 | ORAL_TABLET | Freq: Every day | ORAL | 0 refills | Status: AC

## 2022-03-25 NOTE — ED Provider Notes (Signed)
? ?[facility] ?Provider Note ? ? ? Event Date/Time  ? First MD Initiated Contact with Patient 03/25/22 [id number]   ?  (approximate) ? ? ?History  ? ?Back Pain and Arm Pain (Right ) ? ? ?HPI ? ?Jeffrey Mills is a 86 y.o. male with a history of anemia, hypertension, CHF, CAD, diabetes, hyperlipidemia, peripheral vascular disease who presents for evaluation of pain.  Patient reports that this problem started 4 weeks ago.  Patient had a mechanical fall.  About a week later he developed a bruised area on the right axilla which he thought it was a bruise from the fall.  A week after that he started having pain in the area became enlarged.  He went to see his primary care doctor yesterday and was sent for a CT scan which is concerning for possible metastatic malignancy/lymphoma.  He was given tramadol for pain.  Patient reports that he took 1 dose of tramadol last night and 1 tonight together with 1000 mg of Tylenol.  The pain continued to become worse and then he started having pain on his left arm as well.  With a history of heart disease that made him concerned.  At this time he is complaining of 3 out of 10 pain located at the site of the mass on the right chest wall.  No chest pain, no fever or chills, no shortness of breath.  Patient is scheduled to undergo a biopsy of the mass later today ?  ? ? ?Past Medical History:  ?Diagnosis Date  ? Anemia   ? Anxiety   ? Arthritis   ? Benign hypertension   ? Cardiopulmonary arrest (HCC)   ? CHF (congestive heart failure) (HCC)   ? Coronary atherosclerosis of native coronary artery   ? Depression   ? Diabetes (HCC)   ? Glaucoma   ? Hyperlipidemia   ? Mitral valve disorder   ? Neuralgia   ? PVD (peripheral vascular disease) (HCC)   ? ? ?Past Surgical History:  ?Procedure Laterality Date  ? CHOLECYSTECTOMY    ? COLONOSCOPY WITH PROPOFOL N/A 11/11/2019  ? Procedure: COLONOSCOPY WITH PROPOFOL;  Surgeon: Toney Reil, MD;  Location: [facility] ENDOSCOPY;   Service: Gastroenterology;  Laterality: N/A;  ? COLONOSCOPY WITH PROPOFOL N/A 10/09/2021  ? Procedure: COLONOSCOPY WITH PROPOFOL;  Surgeon: Regis Bill, MD;  Location: [facility] ENDOSCOPY;  Service: Endoscopy;  Laterality: N/A;  ? CORONARY ANGIOPLASTY WITH STENT PLACEMENT    ? CORONARY ARTERY BYPASS GRAFT    ? ESOPHAGOGASTRODUODENOSCOPY (EGD) WITH PROPOFOL N/A 11/11/2019  ? Procedure: ESOPHAGOGASTRODUODENOSCOPY (EGD) WITH PROPOFOL;  Surgeon: Toney Reil, MD;  Location: [facility] ENDOSCOPY;  Service: Gastroenterology;  Laterality: N/A;  ? ESOPHAGOGASTRODUODENOSCOPY (EGD) WITH PROPOFOL N/A 10/08/2021  ? Procedure: ESOPHAGOGASTRODUODENOSCOPY (EGD) WITH PROPOFOL;  Surgeon: Jaynie Collins, DO;  Location: [facility] ENDOSCOPY;  Service: Gastroenterology;  Laterality: N/A;  ? GIVENS CAPSULE STUDY N/A 10/10/2021  ? Procedure: GIVENS CAPSULE STUDY;  Surgeon: Regis Bill, MD;  Location: [facility] ENDOSCOPY;  Service: Endoscopy;  Laterality: N/A;  ? MITRAL VALVE ANNULOPLASTY    ? TEE WITHOUT CARDIOVERSION N/A 12/05/2018  ? Procedure: TRANSESOPHAGEAL ECHOCARDIOGRAM (TEE);  Surgeon: Dalia Heading, MD;  Location: [facility] ORS;  Service: Cardiovascular;  Laterality: N/A;  ? ? ? ?Physical Exam  ? ?Triage Vital Signs: ?ED Triage Vitals  ?Enc Vitals Group  ?   BP 03/25/22 0054 113/66  ?   Pulse Rate 03/25/22 0054 83  ?   Resp 03/25/22  0054 20  ?   Temp 03/25/22 0054 98.9 ?F (37.2 ?C)  ?   Temp Source 03/25/22 0054 Oral  ?   SpO2 03/25/22 0054 94 %  ?   Weight 03/25/22 0107 234 lb (106.1 kg)  ?   Height 03/25/22 0107 5\' 10"  (1.778 m)  ?   Head Circumference --   ?   Peak Flow --   ?   Pain Score 03/25/22 0107 8  ?   Pain Loc --   ?   Pain Edu? --   ?   Excl. in GC? --   ? ? ?Most recent vital signs: ?Vitals:  ? 03/25/22 0330 03/25/22 0400  ?BP: 137/69 123/63  ?Pulse: 78 73  ?Resp:  18  ?Temp:    ?SpO2: 96% 99%  ? ? ? ?Constitutional: Alert and oriented. Well appearing and in no apparent distress. ?HEENT: ?     Head:  Normocephalic and atraumatic.    ?     Eyes: Conjunctivae are normal. Sclera is non-icteric.  ?     Mouth/Throat: Mucous membranes are moist.  ?     Neck: Supple with no signs of meningismus. ?Cardiovascular: Regular rate and rhythm. No murmurs, gallops, or rubs. 2+ symmetrical distal pulses are present in all extremities.  ?Respiratory: Normal respiratory effort. Lungs are clear to auscultation bilaterally.  ?Chest wall: There is a large, non mobile mass located on the R axilla with no overlying bruising, the mass is tender with palpation. No lymphadenopathy on the L axilla or b/l groin ?Gastrointestinal: Soft, non tender, and non distended with positive bowel sounds. No rebound or guarding. ?Genitourinary: No CVA tenderness. ?Musculoskeletal:  No edema, cyanosis, or erythema of extremities. ?Neurologic: Normal speech and language. Face is symmetric. Moving all extremities. No gross focal neurologic deficits are appreciated. ?Skin: Skin is warm, dry and intact. No rash noted. ?Psychiatric: Mood and affect are normal. Speech and behavior are normal. ? ?ED Results / Procedures / Treatments  ? ?Labs ?(all labs ordered are listed, but only abnormal results are displayed) ?Labs Reviewed  ?CBC WITH DIFFERENTIAL/PLATELET - Abnormal; Notable for the following components:  ?    Result Value  ? RBC 2.90 (*)   ? Hemoglobin 10.1 (*)   ? HCT 30.2 (*)   ? MCV 104.1 (*)   ? MCH 34.8 (*)   ? Platelets 120 (*)   ? Abs Immature Granulocytes 0.08 (*)   ? All other components within normal limits  ?BASIC METABOLIC PANEL - Abnormal; Notable for the following components:  ? Glucose, Bld 184 (*)   ? BUN 25 (*)   ? Creatinine, Ser 1.57 (*)   ? Calcium 8.8 (*)   ? GFR, Estimated 43 (*)   ? All other components within normal limits  ?TROPONIN I (HIGH SENSITIVITY)  ? ? ? ?EKG ? ?ED ECG REPORT ?I, 03/27/22, the attending physician, personally viewed and interpreted this ECG. ? ?Sinus rhythm with a rate of 76, normal intervals, normal  axis, no ST elevations or depression ? ?RADIOLOGY ?none ? ? ?PROCEDURES: ? ?Critical Care performed: No ? ?Procedures ? ? ? ?IMPRESSION / MDM / ASSESSMENT AND PLAN / ED COURSE  ?I reviewed the triage vital signs and the nursing notes. ? ?86 y.o. male with a history of anemia, hypertension, CHF, CAD, diabetes, hyperlipidemia, peripheral vascular disease who presents for evaluation of pain.  Patient had a CT scan done yesterday by his primary care doctor for this mass in his  right axilla that is the reason of his pain.  I reviewed the results of the CT which seems to show a bulky heterogeneous soft tissue mass of the right axilla with enlarged lymph nodes concerning for neoplasm and also signs of metastatic disease with lesions seen at the pleura, distant lymph nodes, and liver.  On exam patient has a large solid hard mass that is not mobile on the right axilla and tender to palpation with no overlying erythema, warmth, or bruising.  Patient was also concerned that he was having some left arm pain at home therefore we will get EKG, troponin, and basic labs.  Patient took tramadol at home and is currently complaining of 3 out of 10 pain.  We will give a gram of Tylenol and 5 of oxycodone.  History is gathered from patient and his daughter who is at bedside.  Plan discussed with both of them. ? ?MEDICATIONS GIVEN IN ED: ?Medications  ?acetaminophen (TYLENOL) tablet 1,000 mg (1,000 mg Oral Given 03/25/22 0346)  ?oxyCODONE (Oxy IR/ROXICODONE) immediate release tablet 5 mg (5 mg Oral Given 03/25/22 0346)  ? ? ? ?ED COURSE: Patient denies any pain after Tylenol and oxycodone.  EKG and troponin with no signs of ischemia.  No indication for repeat troponin since patient's left arm pain was greater than 4 hours from when blood was drawn.  Labs showing mild stable anemia, no leukocytosis.  No significant electrolyte derangements.  We will send a prescription for oxycodone.  Recommended 1000 mg of Tylenol and 5 mg of oxycodone  every 8 hours for pain as needed and Senokot to prevent constipation.  Recommended that they do not miss the appointment for the biopsy this afternoon.  Discussed my standard return precautions and close follow-up with

## 2022-03-25 NOTE — Discharge Instructions (Signed)
Pain control: Take tylenol 1000mg every 8 hours. Take 5mg of oxycodone every 6 hours for breakthrough pain. If you need the oxycodone make sure to take one senokot as well to prevent constipation.  Do not drink alcohol, drive or participate in any other potentially dangerous activities while taking this medication as it may make you sleepy. Do not take this medication with any other sedating medications, either prescription or over-the-counter.  

## 2022-03-25 NOTE — ED Triage Notes (Signed)
Pt presents to ER with daughter with c/o right side pain under his right arm. Pt seen by PCP a few days ago for same and CT scan showed some possible lymphoma.  Pt had fall several weeks ago and states pain began after that.  Pt is here tonight for pain control.  Hematoma noted to right side of chest that has been present since his fall around 4 weeks ago.  Pt states pain is going behind upper back, which is new in the past week.  Pt otherwise A&O x4 at this time.  Pt appears uncomfortable in triage.  Pt rx tramadol at home, but states it has not been helping him tonight.   ?

## 2022-03-26 DIAGNOSIS — Z7189 Other specified counseling: Secondary | ICD-10-CM | POA: Insufficient documentation

## 2022-03-26 MED ORDER — OXYCODONE HCL 5 MG PO TABS: 5.0000 mg | ORAL_TABLET | Freq: Three times a day (TID) | ORAL | 0 refills | Status: AC | PRN

## 2022-03-26 NOTE — Addendum Note (Signed)
Addended by: Rickard Patience on: 03/26/2022 10:24 PM ? ? Modules accepted: Orders ? ?

## 2022-03-26 NOTE — Progress Notes (Addendum)
?Hematology/Oncology Consult note ?[phone] [phone] ?  ? ?   ? ? ?Patient Care Team: ?Rusty Aus, MD as PCP - General (Internal Medicine) ? ?REFERRING PROVIDER: ?Leonel Ramsay, MD  ?CHIEF COMPLAINTS/REASON FOR VISIT:  ?Evaluation of lymphadenopathy ? ?HISTORY OF PRESENTING ILLNESS:  ? ?Jeffrey Mills is a  86 y.o.  male with PMH listed below was seen in consultation at the request of  Leonel Ramsay, MD  for evaluation of lymphadenopathy ? ?Patient was accompanied by her daughter. ?Patient had a fall 4 weeks ago and noticed the right axillary mass.  Area is tender Venofer.  Patient has endorses fatigue, no significant unintentional weight loss, night sweats, fever.  Patient initially takes tramadol which did not help his pain.  He went to emergency room on 03/24/2022 and was prescribed oxycodone.  He had 1 dose of oxycodone which significantly improved his pain.  He has not filled the prescription yet. ?03/23/2022, CT chest with contrast showed  ?Bulky heterogeneous soft tissue of the right axilla which likely represents conglomerate lymph nodes, soft tissue surrounding the ?right internal mammary vessels, and enlarged bilateral supraclavicular lymph nodes, findings are highly concerning for neoplasm. Favor lymphoma, metastatic disease is an additional consideration. ?Pleural nodule of the right lung apex, concerning for pleural metastatic disease. ?Prominent subcentimeter prevascular lymph nodes, concerning for additional site of disease. ?Indeterminate hypoattenuating lesion of the right hepatic lobe,concerning for liver metastatic disease. Could be further evaluated ?with contrast-enhanced liver MRI if this would change clinical management. Aortic Atherosclerosis  ? ?Patient was referred to establish care with hematology oncology.  Currently her pain level is 4. ? ?Review of Systems  ?Constitutional:  Negative for chills and fever.  ?HENT:   Negative for hearing loss  and voice change.   ?Eyes:  Negative for eye problems and icterus.  ?Respiratory:  Negative for chest tightness, cough and shortness of breath.   ?Cardiovascular:  Negative for chest pain and leg swelling.  ?Gastrointestinal:  Negative for abdominal distention and abdominal pain.  ?Endocrine: Negative for hot flashes.  ?Genitourinary:  Negative for difficulty urinating, dysuria and frequency.   ?Musculoskeletal:  Negative for arthralgias.  ?     Right axillary mass  ?Skin:  Negative for itching and rash.  ?Neurological:  Negative for light-headedness and numbness.  ?Hematological:  Negative for adenopathy. Does not bruise/bleed easily.  ?Psychiatric/Behavioral:  Negative for confusion.   ? ?MEDICAL HISTORY:  ?Past Medical History:  ?Diagnosis Date  ? Anemia   ? Anxiety   ? Arthritis   ? Benign hypertension   ? Cardiopulmonary arrest ([place])   ? CHF (congestive heart failure) ([place])   ? Coronary atherosclerosis of native coronary artery   ? Depression   ? Diabetes ([place])   ? Glaucoma   ? Hyperlipidemia   ? Mitral valve disorder   ? Neuralgia   ? PVD (peripheral vascular disease) ([place])   ? ? ?SURGICAL HISTORY: ?Past Surgical History:  ?Procedure Laterality Date  ? CHOLECYSTECTOMY    ? COLONOSCOPY WITH PROPOFOL N/A 11/11/2019  ? Procedure: COLONOSCOPY WITH PROPOFOL;  Surgeon: Lin Landsman, MD;  Location: [facility] ENDOSCOPY;  Service: Gastroenterology;  Laterality: N/A;  ? COLONOSCOPY WITH PROPOFOL N/A 10/09/2021  ? Procedure: COLONOSCOPY WITH PROPOFOL;  Surgeon: Lesly Rubenstein, MD;  Location: [facility] ENDOSCOPY;  Service: Endoscopy;  Laterality: N/A;  ? CORONARY ANGIOPLASTY WITH STENT PLACEMENT    ? CORONARY ARTERY BYPASS GRAFT    ? ESOPHAGOGASTRODUODENOSCOPY (EGD) WITH PROPOFOL N/A 11/11/2019  ?  Procedure: ESOPHAGOGASTRODUODENOSCOPY (EGD) WITH PROPOFOL;  Surgeon: Lin Landsman, MD;  Location: [facility] ENDOSCOPY;  Service: Gastroenterology;  Laterality: N/A;  ? ESOPHAGOGASTRODUODENOSCOPY (EGD) WITH PROPOFOL N/A  10/08/2021  ? Procedure: ESOPHAGOGASTRODUODENOSCOPY (EGD) WITH PROPOFOL;  Surgeon: Annamaria Helling, DO;  Location: [facility] ENDOSCOPY;  Service: Gastroenterology;  Laterality: N/A;  ? GIVENS CAPSULE STUDY N/A 10/10/2021  ? Procedure: GIVENS CAPSULE STUDY;  Surgeon: Lesly Rubenstein, MD;  Location: [facility] ENDOSCOPY;  Service: Endoscopy;  Laterality: N/A;  ? MITRAL VALVE ANNULOPLASTY    ? TEE WITHOUT CARDIOVERSION N/A 12/05/2018  ? Procedure: TRANSESOPHAGEAL ECHOCARDIOGRAM (TEE);  Surgeon: Teodoro Spray, MD;  Location: [facility] ORS;  Service: Cardiovascular;  Laterality: N/A;  ? ? ?SOCIAL HISTORY: ?Social History  ? ?Socioeconomic History  ? Marital status: Widowed  ?  Spouse name: Not on file  ? Number of children: 2  ? Years of education: Not on file  ? Highest education level: Not on file  ?Occupational History  ? Occupation: retired  ?Tobacco Use  ? Smoking status: Never  ? Smokeless tobacco: Never  ?Vaping Use  ? Vaping Use: Never used  ?Substance and Sexual Activity  ? Alcohol use: No  ? Drug use: No  ? Sexual activity: Not Currently  ?Other Topics Concern  ? Not on file  ?Social History Narrative  ? Not on file  ? ?Social Determinants of Health  ? ?Financial Resource Strain: Not on file  ?Food Insecurity: Not on file  ?Transportation Needs: Not on file  ?Physical Activity: Not on file  ?Stress: Not on file  ?Social Connections: Not on file  ?Intimate Partner Violence: Not on file  ? ? ?FAMILY HISTORY: ?Family History  ?Problem Relation Age of Onset  ? Heart disease Father   ? ALS Father   ? Hypertension Mother   ? Breast cancer Mother   ? Heart attack Paternal Uncle   ? Diabetes Son   ? Heart disease Son   ? ? ?ALLERGIES:  is allergic to captopril, morphine and related, and penicillins. ? ?MEDICATIONS:  ?Current Outpatient Medications  ?Medication Sig Dispense Refill  ? apixaban (ELIQUIS) 2.5 MG TABS tablet Take 1 tablet (2.5 mg total) by mouth 2 (two) times daily. 60 tablet 0  ? brimonidine (ALPHAGAN) 0.2 %  ophthalmic solution Place 1 drop into both eyes 2 (two) times daily.    ? buPROPion (WELLBUTRIN XL) 150 MG 24 hr tablet Take 150 mg by mouth daily as needed.    ? escitalopram (LEXAPRO) 10 MG tablet Take 10 mg by mouth daily.     ? ferrous gluconate (FERGON) 324 MG tablet Take 1 tablet (324 mg total) by mouth daily with breakfast.  3  ? insulin lispro protamine-lispro (HUMALOG 75/25 MIX) (75-25) 100 UNIT/ML SUSP injection Inject 65-75 Units into the skin 2 (two) times daily with a meal. Take 65 units before breakfast and 75 units before supper    ? insulin regular (NOVOLIN R) 100 units/mL injection Inject into the skin 3 (three) times daily before meals. Dose per sliding Scale dose max of 10 units daily    ? Insulin Syringe-Needle U-100 (INSULIN SYRINGE .3CC/31GX5/16") 31G X 5/16" 0.3 ML MISC by Does not apply route.    ? latanoprost (XALATAN) 0.005 % ophthalmic solution Place 1 drop into both eyes at bedtime.     ? LORazepam (ATIVAN) 1 MG tablet Take 1 mg by mouth daily as needed.    ? meclizine (ANTIVERT) 25 MG tablet Take 25 mg by mouth 3 (  three) times daily as needed for dizziness.    ? metFORMIN (GLUCOPHAGE) 500 MG tablet Take 500 mg by mouth daily with breakfast.    ? metoprolol succinate (TOPROL-XL) 25 MG 24 hr tablet Take 25 mg by mouth 2 (two) times daily.     ? oxyCODONE (ROXICODONE) 5 MG immediate release tablet Take 1 tablet (5 mg total) by mouth every 8 (eight) hours as needed. 20 tablet 0  ? pantoprazole (PROTONIX) 40 MG tablet Take 40 mg by mouth 2 (two) times daily.     ? senna (SENOKOT) 8.6 MG TABS tablet Take 1 tablet (8.6 mg total) by mouth daily. 120 tablet 0  ? simvastatin (ZOCOR) 40 MG tablet Take 40 mg by mouth daily.    ? vitamin B-12 (CYANOCOBALAMIN) 500 MCG tablet Take 500 mcg by mouth daily.    ? ?No current facility-administered medications for this visit.  ? ? ? ?PHYSICAL EXAMINATION: ?ECOG PERFORMANCE STATUS: 2 - Symptomatic, <50% confined to bed ?Vitals:  ? 03/25/22 1240  ?BP: 124/69   ?Pulse: 81  ?Temp: 98.8 ?F (37.1 ?C)  ? ?Filed Weights  ? 03/25/22 1240  ?Weight: 232 lb (105.2 kg)  ? ? ?Physical Exam ?Constitutional:   ?   General: He is not in acute distress. ?   Appearance: He is obese.

## 2022-03-28 LAB — KAPPA/LAMBDA LIGHT CHAINS
Kappa free light chain: 122 mg/L — ABNORMAL HIGH (ref 3.3–19.4)
Kappa, lambda light chain ratio: 1.59 (ref 0.26–1.65)
Lambda free light chains: 76.7 mg/L — ABNORMAL HIGH (ref 5.7–26.3)

## 2022-03-28 LAB — COMP PANEL: LEUKEMIA/LYMPHOMA

## 2022-03-29 ENCOUNTER — Encounter: Payer: Self-pay | Admitting: Oncology

## 2022-03-30 ENCOUNTER — Telehealth: Payer: Self-pay | Admitting: *Deleted

## 2022-03-30 ENCOUNTER — Other Ambulatory Visit: Payer: Self-pay | Admitting: Oncology

## 2022-03-30 LAB — MULTIPLE MYELOMA PANEL, SERUM
Albumin SerPl Elph-Mcnc: 3.2 g/dL (ref 2.9–4.4)
Albumin/Glob SerPl: 0.9 (ref 0.7–1.7)
Alpha 1: 0.3 g/dL (ref 0.0–0.4)
Alpha2 Glob SerPl Elph-Mcnc: 0.8 g/dL (ref 0.4–1.0)
B-Globulin SerPl Elph-Mcnc: 1.1 g/dL (ref 0.7–1.3)
Gamma Glob SerPl Elph-Mcnc: 1.4 g/dL (ref 0.4–1.8)
Globulin, Total: 3.7 g/dL (ref 2.2–3.9)
IgA: 545 mg/dL — ABNORMAL HIGH (ref 61–437)
IgG (Immunoglobin G), Serum: 1333 mg/dL (ref 603–1613)
IgM (Immunoglobulin M), Srm: 205 mg/dL — ABNORMAL HIGH (ref 15–143)
Total Protein ELP: 6.9 g/dL (ref 6.0–8.5)

## 2022-03-30 MED ORDER — ALPRAZOLAM 0.5 MG PO TABS: 0.5000 mg | ORAL_TABLET | ORAL | 0 refills | Status: AC

## 2022-03-30 NOTE — Telephone Encounter (Signed)
BMB order on 4/28. I have faxed form to get BMB scheduled for STAT. Will contact patient to inform of appt as soon as IR gets this scheduled.  ?

## 2022-03-30 NOTE — Telephone Encounter (Signed)
Dr. Tasia Catchings has sent in script for Xanax and patients daughter is already aware. ?

## 2022-03-30 NOTE — Telephone Encounter (Signed)
Jeffrey Mills called reporting that patient is having PET scan and is claustrophobic and was instructed by PET people to call and have [place] send prescription for claustrophobia for him to take before his scan. Please advise ?

## 2022-03-31 ENCOUNTER — Ambulatory Visit: Payer: Medicare Other

## 2022-03-31 ENCOUNTER — Other Ambulatory Visit: Payer: Self-pay

## 2022-03-31 DIAGNOSIS — R591 Generalized enlarged lymph nodes: Secondary | ICD-10-CM

## 2022-04-05 ENCOUNTER — Ambulatory Visit: Admission: RE | Admit: 2022-04-05 | Payer: Medicare Other | Source: Ambulatory Visit

## 2022-04-05 ENCOUNTER — Encounter: Payer: Self-pay | Admitting: Emergency Medicine

## 2022-04-05 ENCOUNTER — Other Ambulatory Visit: Payer: Self-pay

## 2022-04-05 ENCOUNTER — Emergency Department
Admission: EM | Admit: 2022-04-05 | Discharge: 2022-04-05 | Disposition: A | Discharge status: Home / Self Care | Payer: Medicare Other | Attending: Emergency Medicine | Admitting: Emergency Medicine

## 2022-04-05 DIAGNOSIS — E119 Type 2 diabetes mellitus without complications: Secondary | ICD-10-CM

## 2022-04-05 DIAGNOSIS — I509 Heart failure, unspecified: Secondary | ICD-10-CM | POA: Insufficient documentation

## 2022-04-05 DIAGNOSIS — I2510 Atherosclerotic heart disease of native coronary artery without angina pectoris: Secondary | ICD-10-CM | POA: Insufficient documentation

## 2022-04-05 DIAGNOSIS — I130 Hypertensive heart and chronic kidney disease with heart failure and stage 1 through stage 4 chronic kidney disease, or unspecified chronic kidney disease: Secondary | ICD-10-CM | POA: Diagnosis not present

## 2022-04-05 DIAGNOSIS — R079 Chest pain, unspecified: Secondary | ICD-10-CM | POA: Diagnosis present

## 2022-04-05 DIAGNOSIS — D649 Anemia, unspecified: Secondary | ICD-10-CM | POA: Insufficient documentation

## 2022-04-05 DIAGNOSIS — E1122 Type 2 diabetes mellitus with diabetic chronic kidney disease: Secondary | ICD-10-CM | POA: Insufficient documentation

## 2022-04-05 DIAGNOSIS — R222 Localized swelling, mass and lump, trunk: Secondary | ICD-10-CM | POA: Diagnosis not present

## 2022-04-05 DIAGNOSIS — Z794 Long term (current) use of insulin: Secondary | ICD-10-CM | POA: Insufficient documentation

## 2022-04-05 DIAGNOSIS — N189 Chronic kidney disease, unspecified: Secondary | ICD-10-CM | POA: Insufficient documentation

## 2022-04-05 DIAGNOSIS — R2231 Localized swelling, mass and lump, right upper limb: Secondary | ICD-10-CM

## 2022-04-05 DIAGNOSIS — R531 Weakness: Secondary | ICD-10-CM

## 2022-04-05 LAB — URINALYSIS, ROUTINE W REFLEX MICROSCOPIC
Bilirubin Urine: NEGATIVE
Glucose, UA: NEGATIVE mg/dL
Hgb urine dipstick: NEGATIVE
Ketones, ur: NEGATIVE mg/dL
Leukocytes,Ua: NEGATIVE
Nitrite: NEGATIVE
Protein, ur: NEGATIVE mg/dL
Specific Gravity, Urine: 1.02 (ref 1.005–1.030)
pH: 5 (ref 5.0–8.0)

## 2022-04-05 LAB — BASIC METABOLIC PANEL
Anion gap: 11 (ref 5–15)
BUN: 20 mg/dL (ref 8–23)
CO2: 21 mmol/L — ABNORMAL LOW (ref 22–32)
Calcium: 8.5 mg/dL — ABNORMAL LOW (ref 8.9–10.3)
Chloride: 103 mmol/L (ref 98–111)
Creatinine, Ser: 1.39 mg/dL — ABNORMAL HIGH (ref 0.61–1.24)
GFR, Estimated: 50 mL/min — ABNORMAL LOW (ref >60)
Glucose, Bld: 273 mg/dL — ABNORMAL HIGH (ref 70–99)
Potassium: 4.2 mmol/L (ref 3.5–5.1)
Sodium: 135 mmol/L (ref 135–145)

## 2022-04-05 LAB — CBC
HCT: 26.6 % — ABNORMAL LOW (ref 39.0–52.0)
Hemoglobin: 8.8 g/dL — ABNORMAL LOW (ref 13.0–17.0)
MCH: 33.8 pg (ref 26.0–34.0)
MCHC: 33.1 g/dL (ref 30.0–36.0)
MCV: 102.3 fL — ABNORMAL HIGH (ref 80.0–100.0)
Platelets: 133 10*3/uL — ABNORMAL LOW (ref 150–400)
RBC: 2.6 MIL/uL — ABNORMAL LOW (ref 4.22–5.81)
RDW: 14.3 % (ref 11.5–15.5)
WBC: 5.2 10*3/uL (ref 4.0–10.5)
nRBC: 0 % (ref 0.0–0.2)

## 2022-04-05 LAB — CBG MONITORING, ED: Glucose-Capillary: 208 mg/dL — ABNORMAL HIGH (ref 70–99)

## 2022-04-05 NOTE — ED Triage Notes (Signed)
Pt presents to the ed with c/o firm mass under right arm. Pt reports PCP is aware and scheduled for PET scan tomorrow. Pt reports he came to the ed because he is unable to care for self and increasing weakness. Pt reports numbness in right arm makes it hard for him to give himself insulin.  ?

## 2022-04-05 NOTE — ED Notes (Signed)
First nurse note-pt brought in via ems from home with weakness.  Pt has lymph nodes on right side of chest.  Fsbs 338 per ems iv right hand 20g with 250cc bolus of saline.   Bp124/60, oxygen sats 95%.  Pt in recliner in lobby.   ?

## 2022-04-05 NOTE — ED Provider Notes (Signed)
? ?[facility] ?Provider Note ? ? ? Event Date/Time  ? First MD Initiated Contact with Patient 04/05/22 2111   ?  (approximate) ? ? ?History  ? ?Weakness ? ? ?HPI ? ?Jeffrey Mills is a 86 y.o. male with a history of CAD, CHF, CKD, diabetes on insulin who comes ED complaining of right-sided chest pain in the area of a firm mass in his right axilla.  He reports being found to have a possible lymphoma in that area, and was seen by Dr. Cathie Hoops of oncology who is arranging for him to have a PET scan and a biopsy.  No other chest pain, no pleuritic pain.  No shortness of breath.  No dizziness or syncope or palpitations.  No vomiting or black or bloody stool.  Eating normally. ? ?He is also complaining of generalized fatigue and sleepiness.  Dr. Cathie Hoops did prescribe oxycodone 5 mg for him to take every 8 hours as needed ?  ? ? ?Physical Exam  ? ?Triage Vital Signs: ?ED Triage Vitals  ?Enc Vitals Group  ?   BP 04/05/22 1807 (!) 124/50  ?   Pulse Rate 04/05/22 1807 93  ?   Resp 04/05/22 1807 13  ?   Temp 04/05/22 1807 98.8 ?F (37.1 ?C)  ?   Temp src --   ?   SpO2 04/05/22 1807 94 %  ?   Weight 04/05/22 1808 232 lb (105.2 kg)  ?   Height 04/05/22 1808 5\' 10"  (1.778 m)  ?   Head Circumference --   ?   Peak Flow --   ?   Pain Score 04/05/22 1808 7  ?   Pain Loc --   ?   Pain Edu? --   ?   Excl. in GC? --   ? ? ?Most recent vital signs: ?Vitals:  ? 04/05/22 2200 04/05/22 2230  ?BP: 114/63 133/70  ?Pulse: 87 90  ?Resp: 14 19  ?Temp:    ?SpO2: 97% 96%  ? ? ? ?General: Awake, no distress.  ?CV:  Good peripheral perfusion.  Regular rate and rhythm ?Resp:  Normal effort.  Clear to auscultation bilaterally ?Abd:  No distention.  Soft and nontender ?Other:  Right chest wall with large firm irregular mass extending from the axilla.  This area is mildly tender.  No inflammatory changes, no signs of infection.  No open wounds. ? ? ?ED Results / Procedures / Treatments  ? ?Labs ?(all labs ordered are listed, but only  abnormal results are displayed) ?Labs Reviewed  ?BASIC METABOLIC PANEL - Abnormal; Notable for the following components:  ?    Result Value  ? CO2 21 (*)   ? Glucose, Bld 273 (*)   ? Creatinine, Ser 1.39 (*)   ? Calcium 8.5 (*)   ? GFR, Estimated 50 (*)   ? All other components within normal limits  ?CBC - Abnormal; Notable for the following components:  ? RBC 2.60 (*)   ? Hemoglobin 8.8 (*)   ? HCT 26.6 (*)   ? MCV 102.3 (*)   ? Platelets 133 (*)   ? All other components within normal limits  ?URINALYSIS, ROUTINE W REFLEX MICROSCOPIC - Abnormal; Notable for the following components:  ? Color, Urine YELLOW (*)   ? APPearance CLEAR (*)   ? All other components within normal limits  ?CBG MONITORING, ED - Abnormal; Notable for the following components:  ? Glucose-Capillary 208 (*)   ? All other components within  normal limits  ? ? ? ?EKG ? ?Interpreted by me ?Normal sinus rhythm rate of 92.  Normal axis and intervals.  Normal QRS ST segments and T waves.  No ischemic changes. ? ? ?RADIOLOGY ?CT chest from March 23, 2022 reviewed and interpreted by me, no evidence of pneumonia.  There is bulky soft tissue mass in the right axilla.  Radiology report reviewed. ? ? ? ?PROCEDURES: ? ?Critical Care performed: No ? ?Procedures ? ? ?MEDICATIONS ORDERED IN ED: ?Medications - No data to display ? ? ?IMPRESSION / MDM / ASSESSMENT AND PLAN / ED COURSE  ?I reviewed the triage vital signs and the nursing notes. ?             ?               ? ?Differential diagnosis includes, but is not limited to, dehydration, AKI, anemia, oxycodone side effect, cancer related fatigue ? ? ? ?Patient presents with generalized weakness, without focal symptoms.  He is tolerating oral intake and taking his medications.  Lab panel is unremarkable with stable mild anemia, stable CKD, electrolytes unconcerning at this time.  Urinalysis shows no sign of infection. ? ?I suspect his symptoms are due to oxycodone side effects.  He is taking 5 mg twice daily  essentially, and I recommended decreasing morning dose to half tablet to see if this improves energy level.  I cautioned the patient and his family member at bedside that he may need to take another half tablet midday to maintain adequate pain relief. ? ?Doubt ACS PE dissection pneumonia or sepsis.  Vital signs are normal.  Does not require hospitalization. ?  ? ? ?FINAL CLINICAL IMPRESSION(S) / ED DIAGNOSES  ? ?Final diagnoses:  ?Generalized weakness  ?Mass of right axilla  ?Type 2 diabetes mellitus without complication, with long-term current use of insulin (HCC)  ? ? ? ?Rx / DC Orders  ? ?ED Discharge Orders   ? ? None  ? ?  ? ? ? ?Note:  This document was prepared using Dragon voice recognition software and may include unintentional dictation errors. ?  ?Sharman Cheek, MD ?04/05/22 2327 ? ?

## 2022-04-06 ENCOUNTER — Telehealth: Payer: Self-pay

## 2022-04-06 ENCOUNTER — Encounter: Admission: RE | Admit: 2022-04-06 | Payer: Medicare Other | Source: Ambulatory Visit

## 2022-04-06 NOTE — Telephone Encounter (Signed)
Per Coralee Rud, Radiology, they are unable to do PET scan today because they don't have the dose. Informed, Raynelle Fanning (daughter) of biopsy date and for pt to hold eliquis 48 hours prior to procedure. Last day to take Eliquis will be on Friday 5/12. Raynelle Fanning verbalized understanding. She is requesting for PET scan to be r/s to next week.  ? ?Please r/s PET to next week, per Julie's request. Please call pt/ julie with new appt details.  ?

## 2022-04-06 NOTE — Telephone Encounter (Signed)
Called patient's daughter to follow up with patient going to ER. We received message via secure chat stating that patient wasn't feeling well and is very anxious. He was not wanting to go to his PET scan today. Called daughter to encourage that patient keep his appt. Also, informed her that we received clearance from Cardiologist and working on getting his US guided biopsy scheduled ASAP. Advised to call back. ?

## 2022-04-11 ENCOUNTER — Ambulatory Visit: Admission: RE | Admit: 2022-04-11 | Payer: Medicare Other | Source: Ambulatory Visit | Admitting: Radiology

## 2022-04-13 ENCOUNTER — Inpatient Hospital Stay: Payer: Medicare Other | Attending: Oncology

## 2022-04-14 ENCOUNTER — Ambulatory Visit: Admission: RE | Admit: 2022-04-14 | Payer: Medicare Other | Source: Ambulatory Visit

## 2022-04-28 DEATH — deceased
